# Patient Record
Sex: Female | Born: 1945
Health system: Southern US, Community
[De-identification: ages and names within clinical notes are randomized; demographics above are authoritative.]

## PROBLEM LIST (undated history)

## (undated) DIAGNOSIS — C439 Malignant melanoma of skin, unspecified: Secondary | ICD-10-CM

## (undated) DIAGNOSIS — R918 Other nonspecific abnormal finding of lung field: Secondary | ICD-10-CM

## (undated) DIAGNOSIS — S2239XA Fracture of one rib, unspecified side, initial encounter for closed fracture: Secondary | ICD-10-CM

## (undated) DIAGNOSIS — Z8679 Personal history of other diseases of the circulatory system: Secondary | ICD-10-CM

## (undated) DIAGNOSIS — I10 Essential (primary) hypertension: Secondary | ICD-10-CM

## (undated) DIAGNOSIS — M949 Disorder of cartilage, unspecified: Secondary | ICD-10-CM

## (undated) DIAGNOSIS — M545 Low back pain: Secondary | ICD-10-CM

## (undated) DIAGNOSIS — M79671 Pain in right foot: Secondary | ICD-10-CM

## (undated) DIAGNOSIS — B262 Mumps encephalitis: Secondary | ICD-10-CM

## (undated) DIAGNOSIS — E876 Hypokalemia: Secondary | ICD-10-CM

## (undated) DIAGNOSIS — C50919 Malignant neoplasm of unspecified site of unspecified female breast: Secondary | ICD-10-CM

## (undated) DIAGNOSIS — R748 Abnormal levels of other serum enzymes: Secondary | ICD-10-CM

## (undated) DIAGNOSIS — G4700 Insomnia, unspecified: Secondary | ICD-10-CM

## (undated) DIAGNOSIS — Z9189 Other specified personal risk factors, not elsewhere classified: Secondary | ICD-10-CM

## (undated) DIAGNOSIS — Z8619 Personal history of other infectious and parasitic diseases: Secondary | ICD-10-CM

## (undated) DIAGNOSIS — M899 Disorder of bone, unspecified: Secondary | ICD-10-CM

## (undated) DIAGNOSIS — G43009 Migraine without aura, not intractable, without status migrainosus: Secondary | ICD-10-CM

## (undated) DIAGNOSIS — Z853 Personal history of malignant neoplasm of breast: Secondary | ICD-10-CM

## (undated) DIAGNOSIS — M79672 Pain in left foot: Secondary | ICD-10-CM

## (undated) DIAGNOSIS — IMO0002 Reserved for concepts with insufficient information to code with codable children: Secondary | ICD-10-CM

## (undated) HISTORY — PX: TONSILLECTOMY: SHX5217

## (undated) HISTORY — PX: SHOULDER ARTHROSCOPY: SHX128

## (undated) HISTORY — DX: Mumps encephalitis: B26.2

## (undated) HISTORY — DX: Essential (primary) hypertension: I10

## (undated) HISTORY — DX: Personal history of other infectious and parasitic diseases: Z86.19

## (undated) HISTORY — DX: Other nonspecific abnormal finding of lung field: R91.8

## (undated) HISTORY — DX: Insomnia, unspecified: G47.00

## (undated) HISTORY — DX: Low back pain: M54.5

## (undated) HISTORY — DX: Malignant melanoma of skin, unspecified: C43.9

## (undated) HISTORY — DX: Pain in right foot: M79.671

## (undated) HISTORY — DX: Fracture of one rib, unspecified side, initial encounter for closed fracture: S22.39XA

## (undated) HISTORY — DX: Hypokalemia: E87.6

## (undated) HISTORY — PX: OTHER SURGICAL HISTORY: SHX169

## (undated) HISTORY — DX: Disorder of bone, unspecified: M89.9

## (undated) HISTORY — DX: Malignant neoplasm of unspecified site of unspecified female breast: C50.919

## (undated) HISTORY — DX: Personal history of malignant neoplasm of breast: Z85.3

## (undated) HISTORY — DX: Disorder of cartilage, unspecified: M94.9

## (undated) HISTORY — DX: Pain in left foot: M79.672

## (undated) HISTORY — DX: Abnormal levels of other serum enzymes: R74.8

## (undated) HISTORY — DX: Other specified personal risk factors, not elsewhere classified: Z91.89

## (undated) HISTORY — DX: Personal history of other diseases of the circulatory system: Z86.79

## (undated) HISTORY — DX: Reserved for concepts with insufficient information to code with codable children: IMO0002

## (undated) HISTORY — DX: Migraine without aura, not intractable, without status migrainosus: G43.009

---

## 1997-12-09 ENCOUNTER — Other Ambulatory Visit: Admission: RE | Admit: 1997-12-09 | Discharge: 1997-12-09 | Payer: Self-pay | Admitting: Gynecology

## 1998-12-17 ENCOUNTER — Other Ambulatory Visit: Admission: RE | Admit: 1998-12-17 | Discharge: 1998-12-17 | Payer: Self-pay | Admitting: Gynecology

## 1999-12-20 ENCOUNTER — Other Ambulatory Visit: Admission: RE | Admit: 1999-12-20 | Discharge: 1999-12-20 | Payer: Self-pay | Admitting: Gynecology

## 2001-02-14 ENCOUNTER — Other Ambulatory Visit: Admission: RE | Admit: 2001-02-14 | Discharge: 2001-02-14 | Payer: Self-pay | Admitting: Gynecology

## 2002-04-10 ENCOUNTER — Other Ambulatory Visit: Admission: RE | Admit: 2002-04-10 | Discharge: 2002-04-10 | Payer: Self-pay | Admitting: Gynecology

## 2003-04-30 ENCOUNTER — Other Ambulatory Visit: Admission: RE | Admit: 2003-04-30 | Discharge: 2003-04-30 | Payer: Self-pay | Admitting: Gynecology

## 2004-07-14 ENCOUNTER — Other Ambulatory Visit: Admission: RE | Admit: 2004-07-14 | Discharge: 2004-07-14 | Payer: Self-pay | Admitting: Gynecology

## 2004-08-06 DIAGNOSIS — C50919 Malignant neoplasm of unspecified site of unspecified female breast: Secondary | ICD-10-CM

## 2004-08-06 HISTORY — DX: Malignant neoplasm of unspecified site of unspecified female breast: C50.919

## 2004-08-06 HISTORY — PX: BREAST LUMPECTOMY WITH NEEDLE LOCALIZATION AND AXILLARY SENTINEL LYMPH NODE BX: SHX5760

## 2004-08-11 ENCOUNTER — Encounter: Admission: RE | Admit: 2004-08-11 | Discharge: 2004-08-11 | Payer: Self-pay | Admitting: Radiology

## 2004-08-23 ENCOUNTER — Ambulatory Visit (HOSPITAL_BASED_OUTPATIENT_CLINIC_OR_DEPARTMENT_OTHER): Admission: RE | Admit: 2004-08-23 | Discharge: 2004-08-23 | Payer: Self-pay | Admitting: General Surgery

## 2004-08-23 ENCOUNTER — Encounter: Admission: RE | Admit: 2004-08-23 | Discharge: 2004-08-23 | Payer: Self-pay | Admitting: General Surgery

## 2004-08-23 ENCOUNTER — Encounter (INDEPENDENT_AMBULATORY_CARE_PROVIDER_SITE_OTHER): Payer: Self-pay | Admitting: Specialist

## 2004-08-23 ENCOUNTER — Ambulatory Visit (HOSPITAL_COMMUNITY): Admission: RE | Admit: 2004-08-23 | Discharge: 2004-08-23 | Payer: Self-pay | Admitting: General Surgery

## 2004-08-31 ENCOUNTER — Ambulatory Visit: Payer: Self-pay | Admitting: Oncology

## 2004-09-14 ENCOUNTER — Ambulatory Visit: Admission: RE | Admit: 2004-09-14 | Discharge: 2004-11-05 | Payer: Self-pay | Admitting: General Surgery

## 2004-09-28 ENCOUNTER — Ambulatory Visit (HOSPITAL_COMMUNITY): Admission: RE | Admit: 2004-09-28 | Discharge: 2004-09-28 | Payer: Self-pay | Admitting: Oncology

## 2004-10-03 ENCOUNTER — Ambulatory Visit (HOSPITAL_COMMUNITY): Admission: RE | Admit: 2004-10-03 | Discharge: 2004-10-03 | Payer: Self-pay | Admitting: Surgery

## 2004-10-06 ENCOUNTER — Ambulatory Visit (HOSPITAL_COMMUNITY): Admission: RE | Admit: 2004-10-06 | Discharge: 2004-10-06 | Payer: Self-pay | Admitting: Radiation Oncology

## 2004-10-17 ENCOUNTER — Ambulatory Visit: Payer: Self-pay | Admitting: Oncology

## 2004-10-24 ENCOUNTER — Ambulatory Visit (HOSPITAL_COMMUNITY): Admission: RE | Admit: 2004-10-24 | Discharge: 2004-10-24 | Payer: Self-pay | Admitting: Oncology

## 2004-12-05 ENCOUNTER — Ambulatory Visit: Payer: Self-pay | Admitting: Oncology

## 2004-12-06 ENCOUNTER — Encounter: Admission: RE | Admit: 2004-12-06 | Discharge: 2004-12-06 | Payer: Self-pay | Admitting: Oncology

## 2005-01-11 ENCOUNTER — Ambulatory Visit (HOSPITAL_COMMUNITY): Admission: RE | Admit: 2005-01-11 | Discharge: 2005-01-11 | Payer: Self-pay | Admitting: Oncology

## 2005-02-02 ENCOUNTER — Ambulatory Visit: Payer: Self-pay | Admitting: Oncology

## 2005-03-06 ENCOUNTER — Encounter: Admission: RE | Admit: 2005-03-06 | Discharge: 2005-03-06 | Payer: Self-pay | Admitting: General Surgery

## 2005-04-06 ENCOUNTER — Ambulatory Visit (HOSPITAL_COMMUNITY): Admission: RE | Admit: 2005-04-06 | Discharge: 2005-04-06 | Payer: Self-pay | Admitting: Oncology

## 2005-04-06 ENCOUNTER — Ambulatory Visit: Payer: Self-pay | Admitting: Oncology

## 2005-05-09 ENCOUNTER — Ambulatory Visit (HOSPITAL_COMMUNITY): Admission: RE | Admit: 2005-05-09 | Discharge: 2005-05-09 | Payer: Self-pay | Admitting: Oncology

## 2005-06-14 ENCOUNTER — Ambulatory Visit: Payer: Self-pay | Admitting: Oncology

## 2005-06-16 LAB — COMPREHENSIVE METABOLIC PANEL
ALT: 14 U/L (ref 0–40)
Albumin: 4.5 g/dL (ref 3.5–5.2)
CO2: 29 mEq/L (ref 19–32)
Chloride: 103 mEq/L (ref 96–112)
Glucose, Bld: 86 mg/dL (ref 70–99)
Potassium: 4.5 mEq/L (ref 3.5–5.3)
Sodium: 141 mEq/L (ref 135–145)
Total Bilirubin: 0.6 mg/dL (ref 0.3–1.2)
Total Protein: 6.5 g/dL (ref 6.0–8.3)

## 2005-06-16 LAB — CBC WITH DIFFERENTIAL/PLATELET
EOS%: 1.4 % (ref 0.0–7.0)
Eosinophils Absolute: 0.1 10*3/uL (ref 0.0–0.5)
MCH: 35.2 pg — ABNORMAL HIGH (ref 26.0–34.0)
MCV: 101.1 fL — ABNORMAL HIGH (ref 81.0–101.0)
MONO%: 6.3 % (ref 0.0–13.0)
NEUT#: 2.5 10*3/uL (ref 1.5–6.5)
RBC: 3.98 10*6/uL (ref 3.70–5.32)
RDW: 13.5 % (ref 11.3–14.5)
lymph#: 1.1 10*3/uL (ref 0.9–3.3)

## 2005-06-16 LAB — CANCER ANTIGEN 27.29: CA 27.29: 20 U/mL (ref 0–39)

## 2005-08-10 ENCOUNTER — Other Ambulatory Visit: Admission: RE | Admit: 2005-08-10 | Discharge: 2005-08-10 | Payer: Self-pay | Admitting: Gynecology

## 2005-10-12 ENCOUNTER — Ambulatory Visit: Payer: Self-pay | Admitting: Oncology

## 2005-10-13 LAB — CBC WITH DIFFERENTIAL/PLATELET
BASO%: 0.5 % (ref 0.0–2.0)
Basophils Absolute: 0 10*3/uL (ref 0.0–0.1)
EOS%: 1.8 % (ref 0.0–7.0)
Eosinophils Absolute: 0.1 10*3/uL (ref 0.0–0.5)
HCT: 39.4 % (ref 34.8–46.6)
HGB: 13.9 g/dL (ref 11.6–15.9)
LYMPH%: 27.5 % (ref 14.0–48.0)
MCH: 35.6 pg — ABNORMAL HIGH (ref 26.0–34.0)
MCHC: 35.3 g/dL (ref 32.0–36.0)
MCV: 100.6 fL (ref 81.0–101.0)
MONO#: 0.2 10*3/uL (ref 0.1–0.9)
MONO%: 5.7 % (ref 0.0–13.0)
NEUT#: 2.5 10*3/uL (ref 1.5–6.5)
NEUT%: 64.5 % (ref 39.6–76.8)
Platelets: 189 10*3/uL (ref 145–400)
RBC: 3.92 10*6/uL (ref 3.70–5.32)
RDW: 12.7 % (ref 11.3–14.5)
WBC: 3.9 10*3/uL (ref 3.9–10.0)
lymph#: 1.1 10*3/uL (ref 0.9–3.3)

## 2005-10-13 LAB — COMPREHENSIVE METABOLIC PANEL
ALT: 10 U/L (ref 0–40)
AST: 17 U/L (ref 0–37)
Albumin: 4.4 g/dL (ref 3.5–5.2)
Alkaline Phosphatase: 158 U/L — ABNORMAL HIGH (ref 39–117)
BUN: 20 mg/dL (ref 6–23)
CO2: 29 mEq/L (ref 19–32)
Calcium: 9 mg/dL (ref 8.4–10.5)
Chloride: 107 mEq/L (ref 96–112)
Creatinine, Ser: 0.7 mg/dL (ref 0.40–1.20)
Glucose, Bld: 94 mg/dL (ref 70–99)
Potassium: 4.2 mEq/L (ref 3.5–5.3)
Sodium: 141 mEq/L (ref 135–145)
Total Bilirubin: 0.6 mg/dL (ref 0.3–1.2)
Total Protein: 6.3 g/dL (ref 6.0–8.3)

## 2005-10-13 LAB — CANCER ANTIGEN 27.29: CA 27.29: 23 U/mL (ref 0–39)

## 2006-01-01 ENCOUNTER — Ambulatory Visit (HOSPITAL_COMMUNITY): Admission: RE | Admit: 2006-01-01 | Discharge: 2006-01-01 | Payer: Self-pay | Admitting: Oncology

## 2006-01-01 ENCOUNTER — Encounter: Payer: Self-pay | Admitting: Vascular Surgery

## 2006-01-02 ENCOUNTER — Ambulatory Visit: Payer: Self-pay | Admitting: Oncology

## 2006-01-05 LAB — CBC WITH DIFFERENTIAL/PLATELET
Basophils Absolute: 0 10*3/uL (ref 0.0–0.1)
Eosinophils Absolute: 0.1 10*3/uL (ref 0.0–0.5)
HGB: 13.8 g/dL (ref 11.6–15.9)
LYMPH%: 26.5 % (ref 14.0–48.0)
MCV: 99.8 fL (ref 81.0–101.0)
MONO#: 0.2 10*3/uL (ref 0.1–0.9)
MONO%: 3.9 % (ref 0.0–13.0)
NEUT#: 3.1 10*3/uL (ref 1.5–6.5)
Platelets: 185 10*3/uL (ref 145–400)
RDW: 12.4 % (ref 11.3–14.5)
WBC: 4.7 10*3/uL (ref 3.9–10.0)

## 2006-01-05 LAB — COMPREHENSIVE METABOLIC PANEL
Albumin: 4.4 g/dL (ref 3.5–5.2)
Alkaline Phosphatase: 149 U/L — ABNORMAL HIGH (ref 39–117)
BUN: 16 mg/dL (ref 6–23)
CO2: 29 mEq/L (ref 19–32)
Glucose, Bld: 164 mg/dL — ABNORMAL HIGH (ref 70–99)
Potassium: 3.6 mEq/L (ref 3.5–5.3)
Sodium: 143 mEq/L (ref 135–145)
Total Protein: 6.5 g/dL (ref 6.0–8.3)

## 2006-01-05 LAB — CANCER ANTIGEN 27.29: CA 27.29: 22 U/mL (ref 0–39)

## 2006-02-02 ENCOUNTER — Ambulatory Visit (HOSPITAL_COMMUNITY): Admission: RE | Admit: 2006-02-02 | Discharge: 2006-02-02 | Payer: Self-pay | Admitting: Oncology

## 2006-02-12 ENCOUNTER — Encounter: Admission: RE | Admit: 2006-02-12 | Discharge: 2006-02-12 | Payer: Self-pay | Admitting: Family Medicine

## 2006-02-22 ENCOUNTER — Ambulatory Visit (HOSPITAL_COMMUNITY): Admission: RE | Admit: 2006-02-22 | Discharge: 2006-02-22 | Payer: Self-pay | Admitting: Oncology

## 2006-02-27 ENCOUNTER — Ambulatory Visit (HOSPITAL_COMMUNITY): Admission: RE | Admit: 2006-02-27 | Discharge: 2006-02-27 | Payer: Self-pay | Admitting: Oncology

## 2006-03-07 ENCOUNTER — Encounter: Admission: RE | Admit: 2006-03-07 | Discharge: 2006-03-07 | Payer: Self-pay | Admitting: Oncology

## 2006-04-10 ENCOUNTER — Ambulatory Visit: Payer: Self-pay | Admitting: Oncology

## 2006-04-13 LAB — COMPREHENSIVE METABOLIC PANEL
ALT: 11 U/L (ref 0–35)
Albumin: 4.5 g/dL (ref 3.5–5.2)
Alkaline Phosphatase: 163 U/L — ABNORMAL HIGH (ref 39–117)
Potassium: 4 mEq/L (ref 3.5–5.3)
Sodium: 142 mEq/L (ref 135–145)
Total Bilirubin: 0.7 mg/dL (ref 0.3–1.2)
Total Protein: 6.7 g/dL (ref 6.0–8.3)

## 2006-04-13 LAB — CBC WITH DIFFERENTIAL/PLATELET
BASO%: 0.5 % (ref 0.0–2.0)
Eosinophils Absolute: 0.1 10*3/uL (ref 0.0–0.5)
LYMPH%: 28.9 % (ref 14.0–48.0)
MCHC: 36.2 g/dL — ABNORMAL HIGH (ref 32.0–36.0)
MONO#: 0.2 10*3/uL (ref 0.1–0.9)
MONO%: 5.4 % (ref 0.0–13.0)
NEUT#: 2.6 10*3/uL (ref 1.5–6.5)
Platelets: 176 10*3/uL (ref 145–400)
RBC: 4.12 10*6/uL (ref 3.70–5.32)
RDW: 12.4 % (ref 11.3–14.5)
WBC: 4.1 10*3/uL (ref 3.9–10.0)

## 2006-07-10 ENCOUNTER — Ambulatory Visit: Payer: Self-pay | Admitting: Oncology

## 2006-07-13 LAB — COMPREHENSIVE METABOLIC PANEL
AST: 18 U/L (ref 0–37)
Albumin: 4.5 g/dL (ref 3.5–5.2)
Alkaline Phosphatase: 145 U/L — ABNORMAL HIGH (ref 39–117)
Calcium: 9.6 mg/dL (ref 8.4–10.5)
Chloride: 102 mEq/L (ref 96–112)
Potassium: 4 mEq/L (ref 3.5–5.3)
Sodium: 141 mEq/L (ref 135–145)
Total Protein: 6.8 g/dL (ref 6.0–8.3)

## 2006-07-13 LAB — CBC WITH DIFFERENTIAL/PLATELET
EOS%: 2 % (ref 0.0–7.0)
Eosinophils Absolute: 0.1 10*3/uL (ref 0.0–0.5)
MCH: 35.8 pg — ABNORMAL HIGH (ref 26.0–34.0)
MCV: 99 fL (ref 81.0–101.0)
MONO%: 4.9 % (ref 0.0–13.0)
NEUT#: 2.7 10*3/uL (ref 1.5–6.5)
RBC: 4.13 10*6/uL (ref 3.70–5.32)
RDW: 12.6 % (ref 11.3–14.5)
lymph#: 1.3 10*3/uL (ref 0.9–3.3)

## 2006-08-27 ENCOUNTER — Other Ambulatory Visit: Admission: RE | Admit: 2006-08-27 | Discharge: 2006-08-27 | Payer: Self-pay | Admitting: Gynecology

## 2006-11-15 ENCOUNTER — Ambulatory Visit: Payer: Self-pay | Admitting: Oncology

## 2006-11-19 LAB — CBC WITH DIFFERENTIAL/PLATELET
BASO%: 0.5 % (ref 0.0–2.0)
EOS%: 2.4 % (ref 0.0–7.0)
HCT: 39.3 % (ref 34.8–46.6)
LYMPH%: 28.9 % (ref 14.0–48.0)
MCH: 36 pg — ABNORMAL HIGH (ref 26.0–34.0)
MCHC: 36.1 g/dL — ABNORMAL HIGH (ref 32.0–36.0)
MCV: 99.7 fL (ref 81.0–101.0)
MONO#: 0.2 10*3/uL (ref 0.1–0.9)
MONO%: 5.1 % (ref 0.0–13.0)
NEUT%: 63.1 % (ref 39.6–76.8)
Platelets: 175 10*3/uL (ref 145–400)

## 2006-11-19 LAB — COMPREHENSIVE METABOLIC PANEL
ALT: 14 U/L (ref 0–35)
AST: 16 U/L (ref 0–37)
Albumin: 4.5 g/dL (ref 3.5–5.2)
Alkaline Phosphatase: 129 U/L — ABNORMAL HIGH (ref 39–117)
Calcium: 9 mg/dL (ref 8.4–10.5)
Chloride: 104 mEq/L (ref 96–112)
Potassium: 3.7 mEq/L (ref 3.5–5.3)
Sodium: 141 mEq/L (ref 135–145)
Total Protein: 6.6 g/dL (ref 6.0–8.3)

## 2007-02-07 LAB — HM COLONOSCOPY

## 2007-03-07 ENCOUNTER — Ambulatory Visit: Payer: Self-pay | Admitting: Oncology

## 2007-03-11 ENCOUNTER — Encounter: Admission: RE | Admit: 2007-03-11 | Discharge: 2007-03-11 | Payer: Self-pay | Admitting: Oncology

## 2007-03-11 LAB — CBC WITH DIFFERENTIAL/PLATELET
BASO%: 0.5 % (ref 0.0–2.0)
EOS%: 1.4 % (ref 0.0–7.0)
HCT: 42.8 % (ref 34.8–46.6)
LYMPH%: 25.7 % (ref 14.0–48.0)
MCH: 33.5 pg (ref 26.0–34.0)
MCHC: 33.9 g/dL (ref 32.0–36.0)
MCV: 99 fL (ref 81.0–101.0)
MONO%: 4.6 % (ref 0.0–13.0)
NEUT%: 67.8 % (ref 39.6–76.8)
Platelets: 200 10*3/uL (ref 145–400)

## 2007-03-11 LAB — COMPREHENSIVE METABOLIC PANEL
ALT: 13 U/L (ref 0–35)
AST: 18 U/L (ref 0–37)
BUN: 12 mg/dL (ref 6–23)
Creatinine, Ser: 0.69 mg/dL (ref 0.40–1.20)
Total Bilirubin: 0.6 mg/dL (ref 0.3–1.2)

## 2007-07-04 ENCOUNTER — Ambulatory Visit: Payer: Self-pay | Admitting: Oncology

## 2007-07-09 LAB — CBC WITH DIFFERENTIAL/PLATELET
BASO%: 0.3 % (ref 0.0–2.0)
LYMPH%: 16.1 % (ref 14.0–48.0)
MCHC: 35.4 g/dL (ref 32.0–36.0)
MONO#: 0.3 10*3/uL (ref 0.1–0.9)
Platelets: 154 10*3/uL (ref 145–400)
RBC: 3.89 10*6/uL (ref 3.70–5.32)
WBC: 6 10*3/uL (ref 3.9–10.0)

## 2007-07-10 LAB — COMPREHENSIVE METABOLIC PANEL
ALT: 11 U/L (ref 0–35)
Alkaline Phosphatase: 117 U/L (ref 39–117)
CO2: 29 mEq/L (ref 19–32)
Sodium: 144 mEq/L (ref 135–145)
Total Bilirubin: 0.7 mg/dL (ref 0.3–1.2)
Total Protein: 6.6 g/dL (ref 6.0–8.3)

## 2007-07-10 LAB — LACTATE DEHYDROGENASE: LDH: 124 U/L (ref 94–250)

## 2007-09-24 ENCOUNTER — Other Ambulatory Visit: Admission: RE | Admit: 2007-09-24 | Discharge: 2007-09-24 | Payer: Self-pay | Admitting: Gynecology

## 2007-09-26 ENCOUNTER — Encounter (INDEPENDENT_AMBULATORY_CARE_PROVIDER_SITE_OTHER): Payer: Self-pay | Admitting: Orthopedic Surgery

## 2007-09-26 ENCOUNTER — Ambulatory Visit (HOSPITAL_BASED_OUTPATIENT_CLINIC_OR_DEPARTMENT_OTHER): Admission: RE | Admit: 2007-09-26 | Discharge: 2007-09-26 | Payer: Self-pay | Admitting: Orthopedic Surgery

## 2007-10-18 ENCOUNTER — Encounter: Admission: RE | Admit: 2007-10-18 | Discharge: 2007-10-18 | Payer: Self-pay | Admitting: Oncology

## 2007-10-18 ENCOUNTER — Ambulatory Visit: Payer: Self-pay | Admitting: Oncology

## 2008-01-06 ENCOUNTER — Ambulatory Visit: Payer: Self-pay | Admitting: Oncology

## 2008-01-08 LAB — CBC WITH DIFFERENTIAL/PLATELET
BASO%: 0.5 % (ref 0.0–2.0)
Eosinophils Absolute: 0.1 10*3/uL (ref 0.0–0.5)
LYMPH%: 25 % (ref 14.0–48.0)
MCHC: 35.4 g/dL (ref 32.0–36.0)
MONO#: 0.3 10*3/uL (ref 0.1–0.9)
MONO%: 5.9 % (ref 0.0–13.0)
NEUT#: 3.6 10*3/uL (ref 1.5–6.5)
RBC: 4 10*6/uL (ref 3.70–5.32)
RDW: 12.6 % (ref 11.3–14.5)
WBC: 5.4 10*3/uL (ref 3.9–10.0)

## 2008-01-09 LAB — COMPREHENSIVE METABOLIC PANEL
ALT: 14 U/L (ref 0–35)
Albumin: 4.5 g/dL (ref 3.5–5.2)
Alkaline Phosphatase: 88 U/L (ref 39–117)
Glucose, Bld: 102 mg/dL — ABNORMAL HIGH (ref 70–99)
Potassium: 3.5 mEq/L (ref 3.5–5.3)
Sodium: 141 mEq/L (ref 135–145)
Total Bilirubin: 0.7 mg/dL (ref 0.3–1.2)
Total Protein: 6.4 g/dL (ref 6.0–8.3)

## 2008-01-09 LAB — CANCER ANTIGEN 27.29: CA 27.29: 20 U/mL (ref 0–39)

## 2008-03-11 ENCOUNTER — Encounter: Admission: RE | Admit: 2008-03-11 | Discharge: 2008-03-11 | Payer: Self-pay | Admitting: Oncology

## 2008-03-16 ENCOUNTER — Ambulatory Visit: Payer: Self-pay | Admitting: Oncology

## 2008-03-18 ENCOUNTER — Ambulatory Visit (HOSPITAL_COMMUNITY): Admission: RE | Admit: 2008-03-18 | Discharge: 2008-03-18 | Payer: Self-pay | Admitting: Oncology

## 2008-07-02 ENCOUNTER — Ambulatory Visit: Payer: Self-pay | Admitting: Oncology

## 2008-07-07 LAB — CBC WITH DIFFERENTIAL/PLATELET
BASO%: 0.4 % (ref 0.0–2.0)
Basophils Absolute: 0 10*3/uL (ref 0.0–0.1)
EOS%: 0.7 % (ref 0.0–7.0)
HGB: 14 g/dL (ref 11.6–15.9)
MCH: 35.2 pg — ABNORMAL HIGH (ref 25.1–34.0)
RDW: 12.6 % (ref 11.2–14.5)
WBC: 5 10*3/uL (ref 3.9–10.3)
lymph#: 1.7 10*3/uL (ref 0.9–3.3)

## 2008-07-07 LAB — COMPREHENSIVE METABOLIC PANEL
ALT: 13 U/L (ref 0–35)
AST: 19 U/L (ref 0–37)
Albumin: 4.1 g/dL (ref 3.5–5.2)
Calcium: 8.9 mg/dL (ref 8.4–10.5)
Chloride: 100 mEq/L (ref 96–112)
Potassium: 3.4 mEq/L — ABNORMAL LOW (ref 3.5–5.3)

## 2008-07-08 LAB — CANCER ANTIGEN 27.29: CA 27.29: 25 U/mL (ref 0–39)

## 2008-08-25 ENCOUNTER — Encounter: Admission: RE | Admit: 2008-08-25 | Discharge: 2008-08-25 | Payer: Self-pay | Admitting: Family Medicine

## 2008-10-21 ENCOUNTER — Ambulatory Visit: Payer: Self-pay | Admitting: Oncology

## 2009-01-11 ENCOUNTER — Ambulatory Visit: Payer: Self-pay | Admitting: Oncology

## 2009-01-13 LAB — CBC WITH DIFFERENTIAL/PLATELET
Basophils Absolute: 0 10*3/uL (ref 0.0–0.1)
HCT: 41.7 % (ref 34.8–46.6)
HGB: 14.4 g/dL (ref 11.6–15.9)
MONO#: 0.3 10*3/uL (ref 0.1–0.9)
NEUT%: 65.9 % (ref 38.4–76.8)
Platelets: 198 10*3/uL (ref 145–400)
WBC: 5.5 10*3/uL (ref 3.9–10.3)
lymph#: 1.5 10*3/uL (ref 0.9–3.3)

## 2009-01-14 LAB — COMPREHENSIVE METABOLIC PANEL
BUN: 26 mg/dL — ABNORMAL HIGH (ref 6–23)
CO2: 28 mEq/L (ref 19–32)
Calcium: 9.7 mg/dL (ref 8.4–10.5)
Chloride: 104 mEq/L (ref 96–112)
Creatinine, Ser: 0.67 mg/dL (ref 0.40–1.20)
Glucose, Bld: 81 mg/dL (ref 70–99)

## 2009-01-14 LAB — VITAMIN D 25 HYDROXY (VIT D DEFICIENCY, FRACTURES): Vit D, 25-Hydroxy: 53 ng/mL (ref 30–89)

## 2009-01-14 LAB — LACTATE DEHYDROGENASE: LDH: 152 U/L (ref 94–250)

## 2009-01-14 LAB — CANCER ANTIGEN 27.29: CA 27.29: 24 U/mL (ref 0–39)

## 2009-02-19 ENCOUNTER — Encounter: Admission: RE | Admit: 2009-02-19 | Discharge: 2009-02-19 | Payer: Self-pay | Admitting: Family Medicine

## 2009-03-09 LAB — HM MAMMOGRAPHY

## 2009-03-12 ENCOUNTER — Encounter: Admission: RE | Admit: 2009-03-12 | Discharge: 2009-03-12 | Payer: Self-pay | Admitting: Oncology

## 2009-06-18 ENCOUNTER — Encounter: Admission: RE | Admit: 2009-06-18 | Discharge: 2009-06-18 | Payer: Self-pay | Admitting: Family Medicine

## 2009-07-07 LAB — CONVERTED CEMR LAB

## 2009-07-14 ENCOUNTER — Ambulatory Visit: Payer: Self-pay | Admitting: Oncology

## 2009-07-16 LAB — CBC WITH DIFFERENTIAL/PLATELET
EOS%: 1.4 % (ref 0.0–7.0)
HCT: 37 % (ref 34.8–46.6)
HGB: 13.2 g/dL (ref 11.6–15.9)
MCH: 35.8 pg — ABNORMAL HIGH (ref 25.1–34.0)
MCHC: 35.7 g/dL (ref 31.5–36.0)
MCV: 100.4 fL (ref 79.5–101.0)
MONO#: 0.3 10*3/uL (ref 0.1–0.9)
MONO%: 5.4 % (ref 0.0–14.0)
NEUT#: 3 10*3/uL (ref 1.5–6.5)
NEUT%: 63.5 % (ref 38.4–76.8)
Platelets: 169 10*3/uL (ref 145–400)
RBC: 3.69 10*6/uL — ABNORMAL LOW (ref 3.70–5.45)
RDW: 13.1 % (ref 11.2–14.5)
WBC: 4.8 10*3/uL (ref 3.9–10.3)

## 2009-07-16 LAB — COMPREHENSIVE METABOLIC PANEL
CO2: 28 mEq/L (ref 19–32)
Calcium: 9.3 mg/dL (ref 8.4–10.5)
Creatinine, Ser: 0.76 mg/dL (ref 0.40–1.20)
Glucose, Bld: 120 mg/dL — ABNORMAL HIGH (ref 70–99)
Sodium: 143 mEq/L (ref 135–145)
Total Protein: 6.2 g/dL (ref 6.0–8.3)

## 2009-07-16 LAB — LACTATE DEHYDROGENASE: LDH: 131 U/L (ref 94–250)

## 2009-08-16 ENCOUNTER — Encounter: Payer: Self-pay | Admitting: Family Medicine

## 2010-02-08 ENCOUNTER — Ambulatory Visit
Admission: RE | Admit: 2010-02-08 | Discharge: 2010-02-08 | Payer: Self-pay | Source: Home / Self Care | Attending: Family Medicine | Admitting: Family Medicine

## 2010-02-24 ENCOUNTER — Other Ambulatory Visit: Payer: Self-pay | Admitting: Oncology

## 2010-02-24 DIAGNOSIS — C50919 Malignant neoplasm of unspecified site of unspecified female breast: Secondary | ICD-10-CM

## 2010-02-26 ENCOUNTER — Other Ambulatory Visit: Payer: Self-pay | Admitting: Oncology

## 2010-02-26 DIAGNOSIS — N63 Unspecified lump in unspecified breast: Secondary | ICD-10-CM

## 2010-02-28 ENCOUNTER — Telehealth: Payer: Self-pay | Admitting: Family Medicine

## 2010-03-03 ENCOUNTER — Ambulatory Visit
Admission: RE | Admit: 2010-03-03 | Discharge: 2010-03-03 | Payer: Self-pay | Source: Home / Self Care | Attending: Family Medicine | Admitting: Family Medicine

## 2010-03-03 ENCOUNTER — Encounter: Payer: Self-pay | Admitting: Family Medicine

## 2010-03-03 DIAGNOSIS — E876 Hypokalemia: Secondary | ICD-10-CM

## 2010-03-03 DIAGNOSIS — Z8679 Personal history of other diseases of the circulatory system: Secondary | ICD-10-CM

## 2010-03-03 DIAGNOSIS — IMO0002 Reserved for concepts with insufficient information to code with codable children: Secondary | ICD-10-CM | POA: Insufficient documentation

## 2010-03-03 DIAGNOSIS — M949 Disorder of cartilage, unspecified: Secondary | ICD-10-CM

## 2010-03-03 DIAGNOSIS — G43009 Migraine without aura, not intractable, without status migrainosus: Secondary | ICD-10-CM | POA: Insufficient documentation

## 2010-03-03 DIAGNOSIS — C439 Malignant melanoma of skin, unspecified: Secondary | ICD-10-CM | POA: Insufficient documentation

## 2010-03-03 DIAGNOSIS — Z8619 Personal history of other infectious and parasitic diseases: Secondary | ICD-10-CM | POA: Insufficient documentation

## 2010-03-03 DIAGNOSIS — B262 Mumps encephalitis: Secondary | ICD-10-CM | POA: Insufficient documentation

## 2010-03-03 DIAGNOSIS — Z9189 Other specified personal risk factors, not elsewhere classified: Secondary | ICD-10-CM | POA: Insufficient documentation

## 2010-03-03 DIAGNOSIS — M899 Disorder of bone, unspecified: Secondary | ICD-10-CM | POA: Insufficient documentation

## 2010-03-03 DIAGNOSIS — I10 Essential (primary) hypertension: Secondary | ICD-10-CM | POA: Insufficient documentation

## 2010-03-03 DIAGNOSIS — Z853 Personal history of malignant neoplasm of breast: Secondary | ICD-10-CM

## 2010-03-03 HISTORY — DX: Reserved for concepts with insufficient information to code with codable children: IMO0002

## 2010-03-03 HISTORY — DX: Mumps encephalitis: B26.2

## 2010-03-03 HISTORY — DX: Migraine without aura, not intractable, without status migrainosus: G43.009

## 2010-03-03 HISTORY — DX: Essential (primary) hypertension: I10

## 2010-03-03 HISTORY — DX: Personal history of other diseases of the circulatory system: Z86.79

## 2010-03-03 HISTORY — DX: Other specified personal risk factors, not elsewhere classified: Z91.89

## 2010-03-03 HISTORY — DX: Malignant melanoma of skin, unspecified: C43.9

## 2010-03-03 HISTORY — DX: Personal history of malignant neoplasm of breast: Z85.3

## 2010-03-03 HISTORY — DX: Disorder of bone, unspecified: M89.9

## 2010-03-03 HISTORY — DX: Hypokalemia: E87.6

## 2010-03-03 HISTORY — DX: Personal history of other infectious and parasitic diseases: Z86.19

## 2010-03-09 ENCOUNTER — Encounter: Payer: Self-pay | Admitting: *Deleted

## 2010-03-10 NOTE — Letter (Signed)
Summary: Ursula Beath MD/[facility]  Ursula Beath MD/[facility]   Imported By: Lester  02/25/2010 08:13:52  _____________________________________________________________________  External Attachment:    Type:   Image     Comment:   External Document

## 2010-03-10 NOTE — Assessment & Plan Note (Signed)
Summary: refill on HCTZ/VFW   Vital Signs:  Patient profile:   65 year old [name] Height:      65 inches (165.10 cm) Weight:      123 pounds (55.91 kg) BMI:     20.Carrie O2 Sat:      93 % on Room air Temp:     97.7 degrees F (36.50 degrees C) oral Pulse rate:   62 / minute BP sitting:   120 / 75  (right arm) Cuff size:   regular  Vitals Entered By: Josph Macho RMA (March 03, 2010 9:15 AM)  O2 Flow:  Room air CC: Establish new pt/ refill HCTZ/ CF Is Patient Diabetic? No   History of Present Illness: Carrie [name] here. She is struggling consequences of a diagnosed as above breast cancer. Underwent internal radiation to the left breast once the cancer was confirmed and then suffered 3 pathologic fractures to left ribs while weightlifting after that that is slowly improving but now she is struggling with some stinging in her left nipple is well. She has been placed on gabapentin by her oncologist Dr. Donnie Coffin she believes that is decreased stinging some pain she denies any she knew lesions discharge skin changes in that region. She is to have an MRI and a mammogram in the next 2 weeks and is thankful for that we'll follow her oncologist once those results are available. Tylenol she does come to that diagnosis well. Other complaints are largely musculoskeletal she's had a lot of trouble with some injuries over the years she stressed that I couldn't broken ribs on the right side as well has a lot of trouble her shoulders had shoulder surgery on the right thousand one for some arthritis in neck shoulders completely recovered but unfortunately her left shoulder is bone-on-bone in her bowel movements has a bone spur and if she has been told she does shoulder replacement. She is hesitant to undergo full shoulder replacement but would instead prefer arthroscopic and possible cleaning of the shoulder is a first attempt initially she responded to cortisone but she is no longer responding to  cortisone. She sees Dr. Karlyn Agee for annual skin mapping she had DT shot on September 03, 2008 teslas the Zostavax on September 03, 2008 with Dr. Kelli Churn her previous PMD records are reviewed. She had bone densitometry in February 2010 lab and had been maintained on Fosamax due to her exposure to Arimidex in her breast cancer diagnosis. She was told by her oncologist that her bone strength was improving and she is unsure if she wants to stay on the Fosamax, she has been on it for roughly 5 years. No recent illness, f,c, n,v anorexia, congestion, CP, palp, SOB, Gu or GI c/o.   Preventive Screening-Counseling & Management  Alcohol-Tobacco     Smoking Status: quit  Caffeine-Diet-Exercise     Does Patient Exercise: yes      Drug Use:  no.    Current Problems (verified): 1)  Osteopenia  (ICD-733.90) 2)  Palpitations, Hx of  (ICD-V12.50) 3)  Hypokalemia  (ICD-276.8) 4)  Essential Hypertension, Benign  (ICD-401.1) 5)  Neoplasm, Malignant, Breast, Hx of  (ICD-V10.3) 6)  Degenerative Disc Disease  (ICD-722.6) 7)  Melanoma of Skin, Site Unspecified  (ICD-172.9) 8)  Common Migraine  (ICD-346.10) 9)  Measles, Hx of  (ICD-V12.09) 10)  Chickenpox, Hx of  (ICD-V15.9) 11)  Mumps Encephalitis  (ICD-072.2)  Current Medications (verified): 1)  Atinorol .... 1/2 Tab Once Daily 2)  Fosamax 35  Mg Tabs (Alendronate Sodium) .Marland Kitchen.. 1 Weekly 3)  Hydrochlorothiazide 25 Mg Tabs (Hydrochlorothiazide) .... 1/2 Tab Daily 4)  Protonix 40 Mg Tbec (Pantoprazole Sodium) .... Once Daily 5)  B-50  Tabs (Vitamins-Lipotropics) .Marland Kitchen.. 1 Daily 6)  Vitamin C 500 .... Once Daily 7)  Vitamin A 15,000 .... Once Daily 8)  Vitamin D 400 Iu .... Once Daily 9)  Vitamin E 400 Iu .... Once Daily 10)  Glucosomine .... Once Daily 11)  Condrointin .... Once Daily 12)  Fish Oil Maximum Strength 1200 Mg Caps (Omega-3 Fatty Acids) .... Once Daily  Allergies (verified): 1)  ! Demerol 2)  ! Vicodin 3)  ! Codeine  Past History:  Past Surgical  History: Tonsillectomy age 6 yr right clavicle dislocate, reapproximated with pins, subsequently removed Left hip traumatic history, hematoma surgically evacuated left ribs 3 fractures s/p radiation right side 2 ribs fractured, no surgery  Family History: Father: deceased@87 , old age, h/o alcohol abuse, pacer, CAD, previous smoker Mother: 23, HTN, COPD, previous smoker Siblings:  Brother: 83, OA knees Sister: 73, BCC Sister: 77, Migraines, OA knees MGM: deceased in 66s or 31s, heart disease MGF: deceased in late 22s, stroke PGM: deceased, unknown history PGF: deceased, unknown history Children:  Daughter: 24yo, A&W, smoker, genital warts  Social History: Occupation: Biomedical scientist, Buyer, retail Regular exercise-yes, [employer], treadmill, yoga Former Smoker, h/o light, social, quit in late 20s Alcohol use-yes, occasional glass of wine Drug use-no Wears seat belt No dietary restrictionsOccupation:  employed Smoking Status:  quit Does Patient Exercise:  yes Drug Use:  no  Review of Systems  The patient denies anorexia, fever, weight loss, weight gain, vision loss, decreased hearing, hoarseness, chest pain, syncope, dyspnea on exertion, peripheral edema, prolonged cough, headaches, hemoptysis, abdominal pain, melena, hematochezia, severe indigestion/heartburn, hematuria, incontinence, muscle weakness, suspicious skin lesions, transient blindness, difficulty walking, depression, unusual weight change, abnormal bleeding, and enlarged lymph nodes.         Colonoscopy 2007, reported normal  Physical Exam  General:  Well-developed,well-nourished,in no acute distress; alert,appropriate and cooperative throughout examination Head:  Normocephalic and atraumatic without obvious abnormalities. No apparent alopecia or balding. Eyes:  No corneal or conjunctival inflammation noted. EOMI. Perrla.  Ears:  External ear exam shows no significant lesions or deformities.  Otoscopic examination reveals  clear canals, tympanic membranes are intact bilaterally without bulging, retraction, inflammation or discharge. Hearing is grossly normal bilaterally. Nose:  External nasal examination shows no deformity or inflammation. Nasal mucosa are pink and moist without lesions or exudates. Mouth:  Oral mucosa and oropharynx without lesions or exudates.   Neck:  No deformities, masses, or tenderness noted. Lungs:  Normal respiratory effort, chest expands symmetrically. Lungs are clear to auscultation, no crackles or wheezes. Heart:  Normal rate and regular rhythm. S1 and S2 normal without gallop, murmur, click, rub or other extra sounds. Abdomen:  Bowel sounds positive,abdomen soft and non-tender without masses, organomegaly or hernias noted. Msk:  No deformity or scoliosis noted of thoracic or lumbar spine.   Pulses:  R and L carotid,dorsalis pedis and posterior tibial pulses are full and equal bilaterally Extremities:  No clubbing, cyanosis, edema, or deformity noted with normal full range of motion of all joints.   Neurologic:  No cranial nerve deficits noted. Station and gait are normal. Plantar reflexes are down-going bilaterally. DTRs are symmetrical throughout. Sensory, motor and coordinative functions appear intact. Skin:  Intact without suspicious lesions or rashes Cervical Nodes:  No lymphadenopathy noted Psych:  Cognition and judgment appear  intact. Alert and cooperative with normal attention span and concentration. No apparent delusions, illusions, hallucinations   Impression & Recommendations:  Problem # 1:  ESSENTIAL HYPERTENSION, BENIGN (ICD-401.1)  Her updated medication list for this problem includes:    Hydrochlorothiazide 25 Mg Tabs (Hydrochlorothiazide) .Marland Kitchen... 1/2 tab daily    Chlorthalidone 25 Mg Tabs (Chlorthalidone) .Marland Kitchen... 1/2 tab by mouth daily  Problem # 2:  NEOPLASM, MALIGNANT, BREAST, HX OF (ICD-V10.3) Following with Dr Donnie Coffin, Oncology, sees him annually in June, he follows  her with MRI, MGM, bone Densitometry and blood work.   Problem # 3:  OSTEOPENIA (ICD-733.90)  Her updated medication list for this problem includes:    Fosamax 35 Mg Tabs (Alendronate sodium) .Marland Kitchen... 1 weekly Patient has an appt with her oncologist in a couple of months, she is encouraged to discuss with them if she should continue the Fosamax at this time.  Problem # 4:  DEGENERATIVE DISC DISEASE (ICD-722.6) Is encouraged to continue to exercise vigorously and follow up with orthopedics  Complete Medication List: 1)  Atinorol  .... 1/2 tab once daily 2)  Fosamax 35 Mg Tabs (Alendronate sodium) .Marland KitchenMarland KitchenMarland Kitchen 1 weekly 3)  Hydrochlorothiazide 25 Mg Tabs (Hydrochlorothiazide) .... 1/2 tab daily 4)  Protonix 40 Mg Tbec (Pantoprazole sodium) .... Once daily 5)  B-50 Tabs (Vitamins-lipotropics) .Marland Kitchen.. 1 daily 6)  Vitamin C 500  .... Once daily 7)  Vitamin A 15,000  .... Once daily 8)  Vitamin D 400 Iu  .... Once daily 9)  Vitamin E 400 Iu  .... Once daily 10)  Glucosomine  .... Once daily 11)  Condrointin  .... Once daily 12)  Fish Oil Maximum Strength 1200 Mg Caps (Omega-3 fatty acids) .... Once daily 13)  Chlorthalidone 25 Mg Tabs (Chlorthalidone) .... 1/2 tab by mouth daily  Patient Instructions: 1)  Please schedule a follow-up appointment in 5months  or sooner if leg cramps, palp any concerns 2)  Try Hyland's Night time leg cramp meds as needed 3)  Take Folic Acid 1 mg daily (daughter) 4)  Needs annual labs at visit, v70.0 5)  BMP prior to visit, ICD-9:  6)  Hepatic Panel prior to visit ICD-9:  7)  Lipid panel prior to visit ICD-9 :  8)  TSH prior to visit ICD-9 :  9)  CBC w/ Diff prior to visit ICD-9 :  Prescriptions: CHLORTHALIDONE 25 MG TABS (CHLORTHALIDONE) 1/2 tab by mouth daily  #15 x 5   Entered and Authorized by:   Danise Edge MD   Signed by:   Danise Edge MD on 03/03/2010   Method used:   Electronically to        [facility]  [address] [phone]* (retail)       [address]  [place], [place]  [address]       Ph: [phone] or [phone]       Fax: [phone]   RxID:   [id number]    Orders Added: 1)  New Patient Level III [36644]   Immunization History:  Influenza Immunization History:    Influenza:  historical (10/07/2009)   Immunization History:  Influenza Immunization History:    Influenza:  Historical (10/07/2009)  Preventive Care Screening  Bone Density:    Date:  07/07/2009    Results:  historical std dev  Pap Smear:    Date:  07/07/2009    Results:  historical   Mammogram:  Date:  03/09/2009    Results:  historical   Colonoscopy:    Date:  02/07/2007    Results:  historical

## 2010-03-10 NOTE — Assessment & Plan Note (Signed)
Summary: Pt wants to meet with phy to choose PCP/dt   Vitals Entered By: Josph Macho RMA (February 08, 2010 8:30 AM) CC: Pt wants to interview doctor/ CF Comments Patient had 10 min. interview with doctor will establish with the physician no charge for short session.

## 2010-03-10 NOTE — Progress Notes (Signed)
Summary: Hydorchlorothiacide refill  Phone Note Call from Patient Call back at Home Phone [phone]   Caller: Patient Summary of Call: Pt needs refill for Hydorchlorothiacide, she takes 1/2 of a 25 mg tablet once a day, [facility] Initial call taken by: Lannette Donath,  February 28, 2010 2:08 PM  Follow-up for Phone Call        Pt is not established with Dr Abner Greenspan yet. Pt only came in on Jan. 3 to meet the MD. We can't give refills/ send in medication when we are not the provider.  Erie Noe states she will call pt and let her know. Agree, SAB Follow-up by: Josph Macho RMA,  February 28, 2010 2:17 PM  Additional Follow-up for Phone Call Additional follow up Details #1::        Called patient to inform her she had to establish as a patient with Dr. Abner Greenspan in order to have medications refilled. Additional Follow-up by: Georga Bora,  March 01, 2010 10:19 AM

## 2010-03-14 ENCOUNTER — Encounter (HOSPITAL_COMMUNITY): Payer: Self-pay

## 2010-03-14 ENCOUNTER — Ambulatory Visit
Admission: RE | Admit: 2010-03-14 | Discharge: 2010-03-14 | Disposition: A | Discharge status: Home / Self Care | Payer: BC Managed Care – PPO | Source: Ambulatory Visit | Attending: Oncology | Admitting: Oncology

## 2010-03-14 ENCOUNTER — Ambulatory Visit (HOSPITAL_COMMUNITY)
Admission: RE | Admit: 2010-03-14 | Discharge: 2010-03-14 | Disposition: A | Discharge status: Home / Self Care | Payer: BC Managed Care – PPO | Source: Ambulatory Visit | Attending: Oncology | Admitting: Oncology

## 2010-03-14 DIAGNOSIS — C50919 Malignant neoplasm of unspecified site of unspecified female breast: Secondary | ICD-10-CM

## 2010-03-14 DIAGNOSIS — Z853 Personal history of malignant neoplasm of breast: Secondary | ICD-10-CM | POA: Insufficient documentation

## 2010-03-14 DIAGNOSIS — Z1231 Encounter for screening mammogram for malignant neoplasm of breast: Secondary | ICD-10-CM | POA: Insufficient documentation

## 2010-03-14 DIAGNOSIS — N63 Unspecified lump in unspecified breast: Secondary | ICD-10-CM

## 2010-03-14 DIAGNOSIS — Z09 Encounter for follow-up examination after completed treatment for conditions other than malignant neoplasm: Secondary | ICD-10-CM | POA: Insufficient documentation

## 2010-03-14 LAB — CREATININE, SERUM: GFR calc Af Amer: >60 mL/min (ref >60)

## 2010-03-14 MED ORDER — GADOBENATE DIMEGLUMINE 529 MG/ML IV SOLN
15.0000 mL | Freq: Once | INTRAVENOUS | Status: AC | PRN
  Administered 2010-03-14: 15 mL via INTRAVENOUS

## 2010-03-24 NOTE — Letter (Signed)
Summary: Wellness Program Release Form/[facility]  Wellness Program Release Form/[facility]   Imported By: Lanelle Bal 03/16/2010 13:06:28  _____________________________________________________________________  External Attachment:    Type:   Image     Comment:   External Document

## 2010-04-04 ENCOUNTER — Telehealth: Payer: Self-pay | Admitting: Family Medicine

## 2010-04-14 NOTE — Progress Notes (Signed)
Summary: Refill  Phone Note Refill Request Call back at [phone]   Refills Requested: Medication #1:  ATINOROL 1/2 tab once daily   Last Refilled: 02/28/2010 patient is needing a refill.  Initial call taken by: Georga Bora,  April 04, 2010 9:07 AM  Follow-up for Phone Call        OK to refill med, but I think this may be misspelled, perhaps she meant Atenolol? Please have her read off the spelling and the dosage of the medication so we can clarify before filling Follow-up by: Danise Edge MD,  April 04, 2010 9:33 AM  Additional Follow-up for Phone Call Additional follow up Details #1::        Left a message on pts cell to return my call w/the correct spelling of the medication Additional Follow-up by: Josph Macho RMA,  April 04, 2010 10:34 AM    Additional Follow-up for Phone Call Additional follow up Details #2::    Patient called back and med is Atenolol 25 mg 1/2 tab daily Follow-up by: Josph Macho RMA,  April 04, 2010 11:19 AM  New/Updated Medications: ATENOLOL 25 MG TABS (ATENOLOL) 1/2 tab daily Prescriptions: ATENOLOL 25 MG TABS (ATENOLOL) 1/2 tab daily  #15 x 2   Entered by:   Josph Macho RMA   Authorized by:   Danise Edge MD   Signed by:   Josph Macho RMA on 04/04/2010   Method used:   [facility]  [address] [phone][facility] ([facility]ail[facility]       [address] [place], [place]  [address]       Ph: [phone] or [phone]       Fax: [phone]   RxID:   [id number]

## 2010-05-24 LAB — CREATININE, SERUM: GFR calc Af Amer: >60 mL/min (ref >60)

## 2010-06-21 NOTE — Op Note (Signed)
Carrie [name], Carrie [name]             ACCOUNT NO.:  [id number]   MEDICAL RECORD NO.:  [id number]          PATIENT TYPE:  AMB   LOCATION:  DSC                          FACILITY:  [facility]   PHYSICIAN:  Cindee Salt, M.D.       DATE OF BIRTH:  [birth year]   DATE OF PROCEDURE:  09/26/2007  DATE OF DISCHARGE:                               OPERATIVE REPORT   PREOPERATIVE DIAGNOSIS:  Mass, right index finger.   POSTOPERATIVE DIAGNOSIS:  Mass, right index finger.   OPERATION:  Excision of mass, right index finger, proximal phalanx volar  surface.   SURGEON:  Cindee Salt, MD   ASSISTANT:  Joaquin Courts, RN   ANESTHESIA:  General.   ANESTHESIOLOGIST:  Janetta Hora. Gelene Mink, MD   ANESTHESIA:  General.   HISTORY:  The patient is a 65 year old female with a history of a mass  on the palmar aspect of the proximal phalanx of right index finger.  This involves approximately a one-half area of the entire proximal  phalanx.  She is desirous having this removed.  She is aware of risks  and complications including infection, recurrence, injury to arteries,  nerves, or tendons, incomplete relief of symptoms, and dystrophy.  In  preoperative area, the patient is seen.  The extremity marked by both  the patient and surgeon.  Antibiotic given.   PROCEDURE:  The patient was brought to the operating room where general  anesthetic was carried out without difficulty under the supervision of  Dr. Gelene Mink.  She was prepped using a DuraPrep, supine position, right  arm free.  A time-out was taken.  The limb was exsanguinated with an  Esmarch bandage.  Tourniquet was placed in the forearm was inflated to  250 mmHg.  Oblique incision was made over the proximal phalanx, carried  down through the subcutaneous tissue.  Neurovascular structures were  identified and protected.  A large cystic mass was immediately apparent.  This was dissected free with blunt and sharp dissection taking care to  protect the neurovascular  bundles.  The specimen was sent to pathology  in toto.  No further lesions were identified.  The wound was irrigated  with saline and closed with interrupted 5-0 Vicryl Rapide sutures.  Sterile compressive dressing was applied.  The patient tolerated the  procedure well.  She will be discharged to home to return to [facility]  [facility] [place] in 1 week on [place] NX.           ______________________________  Cindee Salt, M.D.    GK/MEDQ  D:  09/26/2007  T:  09/26/2007  Job:  [id number]

## 2010-06-24 NOTE — Op Note (Signed)
NAMEEMELIE, NEWSOM             ACCOUNT NO.:  [id number]   MEDICAL RECORD NO.:  [id number]          PATIENT TYPE:  OUT   LOCATION:  DFTL                         FACILITY:  [facility]   PHYSICIAN:  Leonie Man, M.D.   DATE OF BIRTH:  08/11/1945   DATE OF PROCEDURE:  08/23/2004  DATE OF DISCHARGE:  08/23/2004                                 OPERATIVE REPORT   PREOPERATIVE DIAGNOSIS:  Carcinoma of the left breast.   POSTOPERATIVE DIAGNOSIS:  Carcinoma of the left breast.   PROCEDURE:  Left lumpectomy and sentinel lymph node biopsy.   SURGEON:  Ballen.   ASSISTANT:  Nurse.   ANESTHESIA:  General.   NOTE:  The patient is a 65 year old female with a diagnosed invasive  carcinoma of the left breast. She comes to the operating room now for  lumpectomy and sentinel lymph node biopsy.   PROCEDURE:  Following induction of satisfactory general anesthesia, the left  breast was prepped and draped to be included in a sterile operative field.  The periareolar region of the left breast was then injected with 5 mL of  methylene blue dye and massaged into the lymphatics. Following massage, the  patient was approached through a lateral incision following the guidewire  down to the region of the mass. Superior and inferior flaps are raised and  the tissue around the guidewire is dissected free and removed in its  entirety. This was forwarded for pathologic evaluation and Touch Preps on  the margins showed no evidence of carcinoma. Hemostasis was assured within  the wound.  The subcutaneous tissues closed with interrupted 3-0 Vicryl  sutures and skin was closed with running 4-0 Monocryl suture. Attention then  turned to the left axilla where transaxillary incision is made and using the  NeoProbe, dissection was carried down to the region of highest radioactive  counts.  Dissecting in this area, two sentinel lymph nodes were identified  and these were hot and blue nodes. Both were dissected free,  removed and  forwarded for pathologic evaluation. Both nodes inspected on Touch Prep and  noted to be without evidence of carcinoma. Hemostasis was then secured with  electrocautery. Sponge and instrument counts verified. The subcutaneous  tissues closed with interrupted 3-0 Vicryl sutures and the skin closed with  running 4-0 Monocryl suture. Sterile dressings were applied. Anesthetic  reversed. The patient removed from the operating room to the recovery room  in stable condition. She tolerated the procedure well.      Leonie Man, M.D.  Electronically Signed     PB/MEDQ  D:  09/20/2004  T:  09/20/2004  Job:  [id number]

## 2010-06-24 NOTE — Op Note (Signed)
Carrie, [name]             ACCOUNT NO.:  [id number]   MEDICAL RECORD NO.:  [id number]          PATIENT TYPE:  AMB   LOCATION:  DSC                          FACILITY:  [facility]   PHYSICIAN:  Leonie Man, M.D.   DATE OF BIRTH:  [birth year]   DATE OF PROCEDURE:  08/23/2004  DATE OF DISCHARGE:  08/23/2004                                 OPERATIVE REPORT   PREOPERATIVE DIAGNOSIS:  Carcinoma of the left breast.   POSTOPERATIVE DIAGNOSIS:  Carcinoma of the left breast.   PROCEDURE:  Left partial mastectomy with sentinel lymph node biopsy.   SURGEON:  Leonie Man, M.D.   ASSISTANT:  OR nurse.   ANESTHESIA:  General.   SPECIMENS TO LABORATORY:  Breast tissue and sentinel nodes.   ESTIMATED BLOOD LOSS:  Minimal.   COMPLICATIONS:  None.   CONDITION:  The patient returned to the PACU in excellent condition.   INDICATION:  This patient is a 65 year old woman who underwent routine  mammogram this year which was read as a BI-RADS II with a primary stable  calcification of the breast.  She was subsequently seen by Dr. Teodora Medici  and he discovered a mass in the upper-outer quadrant of the left breast on  clinical examination.  The upper-outer quadrant lesion was located  approximately 6 cm from the nipple and on biopsy of this lesion is an  adenocarcinoma.  The patient comes to the operating room now for partial  mastectomy of the left breast with associated sentinel lymph node biopsy.   She understands the risks and potential benefits of surgery and gives her  consent.   PROCEDURE:  Following the induction of satisfactory anesthesia with the  patient positioned supinely, the left breast was prepped and draped to be  included in a sterile operative field.  An elliptical incision was made over  the region of the mass and deepened through skin and subcutaneous tissue,  taking a wide wedge of breast tissue surrounding the mass in its entirety.  This was forwarded for  pathologic evaluation and surgical margins on touch  prep were free of evidence of metastatic carcinoma.  Attention was then  turned to the left axilla and with the use of the NeoProbe to localize the  sentinel node which had been previously injected by periareolar injection of  radionucleotide and  periareolar injection of methylene blue solution, a  transaxillary incision was made and deepened through the skin and  subcutaneous tissue, and using the NeoProbe as a guide, I was guided down to  the sentinel node.  Two sentinel nodes were excised which were hot and blue  and these were forwarded for pathologic evaluation, both of which came back  negative for carcinoma.  Hemostasis was obtained with electrocautery, both  in the axilla and in the breast.  Sponge and instrument counts were verified  and the subcutaneous tissues of the breast  and axilla closed with interrupted 3-0 Vicryl sutures, skin closed with 5-0  Monocryl sutures and reinforced with Steri-Strips, sterile dressings  applied, anesthetic reversed and the patient removed from the operating room  to the recovery  room in stable condition.  She tolerated the procedure well.      Leonie Man, M.D.  Electronically Signed     PB/MEDQ  D:  11/28/2004  T:  11/29/2004  Job:  [id number]

## 2010-06-24 NOTE — Op Note (Signed)
Carrie [name], Carrie [name]             ACCOUNT NO.:  [id number]   MEDICAL RECORD NO.:  [id number]          PATIENT TYPE:  AMB   LOCATION:  DAY                          FACILITY:  [facility]   PHYSICIAN:  Currie Paris, M.D.DATE OF BIRTH:  11-Dec-1945   DATE OF PROCEDURE:  10/03/2004  DATE OF DISCHARGE:                                 OPERATIVE REPORT   CCS#:  [id number].   PREOPERATIVE DIAGNOSIS:  Breast cancer upper outer quadrant left breast.   POSTOPERATIVE DIAGNOSIS:  Breast cancer upper outer quadrant left breast.   OPERATION:  MammoSite placement.   SURGEON:  Dr. Jamey Ripa.   ASSISTANT:  Dr. Lurene Shadow.   ANESTHESIA:  MAC.   CLINICAL HISTORY:  This is a 66 year old whose been thought to be good  candidate for MammoSite radiation therapy for her recently operated breast  cancer.   DESCRIPTION OF PROCEDURE:  The patient was seen in the holding area and I  reviewed with her the indications, risks, and complications including the  fact that we might not be able to successfully place one or once placed it  might not be able to be used depending on a number of factors which we  reviewed. She wished to proceed.   The patient was then taken to the operating room and given IV sedation. The  area of the lumpectomy cavity had already been marked in the holding area. I  ultrasounded the area and saw what appeared to be an adequate seroma cavity.  The breast was then prepped and draped and the time-out occurred.   I infiltrated 0.25% Marcaine with epi at the lateral edge of the incision  and also into the seroma cavity. I made a small incision at the lateral  corner of the scar and using scar entry technique and a hemostat was able to  enter the seroma cavity and drained the seroma out.   I thought we had a relatively small cavity so we used the smaller balloon.  It was initially filled and checked for symmetry and appeared to be  perfectly symmetric having been reviewed by all of was that  were scrubbed.   The catheter was then inserted into the seroma cavity and went easily. It  was slowly inflated to 35 mL. I stopped there because we had visible  distension of the breast with the seroma with the balloon in. I used the  ultrasound and we appeared to have no residual seroma cavity therefore good  conformance. The closest skin to the balloon margin that I could identify  was 12 mm.   I put one single suture just medial to the catheter to be sure that there  was no tension on the remainder of the scar. The trocar was removed and the  flexible stylette placed and the sterile dressing applied. The patient  tolerated the procedure well with no complications.      Currie Paris, M.D.  Electronically Signed     CJS/MEDQ  D:  10/03/2004  T:  10/04/2004  Job:  [id number]

## 2010-06-28 ENCOUNTER — Other Ambulatory Visit: Payer: Self-pay | Admitting: Family Medicine

## 2010-07-21 ENCOUNTER — Other Ambulatory Visit: Payer: Self-pay | Admitting: Oncology

## 2010-07-21 ENCOUNTER — Encounter: Payer: BC Managed Care – PPO | Admitting: Oncology

## 2010-07-21 DIAGNOSIS — C50419 Malignant neoplasm of upper-outer quadrant of unspecified female breast: Secondary | ICD-10-CM

## 2010-07-21 DIAGNOSIS — M949 Disorder of cartilage, unspecified: Secondary | ICD-10-CM

## 2010-07-21 DIAGNOSIS — M899 Disorder of bone, unspecified: Secondary | ICD-10-CM

## 2010-07-21 DIAGNOSIS — Z170 Estrogen receptor positive status [ER+]: Secondary | ICD-10-CM

## 2010-07-21 LAB — CBC WITH DIFFERENTIAL/PLATELET
BASO%: 0.3 % (ref 0.0–2.0)
HCT: 39 % (ref 34.8–46.6)
LYMPH%: 24.3 % (ref 14.0–49.7)
MCHC: 35.2 g/dL (ref 31.5–36.0)
MONO#: 0.3 10*3/uL (ref 0.1–0.9)
NEUT%: 69 % (ref 38.4–76.8)
Platelets: 179 10*3/uL (ref 145–400)
WBC: 6.1 10*3/uL (ref 3.9–10.3)

## 2010-07-21 LAB — COMPREHENSIVE METABOLIC PANEL
AST: 18 U/L (ref 0–37)
BUN: 16 mg/dL (ref 6–23)
Calcium: 9.5 mg/dL (ref 8.4–10.5)
Chloride: 102 mEq/L (ref 96–112)
Creatinine, Ser: 0.66 mg/dL (ref 0.50–1.10)

## 2010-07-21 LAB — LACTATE DEHYDROGENASE: LDH: 131 U/L (ref 94–250)

## 2010-07-26 ENCOUNTER — Other Ambulatory Visit: Payer: Self-pay

## 2010-07-26 MED ORDER — ZOLPIDEM TARTRATE 5 MG PO TABS: 5.0000 mg | ORAL_TABLET | Freq: Every evening | ORAL | Status: DC | PRN

## 2010-07-26 NOTE — Telephone Encounter (Signed)
OK to refill with #30 with 3 rf

## 2010-07-28 ENCOUNTER — Encounter (HOSPITAL_BASED_OUTPATIENT_CLINIC_OR_DEPARTMENT_OTHER): Payer: BC Managed Care – PPO | Admitting: Oncology

## 2010-07-28 ENCOUNTER — Encounter: Payer: Self-pay | Admitting: Family Medicine

## 2010-07-28 ENCOUNTER — Ambulatory Visit (INDEPENDENT_AMBULATORY_CARE_PROVIDER_SITE_OTHER): Payer: BC Managed Care – PPO | Admitting: Family Medicine

## 2010-07-28 DIAGNOSIS — M899 Disorder of bone, unspecified: Secondary | ICD-10-CM

## 2010-07-28 DIAGNOSIS — C439 Malignant melanoma of skin, unspecified: Secondary | ICD-10-CM

## 2010-07-28 DIAGNOSIS — Z170 Estrogen receptor positive status [ER+]: Secondary | ICD-10-CM

## 2010-07-28 DIAGNOSIS — IMO0001 Reserved for inherently not codable concepts without codable children: Secondary | ICD-10-CM

## 2010-07-28 DIAGNOSIS — E876 Hypokalemia: Secondary | ICD-10-CM

## 2010-07-28 DIAGNOSIS — I10 Essential (primary) hypertension: Secondary | ICD-10-CM

## 2010-07-28 DIAGNOSIS — Z853 Personal history of malignant neoplasm of breast: Secondary | ICD-10-CM

## 2010-07-28 DIAGNOSIS — C50419 Malignant neoplasm of upper-outer quadrant of unspecified female breast: Secondary | ICD-10-CM

## 2010-07-28 DIAGNOSIS — Z0000 Encounter for general adult medical examination without abnormal findings: Secondary | ICD-10-CM

## 2010-07-28 DIAGNOSIS — K219 Gastro-esophageal reflux disease without esophagitis: Secondary | ICD-10-CM

## 2010-07-28 DIAGNOSIS — Z8679 Personal history of other diseases of the circulatory system: Secondary | ICD-10-CM

## 2010-07-28 LAB — CBC WITH DIFFERENTIAL/PLATELET
Basophils Absolute: 0 10*3/uL (ref 0.0–0.1)
Eosinophils Absolute: 0.2 10*3/uL (ref 0.0–0.7)
HCT: 40.2 % (ref 36.0–46.0)
Lymphs Abs: 1.3 10*3/uL (ref 0.7–4.0)
MCV: 100.4 fl — ABNORMAL HIGH (ref 78.0–100.0)
Monocytes Absolute: 0.3 10*3/uL (ref 0.1–1.0)
Monocytes Relative: 5.4 % (ref 3.0–12.0)
Platelets: 156 10*3/uL (ref 150.0–400.0)
RDW: 12.6 % (ref 11.5–14.6)

## 2010-07-28 LAB — LIPID PANEL
Cholesterol: 207 mg/dL — ABNORMAL HIGH (ref 0–200)
Total CHOL/HDL Ratio: 2
Triglycerides: 47 mg/dL (ref 0.0–149.0)
VLDL: 9.4 mg/dL (ref 0.0–40.0)

## 2010-07-28 LAB — RENAL FUNCTION PANEL
Creatinine, Ser: 0.5 mg/dL (ref 0.4–1.2)
GFR: 126.63 mL/min (ref >60.00)
Glucose, Bld: 87 mg/dL (ref 70–99)
Phosphorus: 3.9 mg/dL (ref 2.3–4.6)
Sodium: 141 mEq/L (ref 135–145)

## 2010-07-28 LAB — HEPATIC FUNCTION PANEL: Total Bilirubin: 0.6 mg/dL (ref 0.3–1.2)

## 2010-07-28 LAB — LDL CHOLESTEROL, DIRECT: Direct LDL: 94.8 mg/dL

## 2010-07-28 MED ORDER — NAPROXEN 500 MG PO TABS: 500.0000 mg | ORAL_TABLET | Freq: Two times a day (BID) | ORAL | Status: DC | PRN

## 2010-07-28 MED ORDER — RANITIDINE HCL 300 MG PO TABS: ORAL_TABLET | ORAL | Status: DC

## 2010-07-28 NOTE — Assessment & Plan Note (Signed)
Ha an appt with her Oncologist Dr Donnie Coffin later today, is doing well in remission at this time, continue surveillance with Oncology

## 2010-07-28 NOTE — Progress Notes (Signed)
ICELA GLYMPH [id number] 15-Jul-1945 07/28/2010      Progress Note New Patient  Subjective  Chief Complaint  No chief complaint on file.   HPI  Patient is a 65 year old Caucasian female who is in for annual exam. She reports overall she is doing well. She had a left shoulder arthroscopic that 2 weeks ago and had the joint cleaned out. She's had this done of the right shoulder in the past with good results and does feel she is healing well. She does continue to struggle with some pain so she is taking some naproxen intermittently given to her by her orthopedist and does believe that helps her pain to some extent. She is maintaining some exercise and movement restrictions but believe she'll be released soon as she sees her orthopedist later today She has been maintained on Fosamax  For years due discuss more fully with her oncologist before making any decisions. She's been taking calcium and vitamin D daily. She's not having any heartburn or discomfort with the Fosamax but previously had been told after some dysphasia and endoscopy that she did have signs of reflux. When she started Protonix dysphaghia symptoms went away and have never recurred she has been quite some time and is willing to try other medications to try and control her symptomsto her history of osteopenia breast cancer in use of her right index. She is somewhat coming off medication but has agreed to discuss this with her Oncologist later today. She continues to take vitamin D and calcium supplements. She continues to stay active. She reports that she checks her blood pressure is well-controlled a significant headache, chest pain, palpitations, shortness of breath, GI or GU complaints since her last visit. She's had no febrile illness trips to the emergency room or other concerns.   Past Medical History  Diagnosis Date  . Rib fracture     left, 3 fractures s/p radiation: right side 2 fractured, no surgery  . PALPITATIONS, HX OF  03/03/2010  . OSTEOPENIA 03/03/2010  . NEOPLASM, MALIGNANT, BREAST, HX OF 03/03/2010  . 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 Melanoma of skin, site unspecified 03/03/2010  . MEASLES, HX OF 03/03/2010  . HYPOKALEMIA 03/03/2010  . Essential hypertension, benign 03/03/2010  . DEGENERATIVE DISC DISEASE 03/03/2010  . COMMON MIGRAINE 03/03/2010  . CHICKENPOX, HX OF 03/03/2010    Past Surgical History  Procedure Date  . Tonsillectomy   . Clavicle dislocate 65 yrs old    right, reapproximated w/pins, subsequently removed  . Left hip traumatic history     hematoma surgically evacuated    Family History  Problem Relation Age of Onset  . Hypertension Mother   . COPD Mother   . Alcohol abuse Father     history of  . Coronary artery disease Father   . Osteoarthritis Brother     knees  . Basal cell carcinoma Sister   . Heart disease Maternal Grandmother   . Stroke Maternal Grandfather   . Osteoarthritis Sister     knees  . Migraines Sister     History   Social History  . Marital Status: Married    Spouse Name: N/A    Number of Children: N/A  . Years of Education: N/A   Occupational History  . Not on file.   Social History Main Topics  . Smoking status: Former Smoker -- 0.2 packs/day    Quit date: 02/07/1968  . Smokeless tobacco: Not on file  . Alcohol Use: 0.0 oz/week  0 drink(s) per week  . Drug Use:   . Sexually Active:    Other Topics Concern  . Not on file   Social History Narrative  . No narrative on file    Current Outpatient Prescriptions on File Prior to Visit  Medication Sig Dispense Refill  . alendronate (FOSAMAX) 35 MG tablet Take 35 mg by mouth every 7 (seven) days. Take in the morning with a full glass of water, on an empty stomach, and do not take anything else by mouth or lie down for the next 30 min.       . Ascorbic Acid (VITAMIN C) 500 MG tablet Take 500 mg by mouth daily.        Marland Kitchen atenolol (TENORMIN) 25 MG tablet TAKE 1/2 TAB DAILY  15 tablet  2  .  chlorthalidone (HYGROTON) 25 MG tablet Take 25 mg by mouth daily. 1/2 tab       . glucosamine-chondroitin 500-400 MG tablet Take 1 tablet by mouth daily.        . Omega-3 Fatty Acids (FISH OIL) 1200 MG CAPS Take by mouth daily.        . pantoprazole (PROTONIX) 40 MG tablet Take 40 mg by mouth daily.        . Vitamins-Lipotropics (B-50) TABS Take by mouth daily.        Marland Kitchen zolpidem (AMBIEN) 5 MG tablet Take 1 tablet (5 mg total) by mouth at bedtime as needed.  30 tablet  3  . DISCONTD: ATENOLOL PO Take by mouth daily. 1/2 tab       . DISCONTD: hydrochlorothiazide 25 MG tablet Take 25 mg by mouth daily. 1/2 tab       . DISCONTD: Vitamin A 16109 UNITS TABS Take by mouth daily.        Marland Kitchen DISCONTD: vitamin E 400 UNIT capsule Take 400 Units by mouth daily.          Allergies  Allergen Reactions  . Codeine     REACTION: upsets stomach  . Hydrocodone-Acetaminophen     REACTION: slows respiratory rate down  . Meperidine Hcl     REACTION: Hives    Review of Systems  Review of Systems  Constitutional: Negative for fever, chills and malaise/fatigue.  HENT: Negative for hearing loss, nosebleeds and congestion.   Eyes: Negative for discharge.  Respiratory: Negative for cough, sputum production, shortness of breath and wheezing.   Cardiovascular: Negative for chest pain, palpitations and leg swelling.  Gastrointestinal: Negative for heartburn, nausea, vomiting, abdominal pain, diarrhea, constipation and blood in stool.  Genitourinary: Negative for dysuria, urgency, frequency and hematuria.  Musculoskeletal: Positive for joint pain. Negative for myalgias, back pain and falls.       Patient had arthrocopy and cleaning of left shoulder 2 weeks ago. Is healing well but still struggling with some discomfort. Is maintaining some exercise and movement restrictions while she is healing and taking some Naproxen as needed, which is somewhat helpful  Skin: Negative for rash.  Neurological: Negative for  dizziness, tremors, sensory change, focal weakness, loss of consciousness, weakness and headaches.  Endo/Heme/Allergies: Negative for polydipsia. Does not bruise/bleed easily.  Psychiatric/Behavioral: Negative for depression and suicidal ideas. The patient is not nervous/anxious and does not have insomnia.     Objective  BP 119/80  Pulse 55  Temp 98.3 F (36.8 C)  Resp 16  Ht 5\' 5"  (1.651 m)  Wt 122 lb 12.8 oz (55.702 kg)  BMI 20.44 kg/m2  Physical Exam  Physical Exam  Constitutional: She is oriented to person, place, and time and well-developed, well-nourished, and in no distress. No distress.  HENT:  Head: Normocephalic and atraumatic.  Right Ear: External ear normal.  Left Ear: External ear normal.  Nose: Nose normal.  Mouth/Throat: Oropharynx is clear and moist. No oropharyngeal exudate.  Eyes: Conjunctivae are normal. Pupils are equal, round, and reactive to light. Right eye exhibits no discharge. Left eye exhibits no discharge. No scleral icterus.  Neck: Normal range of motion. Neck supple. No thyromegaly present.  Cardiovascular: Normal rate, regular rhythm, normal heart sounds and intact distal pulses.   No murmur heard. Pulmonary/Chest: Effort normal and breath sounds normal. No respiratory distress. She has no wheezes. She has no rales.  Abdominal: Soft. Bowel sounds are normal. She exhibits no distension and no mass. There is no tenderness.  Musculoskeletal: Normal range of motion. She exhibits no edema and no tenderness.  Lymphadenopathy:    She has no cervical adenopathy.  Neurological: She is alert and oriented to person, place, and time. She has normal reflexes. No cranial nerve deficit. Coordination normal.  Skin: Skin is warm and dry. No rash noted. She is not diaphoretic.  Psychiatric: Mood, memory and affect normal.       Assessment & Plan  PALPITATIONS, HX OF No recent episodes  OSTEOPENIA Has been maintained Alendronate due to her h/o breast  cancer and associated treatments. She is interested in coming off the medications and is going to discuss the cost benefit of this move with her oncologist who has been maintaining the medication. She is encouraged to maintain her good exercise regimen once her shoulder finishes healing and to continue her calcium and Vitamin D intake.   NEOPLASM, MALIGNANT, BREAST, HX OF Ha an appt with her Oncologist Dr Donnie Coffin later today, is doing well in remission at this time, continue surveillance with Oncology  MELANOMA OF SKIN, SITE UNSPECIFIED Follows with dermatology for regular skin mapping, no recent concerning lesions. Maintain good protection from the sun  HYPOKALEMIA Asymptomatic, will check a level today  ESSENTIAL HYPERTENSION, BENIGN Adequately controlled today, no change in medications would continue Atenolol at 1/2 tab and Chlorthalidone at 1/2 tab daily.  Preventative health care Overall patient doing very well. No changes to her regimen today. Encouraged ongoing exercise regimen and healthy diet. Will continue to receive her GYN care Dr Mamie Laurel. Will run annual labs now and repeat with annual exam in one years time or will see her as needed

## 2010-07-28 NOTE — Assessment & Plan Note (Signed)
No recent episodes

## 2010-07-28 NOTE — Assessment & Plan Note (Signed)
Overall patient doing very well. No changes to her regimen today. Encouraged ongoing exercise regimen and healthy diet. Will continue to receive her GYN care Dr Mamie Laurel. Will run annual labs now and repeat with annual exam in one years time or will see her as needed

## 2010-07-28 NOTE — Assessment & Plan Note (Signed)
Asymptomatic, will check a level today

## 2010-07-28 NOTE — Assessment & Plan Note (Signed)
Adequately controlled today, no change in medications would continue Atenolol at 1/2 tab and Chlorthalidone at 1/2 tab daily.

## 2010-07-28 NOTE — Assessment & Plan Note (Signed)
Follows with dermatology for regular skin mapping, no recent concerning lesions. Maintain good protection from the sun

## 2010-07-28 NOTE — Patient Instructions (Signed)

## 2010-07-28 NOTE — Assessment & Plan Note (Signed)
Has been maintained Alendronate due to her h/o breast cancer and associated treatments. She is interested in coming off the medications and is going to discuss the cost benefit of this move with her oncologist who has been maintaining the medication. She is encouraged to maintain her good exercise regimen once her shoulder finishes healing and to continue her calcium and Vitamin D intake.

## 2010-07-31 LAB — NMR LIPOPROFILE WITHOUT LIPIDS: HDL Particle Number: 34.3 umol/L (ref >=30.5)

## 2010-08-01 ENCOUNTER — Telehealth: Payer: Self-pay

## 2010-08-01 NOTE — Telephone Encounter (Signed)
Patient called wanting her cholesterol numbers and lab results mailed to her. Lab results were printed and mailed

## 2010-08-22 ENCOUNTER — Other Ambulatory Visit: Payer: Self-pay | Admitting: Family Medicine

## 2010-09-17 ENCOUNTER — Other Ambulatory Visit: Payer: Self-pay | Admitting: Family Medicine

## 2010-09-20 NOTE — Telephone Encounter (Signed)
Please advise 

## 2010-11-22 ENCOUNTER — Other Ambulatory Visit: Payer: Self-pay

## 2010-11-22 MED ORDER — ZOLPIDEM TARTRATE 5 MG PO TABS: 5.0000 mg | ORAL_TABLET | Freq: Every evening | ORAL | Status: DC | PRN

## 2010-12-22 ENCOUNTER — Other Ambulatory Visit: Payer: Self-pay | Admitting: Family Medicine

## 2011-01-06 ENCOUNTER — Telehealth: Payer: Self-pay | Admitting: Oncology

## 2011-01-06 NOTE — Telephone Encounter (Signed)
per pof 06/22 called pts home lmovm for appts in june2013 and to rtn call to confirm appts

## 2011-01-26 ENCOUNTER — Other Ambulatory Visit: Payer: Self-pay | Admitting: Dermatology

## 2011-02-08 ENCOUNTER — Other Ambulatory Visit: Payer: Self-pay | Admitting: Oncology

## 2011-02-08 DIAGNOSIS — Z853 Personal history of malignant neoplasm of breast: Secondary | ICD-10-CM

## 2011-02-24 ENCOUNTER — Other Ambulatory Visit: Payer: Self-pay | Admitting: Family Medicine

## 2011-03-03 ENCOUNTER — Telehealth: Payer: Self-pay | Admitting: Cardiology

## 2011-03-03 NOTE — Telephone Encounter (Signed)
left message

## 2011-03-03 NOTE — Telephone Encounter (Signed)
FU call: Pt returning call from our office regarding pt setting up appt to see Dr. Patty Sermons. Please return pt call to discuss further.

## 2011-03-06 NOTE — Telephone Encounter (Signed)
F/U  Patient returning call from Nurse  MP,  She can be reached at hm# today [phone]

## 2011-03-06 NOTE — Telephone Encounter (Signed)
Scheduled appointment for 1/30

## 2011-03-08 ENCOUNTER — Encounter: Payer: Self-pay | Admitting: Cardiology

## 2011-03-08 ENCOUNTER — Ambulatory Visit (INDEPENDENT_AMBULATORY_CARE_PROVIDER_SITE_OTHER): Payer: BC Managed Care – PPO | Admitting: Cardiology

## 2011-03-08 VITALS — BP 108/70 | Ht 66.0 in | Wt 127.0 lb

## 2011-03-08 DIAGNOSIS — I4949 Other premature depolarization: Secondary | ICD-10-CM

## 2011-03-08 DIAGNOSIS — I341 Nonrheumatic mitral (valve) prolapse: Secondary | ICD-10-CM

## 2011-03-08 DIAGNOSIS — I059 Rheumatic mitral valve disease, unspecified: Secondary | ICD-10-CM

## 2011-03-08 DIAGNOSIS — I493 Ventricular premature depolarization: Secondary | ICD-10-CM

## 2011-03-08 NOTE — Progress Notes (Signed)
Reason for Consult: Palpitations Referring Physician: Dr. Pearla Dubonnet  Carrie [name] is an 65 y.o. female.  HPI: This pleasant 66 year old woman is seen at the request of Dr. Drue Second for evaluation of PVCs.  This patient has been in good general health.  She has had a long history of occasional PVCs.  She has been on long-term low-dose atenolol.  She had one episode recently where her PVCs kept her awake all night.  He had been taking her atenolol in the morning but after that she switched to the evening and since then she's had no further problems.  Her electrocardiogram the next day did document that her arrhythmia was caused by isolated PVCs.  She does not have any history of exertional symptoms.  She has never noted the premature beats with exercise.  She exercises regularly.  She uses a treadmill and also lifts weights.  She works out at home.  She's never had a problem with cholesterol over high blood pressure.  She does not have any prior history of a heart murmur.  She has had her electrolytes and her thyroid functions checked recently by her primary care provider and they were fine. Her past medical history is notable for arthritis of both shoulders.  She is a former Engineer, production and equestrian rider and has had a lot of trauma to her shoulders over the years and has been told that she would eventually need to have a total shoulder joint replacement.  She is followed by Dr. Rennis Chris. She has a history of gastroesophageal reflux disease and is on Zantac.  She has had recent colonoscopy and endoscopy. The patient also has caffeine in all forms.  The patient is married.  She has a 12 year old daughter who is still in college and lives at home.  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  Past Medical History  Diagnosis Date  . Rib fracture     left, 3 fractures s/p radiation: right side 2 fractured, no surgery  . PALPITATIONS, HX OF  03/03/2010  . OSTEOPENIA 03/03/2010  . NEOPLASM, MALIGNANT, BREAST, HX OF 03/03/2010  . 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 Melanoma of skin, site unspecified 03/03/2010  . MEASLES, HX OF 03/03/2010  . HYPOKALEMIA 03/03/2010  . Essential hypertension, benign 03/03/2010  . COMMON MIGRAINE 03/03/2010  . CHICKENPOX, HX OF 03/03/2010  . DEGENERATIVE DISC DISEASE 03/03/2010    Past Surgical History  Procedure Date  . Tonsillectomy   . Clavicle dislocate 66 yrs old    right, reapproximated w/pins, subsequently removed  . Left hip traumatic history     hematoma surgically evacuated  . Shoulder arthroscopy     b/l shoulder with debridement    Family History  Problem Relation Age of Onset  . Hypertension Mother   . COPD Mother   . Alcohol abuse Father     history of  . Coronary artery disease Father   . Osteoarthritis Brother     knees  . Basal cell carcinoma Sister   . Heart disease Maternal Grandmother   . Stroke Maternal Grandfather   . Osteoarthritis Sister     knees  . Migraines Sister     Social History:  reports that she quit smoking about 43 years ago. She does not have any smokeless tobacco history on file. She reports that she drinks alcohol. Her drug history not on file.  Allergies:  Allergies  Allergen Reactions  . Codeine  REACTION: upsets stomach  . Demerol     hives  . Hydrocodone-Acetaminophen     REACTION: slows respiratory rate down  . Meperidine Hcl     REACTION: Hives    Medications: I have reviewed the patient's current medications.  No results found for this or any previous visit (from the past 48 hour(s)).  No results found.  Review of systems are otherwise negative in detail Blood pressure 108/70, height 5\' 6"  (1.676 m), weight 127 lb (57.607 kg). The general appearance reveals a well-developed well-nourished woman in no distress.Pupils equal and reactive.   Extraocular Movements are full.  There is no scleral icterus.  The mouth and pharynx are  normal.  The neck is supple.  The carotids reveal no bruits.  The jugular venous pressure is normal.  The thyroid is not enlarged.  There is no lymphadenopathy.  The chest is clear to percussion and auscultation. There are no rales or rhonchi. Expansion of the chest is symmetrical.  The heart reveals a sharp midsystolic click without murmur.  With Valsalva maneuver the click becomes much softer.  There is no gallop or rub.The abdomen is soft and nontender. Bowel sounds are normal. The liver and spleen are not enlarged. There Are no abdominal masses. There are no bruits.  The pedal pulses are good.  There is no phlebitis or edema.  There is no cyanosis or clubbing. Neurologic exam is normal.The skin is warm and dry.  There is no rash.  EKG today shows normal sinus rhythm and no PVCs.  Assessment/Plan: Her physical examination today suggests that she may have a mild degree of mitral valve prolapse with a midsystolic click although I do not hear any late systolic murmur.  Her occasional PVCs and symptomatic palpitations may also be related to this. At this point she does not need to be on any additional medication.  We will have her return for a two-dimensional echocardiogram to look further at her mitral valve.  She does have a family history of valvular heart disease with her mother having aortic valve disease.  The patient was counseled that if she does have another bad episode of palpitations she does take an extra half tablet of atenolol.  She does not need to limit her physical activity.  Thanks for the opportunity to see this pleasant woman with you.  I've asked her to let me check her again in about one year for followup on her cardiac situation.  Cassell Clement 03/08/2011, 5:01 PM

## 2011-03-08 NOTE — Patient Instructions (Signed)
Your physician recommends that you continue on your current medications as directed. Please refer to the Current Medication list given to you today.  Your physician wants you to follow-up in: 1 year  You will receive a reminder letter in the mail two months in advance. If you don't receive a letter, please call our office to schedule the follow-up appointment.   Your physician has requested that you have an echocardiogram. Echocardiography is a painless test that uses sound waves to create images of your heart. It provides your doctor with information about the size and shape of your heart and how well your heart's chambers and valves are working. This procedure takes approximately one hour. There are no restrictions for this procedure.   

## 2011-03-16 ENCOUNTER — Ambulatory Visit
Admission: RE | Admit: 2011-03-16 | Discharge: 2011-03-16 | Disposition: A | Discharge status: Home / Self Care | Payer: BC Managed Care – PPO | Source: Ambulatory Visit | Attending: Oncology | Admitting: Oncology

## 2011-03-16 DIAGNOSIS — Z853 Personal history of malignant neoplasm of breast: Secondary | ICD-10-CM

## 2011-03-17 ENCOUNTER — Ambulatory Visit (HOSPITAL_COMMUNITY): Payer: BC Managed Care – PPO | Attending: Cardiology | Admitting: Radiology

## 2011-03-17 DIAGNOSIS — I341 Nonrheumatic mitral (valve) prolapse: Secondary | ICD-10-CM

## 2011-03-17 DIAGNOSIS — I10 Essential (primary) hypertension: Secondary | ICD-10-CM | POA: Insufficient documentation

## 2011-03-17 DIAGNOSIS — I493 Ventricular premature depolarization: Secondary | ICD-10-CM

## 2011-03-17 DIAGNOSIS — I4949 Other premature depolarization: Secondary | ICD-10-CM

## 2011-03-21 ENCOUNTER — Telehealth: Payer: Self-pay | Admitting: Cardiology

## 2011-03-21 ENCOUNTER — Telehealth: Payer: Self-pay | Admitting: *Deleted

## 2011-03-21 NOTE — Telephone Encounter (Signed)
Advised and will send to Dr Drue Second

## 2011-03-21 NOTE — Telephone Encounter (Signed)
New Problem:     Patient called in wanting to know what the results of her Last ECHO was. Please call back.

## 2011-03-21 NOTE — Telephone Encounter (Signed)
Advised echo good and sent to Dr Drue Second

## 2011-03-21 NOTE — Telephone Encounter (Signed)
Message copied by Burnell Blanks on Tue Mar 21, 2011  3:10 PM ------      Message from: Cassell Clement      Created: Fri Mar 17, 2011  5:51 PM       Please report.  Echo shows normal systolic function.  Valves are good.       Send copy to Dr. Drue Second.

## 2011-03-21 NOTE — Telephone Encounter (Signed)
Will forward to Dr Brackbill"s nurse. 

## 2011-03-29 ENCOUNTER — Other Ambulatory Visit: Payer: Self-pay

## 2011-03-29 ENCOUNTER — Other Ambulatory Visit: Payer: Self-pay | Admitting: Family Medicine

## 2011-03-29 MED ORDER — ZOLPIDEM TARTRATE 5 MG PO TABS: 5.0000 mg | ORAL_TABLET | Freq: Every evening | ORAL | Status: DC | PRN

## 2011-05-08 ENCOUNTER — Encounter: Payer: Self-pay | Admitting: Cardiology

## 2011-06-26 ENCOUNTER — Other Ambulatory Visit: Payer: Self-pay

## 2011-06-26 MED ORDER — ATENOLOL 25 MG PO TABS: 25.0000 mg | ORAL_TABLET | Freq: Every day | ORAL | Status: DC

## 2011-06-26 MED ORDER — CHLORTHALIDONE 25 MG PO TABS: 25.0000 mg | ORAL_TABLET | Freq: Every day | ORAL | Status: DC

## 2011-07-21 ENCOUNTER — Other Ambulatory Visit (HOSPITAL_BASED_OUTPATIENT_CLINIC_OR_DEPARTMENT_OTHER): Payer: BC Managed Care – PPO | Admitting: Lab

## 2011-07-21 DIAGNOSIS — Z170 Estrogen receptor positive status [ER+]: Secondary | ICD-10-CM

## 2011-07-21 DIAGNOSIS — C50419 Malignant neoplasm of upper-outer quadrant of unspecified female breast: Secondary | ICD-10-CM

## 2011-07-21 DIAGNOSIS — M899 Disorder of bone, unspecified: Secondary | ICD-10-CM

## 2011-07-21 LAB — CBC WITH DIFFERENTIAL/PLATELET
Basophils Absolute: 0 10*3/uL (ref 0.0–0.1)
EOS%: 1.2 % (ref 0.0–7.0)
HCT: 39.6 % (ref 34.8–46.6)
HGB: 13.8 g/dL (ref 11.6–15.9)
MCH: 34.3 pg — ABNORMAL HIGH (ref 25.1–34.0)
MCV: 98.5 fL (ref 79.5–101.0)
MONO%: 5.3 % (ref 0.0–14.0)
NEUT%: 61.8 % (ref 38.4–76.8)
lymph#: 1.5 10*3/uL (ref 0.9–3.3)

## 2011-07-21 LAB — COMPREHENSIVE METABOLIC PANEL
AST: 22 U/L (ref 0–37)
BUN: 25 mg/dL — ABNORMAL HIGH (ref 6–23)
Calcium: 9.5 mg/dL (ref 8.4–10.5)
Chloride: 101 mEq/L (ref 96–112)
Creatinine, Ser: 0.71 mg/dL (ref 0.50–1.10)
Glucose, Bld: 98 mg/dL (ref 70–99)

## 2011-07-22 LAB — CANCER ANTIGEN 27.29: CA 27.29: 22 U/mL (ref 0–39)

## 2011-07-22 LAB — VITAMIN D 25 HYDROXY (VIT D DEFICIENCY, FRACTURES): Vit D, 25-Hydroxy: 66 ng/mL (ref 30–89)

## 2011-07-28 ENCOUNTER — Telehealth: Payer: Self-pay | Admitting: *Deleted

## 2011-07-28 ENCOUNTER — Ambulatory Visit (HOSPITAL_BASED_OUTPATIENT_CLINIC_OR_DEPARTMENT_OTHER): Payer: BC Managed Care – PPO | Admitting: Oncology

## 2011-07-28 VITALS — BP 114/65 | HR 60 | Temp 97.9°F | Ht 66.0 in | Wt 123.0 lb

## 2011-07-28 DIAGNOSIS — E559 Vitamin D deficiency, unspecified: Secondary | ICD-10-CM

## 2011-07-28 DIAGNOSIS — Z853 Personal history of malignant neoplasm of breast: Secondary | ICD-10-CM

## 2011-07-28 DIAGNOSIS — C50919 Malignant neoplasm of unspecified site of unspecified female breast: Secondary | ICD-10-CM

## 2011-07-28 NOTE — Telephone Encounter (Signed)
MAILED OUT CALENDAR TO INFORM THE PATIENT OF THE NEW DATE AND TIME ON 07-26-2012

## 2011-08-06 NOTE — Progress Notes (Signed)
Hematology and Oncology Follow Up Visit  Carrie [name] [id number] 03-08-1945 66 y.o. 08/06/2011 10:49 PM   DIAGNOSIS: T1CN0 , er/pr+, breast cancer diagnosed 08/23/2004, s/p xrt completed 10/06, on adjuvant hormonal therapy x 5 years  Encounter Diagnoses  Name Primary?  . Malignant neoplasm of breast (female), unspecified site Yes  . Unspecified vitamin D deficiency      PAST THERAPY: as  above   Interim History:  She has been feeling well, without complaints, works full time. She has been worked for frequent PVCs and is on tenormin.   Medications: I have reviewed the patient's current medications.  Allergies:  Allergies  Allergen Reactions  . Codeine     REACTION: upsets stomach  . Demerol     hives  . Hydrocodone-Acetaminophen     REACTION: slows respiratory rate down  . Meperidine Hcl     REACTION: Hives    Past Medical History, Surgical history, Social history, and Family History were reviewed and updated.  Review of Systems: Constitutional:  Negative for fever, chills, night sweats, anorexia, weight loss, pain. Cardiovascular: negative Respiratory: negative Neurological: negative Dermatological: negative ENT: negative Skin Gastrointestinal: negative Genito-Urinary: negative Hematological and Lymphatic: negative Breast: negative Musculoskeletal: negative Remaining ROS negative.  Physical Exam:  Blood pressure 114/65, pulse 60, temperature 97.9 F (36.6 C), temperature source Oral, height 5\' 6"  (1.676 m), weight 123 lb (55.792 kg).  ECOG: 0 HEENT:  Sclerae anicteric, conjunctivae pink.  Oropharynx clear.  No mucositis or candidiasis.  Nodes:  No cervical, supraclavicular, or axillary lymphadenopathy palpated.  Breast Exam:  Right breast is benign.  No masses, discharge, skin change, or nipple inversion.  Left breast is benign.  No masses, discharge, skin change, or nipple inversion..  Lungs:  Clear to auscultation bilaterally.  No crackles, rhonchi, or  wheezes.  Heart:  Regular rate and rhythm.  Abdomen:  Soft, nontender.  Positive bowel sounds.  No organomegaly or masses palpated.  Musculoskeletal:  No focal spinal tenderness to palpation.  Extremities:  Benign.  No peripheral edema or cyanosis.  Skin:  Benign.  Neuro:  Nonfocal.      Lab Results: Lab Results  Component Value Date   WBC 4.9 07/21/2011   HGB 13.8 07/21/2011   HCT 39.6 07/21/2011   MCV 98.5 07/21/2011   PLT 166 07/21/2011     Chemistry      Component Value Date/Time   NA 140 07/21/2011 1502   K 3.4* 07/21/2011 1502   CL 101 07/21/2011 1502   CO2 30 07/21/2011 1502   BUN 25* 07/21/2011 1502   CREATININE 0.71 07/21/2011 1502      Component Value Date/Time   CALCIUM 9.5 07/21/2011 1502   ALKPHOS 122* 07/21/2011 1502   AST 22 07/21/2011 1502   ALT 15 07/21/2011 1502   BILITOT 0.5 07/21/2011 1502       Radiological Studies:  No results found.   IMPRESSIONS AND PLAN: A 66 y.o. female with   Node negative breast cancer s/p xrt and 5 yrs of hormonal therapy, she is doing well  And will be seen yearly.  Spent more than half the time coordinating care, as well as discussion of BMI and its implications.      Carrie [name] 6/30/201310:49 PM Cell [phone]

## 2011-08-15 ENCOUNTER — Other Ambulatory Visit: Payer: Self-pay | Admitting: Dermatology

## 2011-11-02 ENCOUNTER — Other Ambulatory Visit: Payer: Self-pay | Admitting: Gynecology

## 2012-01-08 ENCOUNTER — Ambulatory Visit (INDEPENDENT_AMBULATORY_CARE_PROVIDER_SITE_OTHER): Payer: BC Managed Care – PPO | Admitting: Cardiology

## 2012-01-08 ENCOUNTER — Encounter: Payer: Self-pay | Admitting: Cardiology

## 2012-01-08 VITALS — BP 116/67 | HR 56 | Ht 66.0 in | Wt 124.2 lb

## 2012-01-08 DIAGNOSIS — K219 Gastro-esophageal reflux disease without esophagitis: Secondary | ICD-10-CM

## 2012-01-08 DIAGNOSIS — I4949 Other premature depolarization: Secondary | ICD-10-CM

## 2012-01-08 DIAGNOSIS — I10 Essential (primary) hypertension: Secondary | ICD-10-CM

## 2012-01-08 DIAGNOSIS — I493 Ventricular premature depolarization: Secondary | ICD-10-CM

## 2012-01-08 NOTE — Progress Notes (Signed)
Carrie [name] Date of Birth:  08-10-1945 [facility] [address] [place], [place]  [address] [phone]  Fax   [phone]  HPI: This pleasant 66 year old woman is seen for a one-year followup office visit.  We initially saw her in January 2013 for evaluation of PVCs.  She had an echocardiogram on 03/17/11 showing normal left ventricular systolic function with an ejection fraction of 55-65% and no significant valvular abnormalities.  There was grade 1 diastolic dysfunction.  Since last visit the patient has been taking her atenolol 12.5 mg in the evening instead of in the morning and her nocturnal PVCs have subsided.  The patient exercises 7 days a week and adheres to a good exercise regimen.  She has not been experiencing any chest pain.  She's had no awareness of racing of her heart.  Current Outpatient Prescriptions  Medication Sig Dispense Refill  . Ascorbic Acid (VITAMIN C) 500 MG tablet Take 500 mg by mouth daily.        Marland Kitchen atenolol (TENORMIN) 25 MG tablet Take 12.5 mg by mouth daily.      . Calcium-Vitamin D-Vitamin K (CALCIUM SOFT CHEWS PO) Take 1 tablet by mouth 2 (two) times daily.      . chlorthalidone (HYGROTON) 25 MG tablet Take 12.5 mg by mouth daily.      . Cholecalciferol (VITAMIN D3) 1000 UNITS CAPS Take 1 capsule by mouth daily.      . Cyanocobalamin (VITAMIN B 12 PO) Take by mouth daily.      Marland Kitchen glucosamine-chondroitin 500-400 MG tablet Take 1 tablet by mouth daily.        . Omega-3 Fatty Acids (FISH OIL) 1200 MG CAPS Take by mouth daily.        . ranitidine (ZANTAC) 300 MG tablet Take 300 mg by mouth every other day.       . Vitamins-Lipotropics (B-50) TABS Take by mouth daily.        Marland Kitchen zolpidem (AMBIEN) 5 MG tablet Take 1 tablet (5 mg total) by mouth at bedtime as needed.  30 tablet  3    Allergies  Allergen Reactions  . Codeine     REACTION: upsets stomach  . Demerol     hives  . Hydrocodone-Acetaminophen     REACTION: slows respiratory  rate down  . Meperidine Hcl     REACTION: Hives    Patient Active Problem List  Diagnosis  . MUMPS ENCEPHALITIS  . MELANOMA OF SKIN, SITE UNSPECIFIED  . HYPOKALEMIA  . COMMON MIGRAINE  . ESSENTIAL HYPERTENSION, BENIGN  . DEGENERATIVE DISC DISEASE  . OSTEOPENIA  . NEOPLASM, MALIGNANT, BREAST, HX OF  . MEASLES, HX OF  . PALPITATIONS, HX OF  . CHICKENPOX, HX OF  . Preventative health care    History  Smoking status  . Former Smoker -- 0.2 packs/day  . Quit date: 02/07/1968  Smokeless tobacco  . Not on file    History  Alcohol Use  . 0.0 oz/week  . 0 drink(s) per week    Family History  Problem Relation Age of Onset  . Hypertension Mother   . COPD Mother   . Alcohol abuse Father     history of  . Coronary artery disease Father   . Osteoarthritis Brother     knees  . Basal cell carcinoma Sister   . Heart disease Maternal Grandmother   . Stroke Maternal Grandfather   . Osteoarthritis Sister     knees  . Migraines Sister  Review of Systems: The patient denies any heat or cold intolerance.  No weight gain or weight loss.  The patient denies headaches or blurry vision.  There is no cough or sputum production.  The patient denies dizziness.  There is no hematuria or hematochezia.  The patient denies any muscle aches or arthritis.  The patient denies any rash.  The patient denies frequent falling or instability.  There is no history of depression or anxiety.  All other systems were reviewed and are negative.   Physical Exam: Filed Vitals:   01/08/12 1432  BP: 116/67  Pulse: 56   the general appearance reveals a well-developed well-nourished woman in no distress.The head and neck exam reveals pupils equal and reactive.  Extraocular movements are full.  There is no scleral icterus.  The mouth and pharynx are normal.  The neck is supple.  The carotids reveal no bruits.  The jugular venous pressure is normal.  The  thyroid is not enlarged.  There is no  lymphadenopathy.  The chest is clear to percussion and auscultation.  There are no rales or rhonchi.  Expansion of the chest is symmetrical.  The precordium is quiet.  The first heart sound is normal.  The second heart sound is physiologically split.  There is no murmur gallop rub or click.  There is no abnormal lift or heave.  The abdomen is soft and nontender.  The bowel sounds are normal.  The liver and spleen are not enlarged.  There are no abdominal masses.  There are no abdominal bruits.  Extremities reveal good pedal pulses.  There is no phlebitis or edema.  There is no cyanosis or clubbing.  Strength is normal and symmetrical in all extremities.  There is no lateralizing weakness.  There are no sensory deficits.  The skin is warm and dry.  There is no rash.  EKG today shows normal sinus rhythm and is within normal limits.  No PVCs are seen    Assessment / Plan: Continue same medication.  I do not hear a midsystolic click today.  She exercises faithfully every morning before going to work which is in [place].  She works for the [employer]. Recheck here in one year for followup office visit EKG

## 2012-01-08 NOTE — Assessment & Plan Note (Signed)
The patient takes ranitidine as necessary for her symptoms of GERD which respond well

## 2012-01-08 NOTE — Assessment & Plan Note (Signed)
The patient has a past history of high blood pressure.  She developed this while she was on Arimidex for breast cancer prophylaxis treatment.  She is no longer on Arimidex but she has continued to take low-dose chlorthalidone and her blood pressure has remained stable.

## 2012-01-08 NOTE — Patient Instructions (Addendum)
Your physician recommends that you continue on your current medications as directed. Please refer to the Current Medication list given to you today.  Your physician wants you to follow-up in: 1 year. You will receive a reminder letter in the mail two months in advance. If you don't receive a letter, please call our office to schedule the follow-up appointment.  

## 2012-01-11 ENCOUNTER — Telehealth: Payer: Self-pay | Admitting: *Deleted

## 2012-01-11 NOTE — Telephone Encounter (Signed)
Mailed out calendar to inform the patient of the new date and time of the lab only and then the md one week later

## 2012-03-27 ENCOUNTER — Telehealth: Payer: Self-pay | Admitting: Oncology

## 2012-03-27 NOTE — Telephone Encounter (Signed)
Pt called asking when she will be rescheduled with another doctor since Dr. Donnie Coffin has left the practice.   She would like to see Dr. Darnelle Catalan.   She is scheduled to see Dr> Donnie Coffin in follow up in July and I explained calls were being made chronologically and if acceptable, we will give her a call later to get her scheduled.   She is ok with that plan and knows to call back should she have any needs or questions.

## 2012-03-29 ENCOUNTER — Telehealth: Payer: Self-pay | Admitting: *Deleted

## 2012-03-29 NOTE — Telephone Encounter (Signed)
Patient called reporting she has a swelling in her left lower abdomen near her groin.  Concerned about this being a lymph node and wouldlike this evaluated due to her cancer.  It's like a bubble.  I don't see my GP for another three weeks and don't think this should wait.  Can be reached at [phone].  Will notify [facility].

## 2012-04-27 ENCOUNTER — Encounter: Payer: Self-pay | Admitting: Oncology

## 2012-04-27 ENCOUNTER — Telehealth: Payer: Self-pay | Admitting: Oncology

## 2012-04-27 NOTE — Telephone Encounter (Signed)
S/w the pt and she is aware of her appts beiong reassigned from dr Donnie Coffin to dr Darnelle Catalan will mail the pt an appt calendar with the changes.

## 2012-05-04 ENCOUNTER — Encounter: Payer: Self-pay | Admitting: Oncology

## 2012-05-07 HISTORY — PX: HERNIA REPAIR: SHX51

## 2012-07-19 ENCOUNTER — Other Ambulatory Visit: Payer: BC Managed Care – PPO | Admitting: Lab

## 2012-07-26 ENCOUNTER — Ambulatory Visit: Payer: BC Managed Care – PPO | Admitting: Oncology

## 2012-07-30 ENCOUNTER — Other Ambulatory Visit (HOSPITAL_BASED_OUTPATIENT_CLINIC_OR_DEPARTMENT_OTHER): Payer: BC Managed Care – PPO | Admitting: Lab

## 2012-07-30 ENCOUNTER — Other Ambulatory Visit: Payer: Self-pay | Admitting: Family

## 2012-07-30 DIAGNOSIS — Z853 Personal history of malignant neoplasm of breast: Secondary | ICD-10-CM

## 2012-07-30 LAB — CBC WITH DIFFERENTIAL/PLATELET
Basophils Absolute: 0 10*3/uL (ref 0.0–0.1)
EOS%: 0.9 % (ref 0.0–7.0)
Eosinophils Absolute: 0 10*3/uL (ref 0.0–0.5)
HCT: 39.7 % (ref 34.8–46.6)
HGB: 13.6 g/dL (ref 11.6–15.9)
MCH: 34.5 pg — ABNORMAL HIGH (ref 25.1–34.0)
MCV: 100.5 fL (ref 79.5–101.0)
NEUT#: 3.3 10*3/uL (ref 1.5–6.5)
NEUT%: 61.8 % (ref 38.4–76.8)
RDW: 12.9 % (ref 11.2–14.5)
lymph#: 1.6 10*3/uL (ref 0.9–3.3)

## 2012-07-30 LAB — COMPREHENSIVE METABOLIC PANEL (CC13)
ALT: 12 U/L (ref 0–55)
Albumin: 3.6 g/dL (ref 3.5–5.0)
CO2: 32 mEq/L — ABNORMAL HIGH (ref 22–29)
Calcium: 9.4 mg/dL (ref 8.4–10.4)
Chloride: 103 mEq/L (ref 98–107)
Glucose: 92 mg/dl (ref 70–99)
Sodium: 141 mEq/L (ref 136–145)
Total Bilirubin: 0.64 mg/dL (ref 0.20–1.20)
Total Protein: 6.4 g/dL (ref 6.4–8.3)

## 2012-07-30 LAB — LACTATE DEHYDROGENASE (CC13): LDH: 144 U/L (ref 125–245)

## 2012-07-31 ENCOUNTER — Telehealth: Payer: Self-pay

## 2012-07-31 NOTE — Telephone Encounter (Signed)
LVMOM regarding lab results for 6/24. Per [facility], labs are ok. Continue taking Vit D. Call the office with any questions. TMB

## 2012-08-06 ENCOUNTER — Ambulatory Visit: Payer: BC Managed Care – PPO | Admitting: Oncology

## 2012-08-06 ENCOUNTER — Encounter: Payer: Self-pay | Admitting: Family

## 2012-08-06 ENCOUNTER — Ambulatory Visit (HOSPITAL_BASED_OUTPATIENT_CLINIC_OR_DEPARTMENT_OTHER): Payer: BC Managed Care – PPO | Admitting: Family

## 2012-08-06 VITALS — BP 126/72 | HR 76 | Temp 98.2°F | Resp 20 | Ht 66.0 in | Wt 124.5 lb

## 2012-08-06 DIAGNOSIS — IMO0002 Reserved for concepts with insufficient information to code with codable children: Secondary | ICD-10-CM

## 2012-08-06 DIAGNOSIS — Z853 Personal history of malignant neoplasm of breast: Secondary | ICD-10-CM

## 2012-08-06 MED ORDER — LIDOCAINE 4 % EX FOAM: 1.0000 "application " | CUTANEOUS | Status: DC | PRN

## 2012-08-06 NOTE — Patient Instructions (Addendum)
Please contact us at [phone] if you have any questions or concerns.  Please continue to do well and enjoy life!!!  Get plenty of rest, drink plenty of water, exercise daily, eat a balanced diet.  Continue to take calcium and Vitamin D3 daily.  Complete monthly self-breast examinations.  Have a clinical breast exam by a physician every year.  Have your mammogram completed every year.  Check out online "vaginal dilator."  Consider coconut oil and vitamin E for vaginal dryness.  Call Vikki Ports RN with Dr. Darnelle Catalan [phone].  Results for orders placed in visit on 07/30/12 (from the past 336 hour(s))  CBC WITH DIFFERENTIAL   Collection Time    07/30/12  3:09 PM      Result Value Range   WBC 5.3  3.9 - 10.3 10e3/uL   NEUT# 3.3  1.5 - 6.5 10e3/uL   HGB 13.6  11.6 - 15.9 g/dL   HCT 82.9  56.2 - 13.0 %   Platelets 160  145 - 400 10e3/uL   MCV 100.5  79.5 - 101.0 fL   MCH 34.5 (*) 25.1 - 34.0 pg   MCHC 34.4  31.5 - 36.0 g/dL   RBC 8.65  7.84 - 6.96 10e6/uL   RDW 12.9  11.2 - 14.5 %   lymph# 1.6  0.9 - 3.3 10e3/uL   MONO# 0.3  0.1 - 0.9 10e3/uL   Eosinophils Absolute 0.0  0.0 - 0.5 10e3/uL   Basophils Absolute 0.0  0.0 - 0.1 10e3/uL   NEUT% 61.8  38.4 - 76.8 %   LYMPH% 30.2  14.0 - 49.7 %   MONO% 6.5  0.0 - 14.0 %   EOS% 0.9  0.0 - 7.0 %   BASO% 0.6  0.0 - 2.0 %  VITAMIN D 25 HYDROXY   Collection Time    07/30/12  3:09 PM      Result Value Range   Vit D, 25-Hydroxy 71  30 - 89 ng/mL  LACTATE DEHYDROGENASE (CC13)   Collection Time    07/30/12  3:09 PM      Result Value Range   LDH 144  125 - 245 U/L  COMPREHENSIVE METABOLIC PANEL (CC13)   Collection Time    07/30/12  3:09 PM      Result Value Range   Sodium 141  136 - 145 mEq/L   Potassium 4.0  3.5 - 5.1 mEq/L   Chloride 103  98 - 107 mEq/L   CO2 32 (*) 22 - 29 mEq/L   Glucose 92  70 - 99 mg/dl   BUN 29.5  7.0 - 28.4 mg/dL   Creatinine 0.7  0.6 - 1.1 mg/dL   Total Bilirubin 1.32  0.20 - 1.20 mg/dL   Alkaline Phosphatase 127  40 - 150 U/L   AST 17  5 - 34 U/L   ALT 12  0 - 55 U/L   Total Protein 6.4  6.4 - 8.3 g/dL   Albumin 3.6  3.5 - 5.0 g/dL   Calcium 9.4  8.4 - 44.0 mg/dL
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
## 2013-01-17 ENCOUNTER — Ambulatory Visit (INDEPENDENT_AMBULATORY_CARE_PROVIDER_SITE_OTHER): Payer: BC Managed Care – PPO | Admitting: Cardiology

## 2013-01-17 ENCOUNTER — Encounter: Payer: Self-pay | Admitting: Cardiology

## 2013-01-17 VITALS — BP 113/56 | HR 47 | Ht 65.0 in | Wt 123.0 lb

## 2013-01-17 DIAGNOSIS — I059 Rheumatic mitral valve disease, unspecified: Secondary | ICD-10-CM

## 2013-01-17 DIAGNOSIS — Z8679 Personal history of other diseases of the circulatory system: Secondary | ICD-10-CM

## 2013-01-17 DIAGNOSIS — I4949 Other premature depolarization: Secondary | ICD-10-CM

## 2013-01-17 DIAGNOSIS — I493 Ventricular premature depolarization: Secondary | ICD-10-CM

## 2013-01-17 DIAGNOSIS — I10 Essential (primary) hypertension: Secondary | ICD-10-CM

## 2013-01-17 DIAGNOSIS — I341 Nonrheumatic mitral (valve) prolapse: Secondary | ICD-10-CM

## 2013-01-17 NOTE — Progress Notes (Signed)
Carrie [name] Date of Birth:  12-Jan-1946 [address] [place], [place]  [address] [phone]  Fax   [phone]  HPI: This pleasant 67 year old woman is seen for a one-year followup office visit.  We initially saw her in January 2013 for evaluation of PVCs.  She had an echocardiogram on 03/17/11 showing normal left ventricular systolic function with an ejection fraction of 55-65% and no significant valvular abnormalities.  There was grade 1 diastolic dysfunction.  Since last visit the patient has been taking her atenolol 12.5 mg in the evening instead of in the morning and her nocturnal PVCs have subsided.  The patient exercises 7 days a week and adheres to a good exercise regimen.  She has not been experiencing any chest pain.  She's had no awareness of racing of her heart.  Dr. Pearla Dubonnet in [place] is now her PCP.  Current Outpatient Prescriptions  Medication Sig Dispense Refill  . Ascorbic Acid (VITAMIN C) 1000 MG tablet Take 1,000 mg by mouth daily.      Marland Kitchen atenolol (TENORMIN) 25 MG tablet Take 12.5 mg by mouth daily.      . Calcium-Vitamin D-Vitamin K (CALCIUM SOFT CHEWS PO) Take 1 tablet by mouth 2 (two) times daily.      . chlorthalidone (HYGROTON) 25 MG tablet Take 12.5 mg by mouth daily.      . Cholecalciferol (VITAMIN D3) 1000 UNITS CAPS Take 1 capsule by mouth daily.      . Cyanocobalamin (VITAMIN B 12 PO) Take 1,000 mg by mouth daily.       Marland Kitchen glucosamine-chondroitin 500-400 MG tablet Take 1 tablet by mouth daily.        . Omega-3 Fatty Acids (FISH OIL) 1200 MG CAPS Take 1,200 mg by mouth 2 (two) times daily.       . ranitidine (ZANTAC) 300 MG tablet Take 300 mg by mouth as needed.       . Vitamins-Lipotropics (B-50) TABS Take 1 tablet by mouth daily.        No current facility-administered medications for this visit.    Allergies  Allergen Reactions  . Codeine     REACTION: upsets stomach  . Demerol     hives  . Hydrocodone-Acetaminophen     REACTION: slows respiratory rate down  . Meperidine Hcl     REACTION: Hives    Patient Active Problem List   Diagnosis Date Noted  . GERD (gastroesophageal reflux disease) 01/08/2012  . Preventative health care 07/28/2010  . MUMPS ENCEPHALITIS 03/03/2010  . MELANOMA OF SKIN, SITE UNSPECIFIED 03/03/2010  . HYPOKALEMIA 03/03/2010  . COMMON MIGRAINE 03/03/2010  . ESSENTIAL HYPERTENSION, BENIGN 03/03/2010  . DEGENERATIVE DISC DISEASE 03/03/2010  . OSTEOPENIA 03/03/2010  . NEOPLASM, MALIGNANT, BREAST, HX OF 03/03/2010  . MEASLES, HX OF 03/03/2010  . PALPITATIONS, HX OF 03/03/2010  . CHICKENPOX, HX OF 03/03/2010    History  Smoking status  . Former Smoker -- 0.25 packs/day  . Quit date: 02/07/1968  Smokeless tobacco  . Never Used    History  Alcohol Use  . 3.0 oz/week  . 5 Glasses of wine per week    Family History  Problem Relation Age of Onset  . Hypertension Mother   . COPD Mother   . Alcohol abuse Father     history of  . Coronary artery disease Father   . Osteoarthritis Brother     knees  . Basal cell carcinoma Sister   . Heart disease  Maternal Grandmother   . Stroke Maternal Grandfather   . Osteoarthritis Sister     knees  . Migraines Sister     Review of Systems: The patient denies any heat or cold intolerance.  No weight gain or weight loss.  The patient denies headaches or blurry vision.  There is no cough or sputum production.  The patient denies dizziness.  There is no hematuria or hematochezia.  The patient denies any muscle aches or arthritis.  The patient denies any rash.  The patient denies frequent falling or instability.  There is no history of depression or anxiety.  All other systems were reviewed and are negative.   Physical Exam: Filed Vitals:   01/17/13 1106  BP: 113/56  Pulse: 47   the general appearance reveals a well-developed well-nourished woman in no distress.The head and neck exam reveals pupils equal and reactive.  Extraocular  movements are full.  There is no scleral icterus.  The mouth and pharynx are normal.  The neck is supple.  The carotids reveal no bruits.  The jugular venous pressure is normal.  The  thyroid is not enlarged.  There is no lymphadenopathy.  The chest is clear to percussion and auscultation.  There are no rales or rhonchi.  Expansion of the chest is symmetrical.  The precordium is quiet.  The first heart sound is normal.  The second heart sound is physiologically split.  There is no murmur gallop rub or click.  There is no abnormal lift or heave.  The abdomen is soft and nontender.  The bowel sounds are normal.  The liver and spleen are not enlarged.  There are no abdominal masses.  There are no abdominal bruits.  Extremities reveal good pedal pulses.  There is no phlebitis or edema.  There is no cyanosis or clubbing.  Strength is normal and symmetrical in all extremities.  There is no lateralizing weakness.  There are no sensory deficits.  The skin is warm and dry.  There is no rash.  EKG today shows normal sinus rhythm and is within normal limits.  No PVCs are seen  EKG today shows marked sinus bradycardia 47 per minute.  No ischemic changes.  Since 01/08/12, no change except rate is slower  Assessment / Plan: Continue same medication.  I do not hear a midsystolic click today.  She exercises faithfully every morning before going to work which is in [place].  She works for the [employer]. Recheck here in one year for followup office visit EKG

## 2013-01-17 NOTE — Assessment & Plan Note (Signed)
Blood pressure was remaining stable on current therapy.  No dizziness or syncope.  No headaches 

## 2013-01-17 NOTE — Patient Instructions (Signed)
Your physician recommends that you continue on your current medications as directed. Please refer to the Current Medication list given to you today.  Your physician wants you to follow-up in: 1 year ov You will receive a reminder letter in the mail two months in advance. If you don't receive a letter, please call our office to schedule the follow-up appointment.  

## 2013-01-17 NOTE — Assessment & Plan Note (Signed)
The patient has not been expressing any chest pain or palpitations.  She has not been aware of any recent PVCs.

## 2013-02-25 ENCOUNTER — Encounter: Payer: BC Managed Care – PPO | Admitting: Vascular Surgery

## 2013-02-25 ENCOUNTER — Encounter: Payer: Self-pay | Admitting: *Deleted

## 2013-02-25 ENCOUNTER — Encounter (HOSPITAL_COMMUNITY): Payer: BC Managed Care – PPO

## 2013-02-26 ENCOUNTER — Ambulatory Visit (INDEPENDENT_AMBULATORY_CARE_PROVIDER_SITE_OTHER): Payer: Self-pay | Admitting: *Deleted

## 2013-02-26 ENCOUNTER — Encounter (INDEPENDENT_AMBULATORY_CARE_PROVIDER_SITE_OTHER): Payer: Self-pay

## 2013-02-26 DIAGNOSIS — I781 Nevus, non-neoplastic: Secondary | ICD-10-CM | POA: Insufficient documentation

## 2013-02-26 NOTE — Progress Notes (Signed)
X=.5% Asclera administered with a 27g butterfly.  Patient received a total of 5cc foam.  Able to treat all areas of concern for this very appreciative patient. Tol proced well and anticipate good results. Will follow prn.  Photos: yes  Compression stockings applied: yes

## 2013-02-28 ENCOUNTER — Encounter: Payer: Self-pay | Admitting: Vascular Surgery

## 2013-09-01 ENCOUNTER — Other Ambulatory Visit: Payer: Self-pay | Admitting: Dermatology

## 2013-10-15 ENCOUNTER — Other Ambulatory Visit: Payer: Self-pay | Admitting: Dermatology

## 2013-10-15 DIAGNOSIS — C44519 Basal cell carcinoma of skin of other part of trunk: Secondary | ICD-10-CM | POA: Diagnosis not present

## 2013-10-15 DIAGNOSIS — Z85820 Personal history of malignant melanoma of skin: Secondary | ICD-10-CM | POA: Diagnosis not present

## 2013-10-15 DIAGNOSIS — L570 Actinic keratosis: Secondary | ICD-10-CM | POA: Diagnosis not present

## 2013-10-15 DIAGNOSIS — L821 Other seborrheic keratosis: Secondary | ICD-10-CM | POA: Diagnosis not present

## 2013-10-15 DIAGNOSIS — Z85828 Personal history of other malignant neoplasm of skin: Secondary | ICD-10-CM | POA: Diagnosis not present

## 2013-10-15 DIAGNOSIS — D485 Neoplasm of uncertain behavior of skin: Secondary | ICD-10-CM | POA: Diagnosis not present

## 2013-10-15 DIAGNOSIS — C44721 Squamous cell carcinoma of skin of unspecified lower limb, including hip: Secondary | ICD-10-CM | POA: Diagnosis not present

## 2013-12-12 DIAGNOSIS — Z23 Encounter for immunization: Secondary | ICD-10-CM | POA: Diagnosis not present

## 2014-01-20 ENCOUNTER — Encounter: Payer: Self-pay | Admitting: Cardiology

## 2014-01-20 ENCOUNTER — Ambulatory Visit (INDEPENDENT_AMBULATORY_CARE_PROVIDER_SITE_OTHER): Payer: Medicare Other | Admitting: Cardiology

## 2014-01-20 VITALS — BP 108/72 | HR 57 | Ht 66.0 in | Wt 124.0 lb

## 2014-01-20 DIAGNOSIS — K219 Gastro-esophageal reflux disease without esophagitis: Secondary | ICD-10-CM

## 2014-01-20 DIAGNOSIS — I493 Ventricular premature depolarization: Secondary | ICD-10-CM | POA: Diagnosis not present

## 2014-01-20 DIAGNOSIS — I341 Nonrheumatic mitral (valve) prolapse: Secondary | ICD-10-CM

## 2014-01-20 DIAGNOSIS — I10 Essential (primary) hypertension: Secondary | ICD-10-CM | POA: Diagnosis not present

## 2014-01-20 NOTE — Progress Notes (Signed)
Carrie [name] Date of Birth:  1946-01-21 [facility] [address] [place], [place]  [address] [phone]        Fax   [phone]   History of Present Illness: This pleasant 68 year old woman is seen for a one-year followup office visit. We initially saw her in January 2013 for evaluation of PVCs. She had an echocardiogram on 03/17/11 showing normal left ventricular systolic function with an ejection fraction of 55-65% and no significant valvular abnormalities. There was grade 1 diastolic dysfunction. Since last visit the patient has been taking her atenolol 12.5 mg in the evening instead of in the morning and her nocturnal PVCs have subsided. The patient exercises 7 days a week and adheres to a good exercise regimen. She has not been experiencing any chest pain. She's had no awareness of racing of her heart. Dr. Elenora Fender in [place] was her former PCP but has retired.  Patient is in the process of getting a new PCP  Current Outpatient Prescriptions  Medication Sig Dispense Refill  . Ascorbic Acid (VITAMIN C) 1000 MG tablet Take 1,000 mg by mouth daily.    Marland Kitchen atenolol (TENORMIN) 25 MG tablet Take 12.5 mg by mouth daily.    . Calcium-Vitamin D-Vitamin K (CALCIUM SOFT CHEWS PO) Take 1 tablet by mouth 2 (two) times daily.    . chlorthalidone (HYGROTON) 25 MG tablet Take 12.5 mg by mouth daily.    . Cholecalciferol (VITAMIN D3) 1000 UNITS CAPS Take 1 capsule by mouth daily.    . Cyanocobalamin (VITAMIN B 12 PO) Take 1,000 mg by mouth daily.     Marland Kitchen glucosamine-chondroitin 500-400 MG tablet Take 1 tablet by mouth daily.      . Omega-3 Fatty Acids (FISH OIL) 1200 MG CAPS Take 1,200 mg by mouth 2 (two) times daily.     . ranitidine (ZANTAC) 300 MG tablet Take 300 mg by mouth as needed.     . Vitamins-Lipotropics (B-50) TABS Take 1 tablet by mouth daily.      No current facility-administered medications for this visit.    Allergies  Allergen Reactions    . Codeine     REACTION: upsets stomach  . Demerol     hives  . Hydrocodone-Acetaminophen     REACTION: slows respiratory rate down  . Meperidine Hcl     REACTION: Hives    Patient Active Problem List   Diagnosis Date Noted  . Spider veins 02/26/2013  . GERD (gastroesophageal reflux disease) 01/08/2012  . Preventative health care 07/28/2010  . MUMPS ENCEPHALITIS 03/03/2010  . MELANOMA OF SKIN, SITE UNSPECIFIED 03/03/2010  . HYPOKALEMIA 03/03/2010  . COMMON MIGRAINE 03/03/2010  . ESSENTIAL HYPERTENSION, BENIGN 03/03/2010  . DEGENERATIVE DISC DISEASE 03/03/2010  . OSTEOPENIA 03/03/2010  . NEOPLASM, MALIGNANT, BREAST, HX OF 03/03/2010  . MEASLES, HX OF 03/03/2010  . PALPITATIONS, HX OF 03/03/2010  . CHICKENPOX, HX OF 03/03/2010    History  Smoking status  . Former Smoker -- 0.25 packs/day  . Quit date: 02/07/1968  Smokeless tobacco  . Never Used    History  Alcohol Use  . 3.0 oz/week  . 5 Glasses of wine per week    Family History  Problem Relation Age of Onset  . Hypertension Mother   . COPD Mother   . Alcohol abuse Father     history of  . Coronary artery disease Father   . Osteoarthritis Brother     knees  . Basal cell carcinoma  Sister   . Heart disease Maternal Grandmother   . Stroke Maternal Grandfather   . Osteoarthritis Sister     knees  . Migraines Sister     Review of Systems: Constitutional: no fever chills diaphoresis or fatigue or change in weight.  Head and neck: no hearing loss, no epistaxis, no photophobia or visual disturbance. Respiratory: No cough, shortness of breath or wheezing. Cardiovascular: No chest pain peripheral edema, palpitations. Gastrointestinal: No abdominal distention, no abdominal pain, no change in bowel habits hematochezia or melena. Genitourinary: No dysuria, no frequency, no urgency, no nocturia. Musculoskeletal:No arthralgias, no back pain, no gait disturbance or myalgias. Neurological: No dizziness, no headaches,  no numbness, no seizures, no syncope, no weakness, no tremors. Hematologic: No lymphadenopathy, no easy bruising. Psychiatric: No confusion, no hallucinations, no sleep disturbance.   Wt Readings from Last 3 Encounters:  01/20/14 124 lb (56.246 kg)  01/17/13 123 lb (55.792 kg)  08/06/12 124 lb 8 oz (56.473 kg)    Physical Exam: Filed Vitals:   01/20/14 1409  BP: 108/72  Pulse: 57  The patient appears to be in no distress.  Head and neck exam reveals that the pupils are equal and reactive.  The extraocular movements are full.  There is no scleral icterus.  Mouth and pharynx are benign.  No lymphadenopathy.  No carotid bruits.  The jugular venous pressure is normal.  Thyroid is not enlarged or tender.  Chest is clear to percussion and auscultation.  No rales or rhonchi.  Expansion of the chest is symmetrical.  Heart reveals no abnormal lift or heave.  First and second heart sounds are normal.  There is no murmur gallop rub or click.  No click heard today  The abdomen is soft and nontender.  Bowel sounds are normoactive.  There is no hepatosplenomegaly or mass.  There are no abdominal bruits.  Extremities reveal no phlebitis or edema.  Pedal pulses are good.  There is no cyanosis or clubbing.  Neurologic exam is normal strength and no lateralizing weakness.  No sensory deficits.  Integument reveals no rash  EKG shows normal sinus rhythm and is within normal limits.  No PVCs are seen.  Assessment / Plan: 1.  Good general health 2.  Past history of PVCs 3.  Essential hypertension without heart failure 4.  GERD  Disposition: Continue same medication.  Recheck in one year for follow-up office visit and EKG

## 2014-01-20 NOTE — Assessment & Plan Note (Signed)
The patient is on ranitidine for symptoms of acid reflux

## 2014-01-20 NOTE — Assessment & Plan Note (Signed)
The patient is not having any headaches or dizzy spells.  No shortness of breath.

## 2014-01-20 NOTE — Patient Instructions (Signed)
Your physician recommends that you continue on your current medications as directed. Please refer to the Current Medication list given to you today.  Your physician wants you to follow-up in: 1 year ov/ekg You will receive a reminder letter in the mail two months in advance. If you don't receive a letter, please call our office to schedule the follow-up appointment.  

## 2014-01-20 NOTE — Assessment & Plan Note (Signed)
The patient has not been aware of any PVCs or palpitations.

## 2014-03-11 ENCOUNTER — Other Ambulatory Visit: Payer: Self-pay | Admitting: Gynecology

## 2014-03-11 DIAGNOSIS — Z853 Personal history of malignant neoplasm of breast: Secondary | ICD-10-CM | POA: Diagnosis not present

## 2014-03-11 DIAGNOSIS — Z1231 Encounter for screening mammogram for malignant neoplasm of breast: Secondary | ICD-10-CM | POA: Diagnosis not present

## 2014-03-11 DIAGNOSIS — Z124 Encounter for screening for malignant neoplasm of cervix: Secondary | ICD-10-CM | POA: Diagnosis not present

## 2014-03-11 DIAGNOSIS — Z1212 Encounter for screening for malignant neoplasm of rectum: Secondary | ICD-10-CM | POA: Diagnosis not present

## 2014-03-11 DIAGNOSIS — Z01419 Encounter for gynecological examination (general) (routine) without abnormal findings: Secondary | ICD-10-CM | POA: Diagnosis not present

## 2014-03-13 LAB — CYTOLOGY - PAP

## 2014-04-15 ENCOUNTER — Other Ambulatory Visit: Payer: Self-pay | Admitting: Dermatology

## 2014-04-15 DIAGNOSIS — L720 Epidermal cyst: Secondary | ICD-10-CM | POA: Diagnosis not present

## 2014-04-15 DIAGNOSIS — D485 Neoplasm of uncertain behavior of skin: Secondary | ICD-10-CM | POA: Diagnosis not present

## 2014-04-15 DIAGNOSIS — D225 Melanocytic nevi of trunk: Secondary | ICD-10-CM | POA: Diagnosis not present

## 2014-04-15 DIAGNOSIS — L570 Actinic keratosis: Secondary | ICD-10-CM | POA: Diagnosis not present

## 2014-04-15 DIAGNOSIS — C44712 Basal cell carcinoma of skin of right lower limb, including hip: Secondary | ICD-10-CM | POA: Diagnosis not present

## 2014-04-15 DIAGNOSIS — Z85828 Personal history of other malignant neoplasm of skin: Secondary | ICD-10-CM | POA: Diagnosis not present

## 2014-04-15 DIAGNOSIS — Z85820 Personal history of malignant melanoma of skin: Secondary | ICD-10-CM | POA: Diagnosis not present

## 2014-07-02 ENCOUNTER — Encounter: Payer: Self-pay | Admitting: *Deleted

## 2014-07-02 ENCOUNTER — Telehealth: Payer: Self-pay | Admitting: *Deleted

## 2014-07-02 NOTE — Telephone Encounter (Signed)
Pre-Visit Call completed with patient and chart updated.   Pre-Visit Info documented in Specialty Comments under [employer].    

## 2014-07-03 ENCOUNTER — Encounter: Payer: Self-pay | Admitting: Family Medicine

## 2014-07-03 ENCOUNTER — Ambulatory Visit (INDEPENDENT_AMBULATORY_CARE_PROVIDER_SITE_OTHER): Payer: Medicare Other | Admitting: Family Medicine

## 2014-07-03 VITALS — BP 110/68 | HR 59 | Temp 97.0°F | Ht 65.5 in | Wt 125.1 lb

## 2014-07-03 DIAGNOSIS — I10 Essential (primary) hypertension: Secondary | ICD-10-CM | POA: Diagnosis not present

## 2014-07-03 DIAGNOSIS — M8580 Other specified disorders of bone density and structure, unspecified site: Secondary | ICD-10-CM | POA: Diagnosis not present

## 2014-07-03 DIAGNOSIS — I493 Ventricular premature depolarization: Secondary | ICD-10-CM | POA: Diagnosis not present

## 2014-07-03 DIAGNOSIS — R5383 Other fatigue: Secondary | ICD-10-CM | POA: Diagnosis not present

## 2014-07-03 DIAGNOSIS — K219 Gastro-esophageal reflux disease without esophagitis: Secondary | ICD-10-CM

## 2014-07-03 DIAGNOSIS — G43009 Migraine without aura, not intractable, without status migrainosus: Secondary | ICD-10-CM

## 2014-07-03 DIAGNOSIS — Z5181 Encounter for therapeutic drug level monitoring: Secondary | ICD-10-CM | POA: Diagnosis not present

## 2014-07-03 LAB — CBC
HCT: 41.1 % (ref 36.0–46.0)
HEMOGLOBIN: 14.1 g/dL (ref 12.0–15.0)
MCH: 34.6 pg — ABNORMAL HIGH (ref 26.0–34.0)
MCHC: 34.3 g/dL (ref 30.0–36.0)
MCV: 101 fL — ABNORMAL HIGH (ref 78.0–100.0)
MPV: 9.5 fL (ref 8.6–12.4)
Platelets: 165 10*3/uL (ref 150–400)
RBC: 4.07 MIL/uL (ref 3.87–5.11)
RDW: 12.9 % (ref 11.5–15.5)
WBC: 5.8 10*3/uL (ref 4.0–10.5)

## 2014-07-03 LAB — COMPREHENSIVE METABOLIC PANEL
ALT: 13 U/L (ref 0–35)
AST: 18 U/L (ref 0–37)
Albumin: 4.2 g/dL (ref 3.5–5.2)
Alkaline Phosphatase: 111 U/L (ref 39–117)
BUN: 22 mg/dL (ref 6–23)
CO2: 29 mEq/L (ref 19–32)
CREATININE: 0.53 mg/dL (ref 0.50–1.10)
Calcium: 9 mg/dL (ref 8.4–10.5)
Chloride: 99 mEq/L (ref 96–112)
GLUCOSE: 96 mg/dL (ref 70–99)
Potassium: 3.6 mEq/L (ref 3.5–5.3)
Sodium: 138 mEq/L (ref 135–145)
Total Bilirubin: 0.5 mg/dL (ref 0.2–1.2)
Total Protein: 6.4 g/dL (ref 6.0–8.3)

## 2014-07-03 LAB — MAGNESIUM: Magnesium: 1.9 mg/dL (ref 1.5–2.5)

## 2014-07-03 LAB — LIPID PANEL
Cholesterol: 210 mg/dL — ABNORMAL HIGH (ref 0–200)
HDL: 102 mg/dL (ref >=46)
LDL CALC: 84 mg/dL (ref 0–99)
TRIGLYCERIDES: 119 mg/dL (ref <150)
Total CHOL/HDL Ratio: 2.1 Ratio
VLDL: 24 mg/dL (ref 0–40)

## 2014-07-03 LAB — TSH: TSH: 0.81 u[IU]/mL (ref 0.350–4.500)

## 2014-07-03 LAB — VITAMIN B12: Vitamin B-12: >2000 pg/mL — ABNORMAL HIGH (ref 211–911)

## 2014-07-03 MED ORDER — CHLORTHALIDONE 25 MG PO TABS: 12.5000 mg | ORAL_TABLET | Freq: Every day | ORAL | Status: DC

## 2014-07-03 MED ORDER — RANITIDINE HCL 300 MG PO TABS: 300.0000 mg | ORAL_TABLET | ORAL | Status: DC | PRN

## 2014-07-03 MED ORDER — ATENOLOL 25 MG PO TABS: 12.5000 mg | ORAL_TABLET | Freq: Every day | ORAL | Status: DC

## 2014-07-03 NOTE — Assessment & Plan Note (Signed)
Well controlled, no changes to meds. Encouraged heart healthy diet such as the DASH diet and exercise as tolerated.  °

## 2014-07-03 NOTE — Progress Notes (Signed)
Pre visit review using our clinic review tool, if applicable. No additional management support is needed unless otherwise documented below in the visit note. 

## 2014-07-03 NOTE — Patient Instructions (Signed)
Rel of Rec Raeanne Gathers, MD at [facility] in [place]   Hypertension Hypertension, commonly called high blood pressure, is when the force of blood pumping through your arteries is too strong. Your arteries are the blood vessels that carry blood from your heart throughout your body. A blood pressure reading consists of a higher number over a lower number, such as 110/72. The higher number (systolic) is the pressure inside your arteries when your heart pumps. The lower number (diastolic) is the pressure inside your arteries when your heart relaxes. Ideally you want your blood pressure below 120/80. Hypertension forces your heart to work harder to pump blood. Your arteries may become narrow or stiff. Having hypertension puts you at risk for heart disease, stroke, and other problems.  RISK FACTORS Some risk factors for high blood pressure are controllable. Others are not.  Risk factors you cannot control include:   Race. You may be at higher risk if you are African American.  Age. Risk increases with age.  Gender. Men are at higher risk than women before age 102 years. After age 74, women are at higher risk than men. Risk factors you can control include:  Not getting enough exercise or physical activity.  Being overweight.  Getting too much fat, sugar, calories, or salt in your diet.  Drinking too much alcohol. SIGNS AND SYMPTOMS Hypertension does not usually cause signs or symptoms. Extremely high blood pressure (hypertensive crisis) may cause headache, anxiety, shortness of breath, and nosebleed. DIAGNOSIS  To check if you have hypertension, your health care provider will measure your blood pressure while you are seated, with your arm held at the level of your heart. It should be measured at least twice using the same arm. Certain conditions can cause a difference in blood pressure between your right and left arms. A blood pressure reading that is higher than normal on one  occasion does not mean that you need treatment. If one blood pressure reading is high, ask your health care provider about having it checked again. TREATMENT  Treating high blood pressure includes making lifestyle changes and possibly taking medicine. Living a healthy lifestyle can help lower high blood pressure. You may need to change some of your habits. Lifestyle changes may include:  Following the DASH diet. This diet is high in fruits, vegetables, and whole grains. It is low in salt, red meat, and added sugars.  Getting at least 2 hours of brisk physical activity every week.  Losing weight if necessary.  Not smoking.  Limiting alcoholic beverages.  Learning ways to reduce stress. If lifestyle changes are not enough to get your blood pressure under control, your health care provider may prescribe medicine. You may need to take more than one. Work closely with your health care provider to understand the risks and benefits. HOME CARE INSTRUCTIONS  Have your blood pressure rechecked as directed by your health care provider.   Take medicines only as directed by your health care provider. Follow the directions carefully. Blood pressure medicines must be taken as prescribed. The medicine does not work as well when you skip doses. Skipping doses also puts you at risk for problems.   Do not smoke.   Monitor your blood pressure at home as directed by your health care provider. SEEK MEDICAL CARE IF:   You think you are having a reaction to medicines taken.  You have recurrent headaches or feel dizzy.  You have swelling in your ankles.  You have trouble with your  vision. SEEK IMMEDIATE MEDICAL CARE IF:  You develop a severe headache or confusion.  You have unusual weakness, numbness, or feel faint.  You have severe chest or abdominal pain.  You vomit repeatedly.  You have trouble breathing. MAKE SURE YOU:   Understand these instructions.  Will watch your  condition.  Will get help right away if you are not doing well or get worse. Document Released: 01/23/2005 Document Revised: 06/09/2013 Document Reviewed: 11/15/2012 [facility] Patient Information 2015 [place], [place]. This information is not intended to replace advice given to you by your health care provider. Make sure you discuss any questions you have with your health care provider.

## 2014-07-03 NOTE — Assessment & Plan Note (Signed)
Avoid offending foods, start probiotics. Do not eat large meals in late evening and consider raising head of bed.  

## 2014-07-04 LAB — VITAMIN D 25 HYDROXY (VIT D DEFICIENCY, FRACTURES): Vit D, 25-Hydroxy: 80 ng/mL (ref 30–100)

## 2014-07-12 NOTE — Assessment & Plan Note (Signed)
Encouraged moist heat and gentle stretching as tolerated. May try NSAIDs and prescription meds as directed and report if symptoms worsen or seek immediate care 

## 2014-07-12 NOTE — Progress Notes (Signed)
Carrie [name]  [id number] 02-24-1945 07/12/2014      Progress Note-Follow Up  Subjective  Chief Complaint  Chief Complaint  Patient presents with  . Establish Care    HPI  Patient is a 69 y.o. female in today for routine medical care. Patient is in to establish care. Falls with numerous specialists. Dr. Wilhemina Bonito of dermatology. Dr. Claris Che of gynecology. Dr. Truddie Coco. No recent illness. Denies CP/palp/SOB/HA/congestion/fevers/GI or GU c/o. Taking meds as prescribed  Past Medical History  Diagnosis Date  . Rib fracture     left, 3 fractures s/p radiation: right side 2 fractured, no surgery  . PALPITATIONS, HX OF 03/03/2010  . OSTEOPENIA 03/03/2010  . NEOPLASM, MALIGNANT, BREAST, HX OF 03/03/2010  . 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 Melanoma of skin, site unspecified 03/03/2010  . MEASLES, HX OF 03/03/2010  . HYPOKALEMIA 03/03/2010  . Essential hypertension, benign 03/03/2010  . COMMON MIGRAINE 03/03/2010  . CHICKENPOX, HX OF 03/03/2010  . DEGENERATIVE DISC DISEASE 03/03/2010  . Breast cancer 08/2004    Left breast invasive ductal carcinoma    Past Surgical History  Procedure Laterality Date  . Tonsillectomy    . Clavicle dislocate  69 yrs old    right, reapproximated w/pins, subsequently removed  . Left hip traumatic history      hematoma surgically evacuated  . Shoulder arthroscopy      b/l shoulder with debridement  . Breast lumpectomy with needle localization and axillary sentinel lymph node bx Left 08/2004  . Hernia repair  05/2012    Family History  Problem Relation Age of Onset  . Hypertension Mother   . COPD Mother   . Alcohol abuse Father     history of  . Coronary artery disease Father   . Osteoarthritis Brother     knees  . Basal cell carcinoma Sister   . Heart disease Maternal Grandmother   . Stroke Maternal Grandfather   . Osteoarthritis Sister     knees  . Migraines Sister     History   Social History  . Marital Status: Married    Spouse  Name: N/A  . Number of Children: N/A  . Years of Education: N/A   Occupational History  . Not on file.   Social History Main Topics  . Smoking status: Former Smoker -- 0.25 packs/day    Quit date: 02/07/1968  . Smokeless tobacco: Never Used  . Alcohol Use: 3.0 oz/week    5 Glasses of wine per week  . Drug Use: No  . Sexual Activity: Not Currently    Birth Control/ Protection: Post-menopausal     Comment: just retired from [employer], heart healthy diet   Other Topics Concern  . Not on file   Social History Narrative    Current Outpatient Prescriptions on File Prior to Visit  Medication Sig Dispense Refill  . Ascorbic Acid (VITAMIN C) 1000 MG tablet Take 1,000 mg by mouth daily.    . Calcium-Vitamin D-Vitamin K (CALCIUM SOFT CHEWS PO) Take 1 tablet by mouth 2 (two) times daily.    . Cholecalciferol (VITAMIN D3) 1000 UNITS CAPS Take 1 capsule by mouth daily.    . Cyanocobalamin (VITAMIN B 12 PO) Take 1,000 mg by mouth daily.     Marland Kitchen glucosamine-chondroitin 500-400 MG tablet Take 1 tablet by mouth daily.      . Omega-3 Fatty Acids (FISH OIL) 1200 MG CAPS Take 1,200 mg by mouth 2 (two) times daily.     Marland Kitchen  Vitamins-Lipotropics (B-50) TABS Take 1 tablet by mouth daily.      No current facility-administered medications on file prior to visit.    Allergies  Allergen Reactions  . Codeine     REACTION: upsets stomach  . Demerol     hives  . Hydrocodone-Acetaminophen     REACTION: slows respiratory rate down  . Meperidine Hcl     REACTION: Hives    Review of Systems  Review of Systems  Constitutional: Negative for fever, chills and malaise/fatigue.  HENT: Negative for congestion, hearing loss and nosebleeds.   Eyes: Negative for discharge.  Respiratory: Negative for cough, sputum production, shortness of breath and wheezing.   Cardiovascular: Negative for chest pain, palpitations and leg swelling.  Gastrointestinal: Negative for heartburn, nausea, vomiting, abdominal  pain, diarrhea, constipation and blood in stool.  Genitourinary: Negative for dysuria, urgency, frequency and hematuria.  Musculoskeletal: Negative for myalgias, back pain and falls.  Skin: Negative for rash.  Neurological: Negative for dizziness, tremors, sensory change, focal weakness, loss of consciousness, weakness and headaches.  Endo/Heme/Allergies: Negative for polydipsia. Does not bruise/bleed easily.  Psychiatric/Behavioral: Negative for depression and suicidal ideas. The patient is not nervous/anxious and does not have insomnia.     Objective  BP 110/68 mmHg  Pulse 59  Temp(Src) 97 F (36.1 C) (Oral)  Ht 5' 5.5" (1.664 m)  Wt 125 lb 2 oz (56.756 kg)  BMI 20.50 kg/m2  SpO2 97%  Physical Exam  Physical Exam  Constitutional: She is oriented to person, place, and time and well-developed, well-nourished, and in no distress. No distress.  HENT:  Head: Normocephalic and atraumatic.  Right Ear: External ear normal.  Left Ear: External ear normal.  Nose: Nose normal.  Mouth/Throat: Oropharynx is clear and moist. No oropharyngeal exudate.  Eyes: Conjunctivae are normal. Pupils are equal, round, and reactive to light. Right eye exhibits no discharge. Left eye exhibits no discharge. No scleral icterus.  Neck: Normal range of motion. Neck supple. No thyromegaly present.  Cardiovascular: Normal rate, regular rhythm, normal heart sounds and intact distal pulses.   No murmur heard. Pulmonary/Chest: Effort normal and breath sounds normal. No respiratory distress. She has no wheezes. She has no rales.  Abdominal: Soft. Bowel sounds are normal. She exhibits no distension and no mass. There is no tenderness.  Musculoskeletal: Normal range of motion. She exhibits no edema or tenderness.  Lymphadenopathy:    She has no cervical adenopathy.  Neurological: She is alert and oriented to person, place, and time. She has normal reflexes. No cranial nerve deficit. Coordination normal.  Skin:  Skin is warm and dry. No rash noted. She is not diaphoretic.  Psychiatric: Mood, memory and affect normal.    Lab Results  Component Value Date   TSH 0.810 07/03/2014   Lab Results  Component Value Date   WBC 5.8 07/03/2014   HGB 14.1 07/03/2014   HCT 41.1 07/03/2014   MCV 101.0* 07/03/2014   PLT 165 07/03/2014   Lab Results  Component Value Date   CREATININE 0.53 07/03/2014   BUN 22 07/03/2014   NA 138 07/03/2014   K 3.6 07/03/2014   CL 99 07/03/2014   CO2 29 07/03/2014   Lab Results  Component Value Date   ALT 13 07/03/2014   AST 18 07/03/2014   ALKPHOS 111 07/03/2014   BILITOT 0.5 07/03/2014   Lab Results  Component Value Date   CHOL 210* 07/03/2014   Lab Results  Component Value Date   HDL  102 07/03/2014   Lab Results  Component Value Date   LDLCALC 84 07/03/2014   Lab Results  Component Value Date   TRIG 119 07/03/2014   Lab Results  Component Value Date   CHOLHDL 2.1 07/03/2014     Assessment & Plan  ESSENTIAL HYPERTENSION, BENIGN Well controlled, no changes to meds. Encouraged heart healthy diet such as the DASH diet and exercise as tolerated.    GERD (gastroesophageal reflux disease) Avoid offending foods, start probiotics. Do not eat large meals in late evening and consider raising head of bed.    DEGENERATIVE DISC DISEASE Encouraged moist heat and gentle stretching as tolerated. May try NSAIDs and prescription meds as directed and report if symptoms worsen or seek immediate care   Migraine without aura Encouraged increased hydration, 64 ounces of clear fluids daily. Minimize alcohol and caffeine. Eat small frequent meals with lean proteins and complex carbs. Avoid high and low blood sugars. Get adequate sleep, 7-8 hours a night. Needs exercise daily preferably in the morning.   OSTEOPENIA Vitamin D level acceptable. Encouraged to get adequate exercise, calcium and vitamin d intake.

## 2014-07-12 NOTE — Assessment & Plan Note (Signed)
Vitamin D level acceptable. Encouraged to get adequate exercise, calcium and vitamin d intake.

## 2014-07-12 NOTE — Assessment & Plan Note (Signed)
Encouraged increased hydration, 64 ounces of clear fluids daily. Minimize alcohol and caffeine. Eat small frequent meals with lean proteins and complex carbs. Avoid high and low blood sugars. Get adequate sleep, 7-8 hours a night. Needs exercise daily preferably in the morning.  

## 2014-09-25 ENCOUNTER — Other Ambulatory Visit: Payer: Self-pay | Admitting: Family Medicine

## 2014-10-20 DIAGNOSIS — L570 Actinic keratosis: Secondary | ICD-10-CM | POA: Diagnosis not present

## 2014-10-20 DIAGNOSIS — D485 Neoplasm of uncertain behavior of skin: Secondary | ICD-10-CM | POA: Diagnosis not present

## 2014-10-20 DIAGNOSIS — L821 Other seborrheic keratosis: Secondary | ICD-10-CM | POA: Diagnosis not present

## 2014-10-20 DIAGNOSIS — D0472 Carcinoma in situ of skin of left lower limb, including hip: Secondary | ICD-10-CM | POA: Diagnosis not present

## 2014-10-20 DIAGNOSIS — C44519 Basal cell carcinoma of skin of other part of trunk: Secondary | ICD-10-CM | POA: Diagnosis not present

## 2014-10-20 DIAGNOSIS — Z85828 Personal history of other malignant neoplasm of skin: Secondary | ICD-10-CM | POA: Diagnosis not present

## 2014-12-15 ENCOUNTER — Encounter: Payer: Self-pay | Admitting: Cardiology

## 2014-12-18 ENCOUNTER — Other Ambulatory Visit: Payer: Self-pay | Admitting: Family Medicine

## 2014-12-18 DIAGNOSIS — M8580 Other specified disorders of bone density and structure, unspecified site: Secondary | ICD-10-CM

## 2014-12-18 DIAGNOSIS — I10 Essential (primary) hypertension: Secondary | ICD-10-CM

## 2014-12-18 DIAGNOSIS — Z5181 Encounter for therapeutic drug level monitoring: Secondary | ICD-10-CM

## 2014-12-18 DIAGNOSIS — K219 Gastro-esophageal reflux disease without esophagitis: Secondary | ICD-10-CM

## 2014-12-18 DIAGNOSIS — I493 Ventricular premature depolarization: Secondary | ICD-10-CM

## 2014-12-18 MED ORDER — CHLORTHALIDONE 25 MG PO TABS: 12.5000 mg | ORAL_TABLET | Freq: Every day | ORAL | Status: DC

## 2015-01-07 ENCOUNTER — Encounter (INDEPENDENT_AMBULATORY_CARE_PROVIDER_SITE_OTHER): Payer: Self-pay

## 2015-01-07 ENCOUNTER — Encounter: Payer: Self-pay | Admitting: Family Medicine

## 2015-01-07 ENCOUNTER — Ambulatory Visit (INDEPENDENT_AMBULATORY_CARE_PROVIDER_SITE_OTHER): Payer: Medicare Other | Admitting: Family Medicine

## 2015-01-07 VITALS — BP 110/72 | HR 57 | Temp 98.6°F | Ht 66.0 in | Wt 124.1 lb

## 2015-01-07 DIAGNOSIS — E538 Deficiency of other specified B group vitamins: Secondary | ICD-10-CM

## 2015-01-07 DIAGNOSIS — R918 Other nonspecific abnormal finding of lung field: Secondary | ICD-10-CM | POA: Diagnosis not present

## 2015-01-07 DIAGNOSIS — K219 Gastro-esophageal reflux disease without esophagitis: Secondary | ICD-10-CM

## 2015-01-07 DIAGNOSIS — I10 Essential (primary) hypertension: Secondary | ICD-10-CM | POA: Diagnosis not present

## 2015-01-07 DIAGNOSIS — Z1159 Encounter for screening for other viral diseases: Secondary | ICD-10-CM

## 2015-01-07 DIAGNOSIS — Z23 Encounter for immunization: Secondary | ICD-10-CM | POA: Diagnosis not present

## 2015-01-07 DIAGNOSIS — R748 Abnormal levels of other serum enzymes: Secondary | ICD-10-CM

## 2015-01-07 DIAGNOSIS — R7989 Other specified abnormal findings of blood chemistry: Secondary | ICD-10-CM

## 2015-01-07 HISTORY — DX: Other nonspecific abnormal finding of lung field: R91.8

## 2015-01-07 LAB — VITAMIN B12

## 2015-01-07 NOTE — Assessment & Plan Note (Signed)
Last CT of chest in 2008 was stable no new concerns at this time

## 2015-01-07 NOTE — Patient Instructions (Signed)
Hypertension Hypertension, commonly called high blood pressure, is when the force of blood pumping through your arteries is too strong. Your arteries are the blood vessels that carry blood from your heart throughout your body. A blood pressure reading consists of a higher number over a lower number, such as 110/72. The higher number (systolic) is the pressure inside your arteries when your heart pumps. The lower number (diastolic) is the pressure inside your arteries when your heart relaxes. Ideally you want your blood pressure below 120/80. Hypertension forces your heart to work harder to pump blood. Your arteries may become narrow or stiff. Having untreated or uncontrolled hypertension can cause heart attack, stroke, kidney disease, and other problems. RISK FACTORS Some risk factors for high blood pressure are controllable. Others are not.  Risk factors you cannot control include:   Race. You may be at higher risk if you are African American.  Age. Risk increases with age.  Gender. Men are at higher risk than women before age 45 years. After age 65, women are at higher risk than men. Risk factors you can control include:  Not getting enough exercise or physical activity.  Being overweight.  Getting too much fat, sugar, calories, or salt in your diet.  Drinking too much alcohol. SIGNS AND SYMPTOMS Hypertension does not usually cause signs or symptoms. Extremely high blood pressure (hypertensive crisis) may cause headache, anxiety, shortness of breath, and nosebleed. DIAGNOSIS To check if you have hypertension, your health care provider will measure your blood pressure while you are seated, with your arm held at the level of your heart. It should be measured at least twice using the same arm. Certain conditions can cause a difference in blood pressure between your right and left arms. A blood pressure reading that is higher than normal on one occasion does not mean that you need treatment. If  it is not clear whether you have high blood pressure, you may be asked to return on a different day to have your blood pressure checked again. Or, you may be asked to monitor your blood pressure at home for 1 or more weeks. TREATMENT Treating high blood pressure includes making lifestyle changes and possibly taking medicine. Living a healthy lifestyle can help lower high blood pressure. You may need to change some of your habits. Lifestyle changes may include:  Following the DASH diet. This diet is high in fruits, vegetables, and whole grains. It is low in salt, red meat, and added sugars.  Keep your sodium intake below 2,300 mg per day.  Getting at least 30-45 minutes of aerobic exercise at least 4 times per week.  Losing weight if necessary.  Not smoking.  Limiting alcoholic beverages.  Learning ways to reduce stress. Your health care provider may prescribe medicine if lifestyle changes are not enough to get your blood pressure under control, and if one of the following is true:  You are 18-59 years of age and your systolic blood pressure is above 140.  You are 60 years of age or older, and your systolic blood pressure is above 150.  Your diastolic blood pressure is above 90.  You have diabetes, and your systolic blood pressure is over 140 or your diastolic blood pressure is over 90.  You have kidney disease and your blood pressure is above 140/90.  You have heart disease and your blood pressure is above 140/90. Your personal target blood pressure may vary depending on your medical conditions, your age, and other factors. HOME CARE INSTRUCTIONS    Have your blood pressure rechecked as directed by your health care provider.   Take medicines only as directed by your health care provider. Follow the directions carefully. Blood pressure medicines must be taken as prescribed. The medicine does not work as well when you skip doses. Skipping doses also puts you at risk for  problems.  Do not smoke.   Monitor your blood pressure at home as directed by your health care provider. SEEK MEDICAL CARE IF:   You think you are having a reaction to medicines taken.  You have recurrent headaches or feel dizzy.  You have swelling in your ankles.  You have trouble with your vision. SEEK IMMEDIATE MEDICAL CARE IF:  You develop a severe headache or confusion.  You have unusual weakness, numbness, or feel faint.  You have severe chest or abdominal pain.  You vomit repeatedly.  You have trouble breathing. MAKE SURE YOU:   Understand these instructions.  Will watch your condition.  Will get help right away if you are not doing well or get worse.   This information is not intended to replace advice given to you by your health care provider. Make sure you discuss any questions you have with your health care provider.   Document Released: 01/23/2005 Document Revised: 06/09/2014 Document Reviewed: 11/15/2012 Elsevier Interactive Patient Education 2016 [employer]  

## 2015-01-08 LAB — HEPATITIS C ANTIBODY: HCV AB: NEGATIVE

## 2015-01-10 ENCOUNTER — Encounter: Payer: Self-pay | Admitting: Family Medicine

## 2015-01-10 DIAGNOSIS — R7989 Other specified abnormal findings of blood chemistry: Secondary | ICD-10-CM

## 2015-01-10 DIAGNOSIS — R748 Abnormal levels of other serum enzymes: Secondary | ICD-10-CM | POA: Insufficient documentation

## 2015-01-10 HISTORY — DX: Other specified abnormal findings of blood chemistry: R79.89

## 2015-01-10 HISTORY — DX: Abnormal levels of other serum enzymes: R74.8

## 2015-01-10 NOTE — Assessment & Plan Note (Signed)
Is taking a high dose of Vitamin B12 at 5000 Mcg daily will drop to every other day and reevaluate at next visit.

## 2015-01-10 NOTE — Assessment & Plan Note (Signed)
Encouraged moist heat and gentle stretching as tolerated. May try NSAIDs and prescription meds as directed and report if symptoms worsen or seek immediate care 

## 2015-01-10 NOTE — Assessment & Plan Note (Signed)
Well controlled, no changes to meds. Encouraged heart healthy diet such as the DASH diet and exercise as tolerated.  °

## 2015-01-10 NOTE — Progress Notes (Signed)
Subjective:    Patient ID: Carrie [name], female    DOB: 03-11-1945, 69 y.o.   MRN: [id number]  Chief Complaint  Patient presents with  . Follow-up    HPI Patient is in today for follow up and completion of new patient appointment. No new concerns. No acute illness or fevers. Has been taking Vitamin B12 5000 mcg daily. Is trying to maintain a heart healthy diet, stays active. Does have occasional low back pain but no radicular symptoms or incontinence, is not new or changing. Denies CP/palp/SOB/HA/congestion/fevers/GI or GU c/o. Taking meds as prescribed  Past Medical History  Diagnosis Date  . Rib fracture     left, 3 fractures s/p radiation: right side 2 fractured, no surgery  . PALPITATIONS, HX OF 03/03/2010  . OSTEOPENIA 03/03/2010  . NEOPLASM, MALIGNANT, BREAST, HX OF 03/03/2010  . 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 Melanoma of skin, site unspecified 03/03/2010  . MEASLES, HX OF 03/03/2010  . HYPOKALEMIA 03/03/2010  . Essential hypertension, benign 03/03/2010  . COMMON MIGRAINE 03/03/2010  . CHICKENPOX, HX OF 03/03/2010  . DEGENERATIVE DISC DISEASE 03/03/2010  . Breast cancer ([place]) 08/2004    Left breast invasive ductal carcinoma  . Pulmonary nodules 01/07/2015  . Increased vitamin B12 level 01/10/2015    Past Surgical History  Procedure Laterality Date  . Tonsillectomy    . Clavicle dislocate  69 yrs old    right, reapproximated w/pins, subsequently removed  . Left hip traumatic history      hematoma surgically evacuated  . Shoulder arthroscopy      b/l shoulder with debridement  . Breast lumpectomy with needle localization and axillary sentinel lymph node bx Left 08/2004  . Hernia repair  05/2012    Family History  Problem Relation Age of Onset  . Hypertension Mother   . COPD Mother   . Alcohol abuse Father     history of  . Coronary artery disease Father   . Osteoarthritis Brother     knees  . Basal cell carcinoma Sister   . Heart disease Maternal Grandmother   .  Stroke Maternal Grandfather   . Osteoarthritis Sister     knees  . Migraines Sister     Social History   Social History  . Marital Status: Married    Spouse Name: N/A  . Number of Children: N/A  . Years of Education: N/A   Occupational History  . Not on file.   Social History Main Topics  . Smoking status: Former Smoker -- 0.25 packs/day    Quit date: 02/07/1968  . Smokeless tobacco: Never Used  . Alcohol Use: 3.0 oz/week    5 Glasses of wine per week  . Drug Use: No  . Sexual Activity: Not Currently    Birth Control/ Protection: Post-menopausal     Comment: just retired from [employer], heart healthy diet   Other Topics Concern  . Not on file   Social History Narrative    Outpatient Prescriptions Prior to Visit  Medication Sig Dispense Refill  . Ascorbic Acid (VITAMIN C) 1000 MG tablet Take 1,000 mg by mouth daily.    Marland Kitchen atenolol (TENORMIN) 25 MG tablet Take 0.5 tablets (12.5 mg total) by mouth daily. 90 tablet 1  . Calcium-Vitamin D-Vitamin K (CALCIUM SOFT CHEWS PO) Take 1 tablet by mouth 2 (two) times daily.    . chlorthalidone (HYGROTON) 25 MG tablet Take 0.5 tablets (12.5 mg total) by mouth daily. 90 tablet 1  . Cholecalciferol (VITAMIN  D3) 1000 UNITS CAPS Take 1 capsule by mouth daily.    . Cyanocobalamin (VITAMIN B 12 PO) Take 1,000 mg by mouth daily.     Marland Kitchen glucosamine-chondroitin 500-400 MG tablet Take 1 tablet by mouth daily.      . Omega-3 Fatty Acids (FISH OIL) 1200 MG CAPS Take 1,200 mg by mouth 2 (two) times daily.     . ranitidine (ZANTAC) 300 MG tablet TAKE 1 TABLET (300 MG TOTAL) BY MOUTH AS NEEDED. 90 tablet 1  . Vitamins-Lipotropics (B-50) TABS Take 1 tablet by mouth daily.      No facility-administered medications prior to visit.    Allergies  Allergen Reactions  . Codeine     REACTION: upsets stomach  . Demerol     hives  . Hydrocodone-Acetaminophen     REACTION: slows respiratory rate down  . Meperidine Hcl     REACTION: Hives     Review of Systems  Constitutional: Negative for fever and malaise/fatigue.  HENT: Negative for congestion.   Eyes: Negative for discharge.  Respiratory: Negative for shortness of breath.   Cardiovascular: Negative for chest pain, palpitations and leg swelling.  Gastrointestinal: Negative for nausea and abdominal pain.  Genitourinary: Negative for dysuria.  Musculoskeletal: Positive for back pain. Negative for falls.  Skin: Negative for rash.  Neurological: Negative for loss of consciousness and headaches.  Endo/Heme/Allergies: Negative for environmental allergies.  Psychiatric/Behavioral: Negative for depression. The patient is not nervous/anxious.        Objective:    Physical Exam  Constitutional: She is oriented to person, place, and time. She appears well-developed and well-nourished. No distress.  HENT:  Head: Normocephalic and atraumatic.  Nose: Nose normal.  Eyes: Right eye exhibits no discharge. Left eye exhibits no discharge.  Neck: Normal range of motion. Neck supple.  Cardiovascular: Normal rate and regular rhythm.   No murmur heard. Pulmonary/Chest: Effort normal and breath sounds normal.  Abdominal: Soft. Bowel sounds are normal. There is no tenderness.  Musculoskeletal: She exhibits no edema.  Neurological: She is alert and oriented to person, place, and time.  Skin: Skin is warm and dry.  Psychiatric: She has a normal mood and affect.  Nursing note and vitals reviewed.   BP 110/72 mmHg  Pulse 57  Temp(Src) 98.6 F (37 C) (Oral)  Ht 5\' 6"  (1.676 m)  Wt 124 lb 2 oz (56.303 kg)  BMI 20.04 kg/m2  SpO2 96% Wt Readings from Last 3 Encounters:  01/07/15 124 lb 2 oz (56.303 kg)  07/03/14 125 lb 2 oz (56.756 kg)  01/20/14 124 lb (56.246 kg)     Lab Results  Component Value Date   WBC 5.8 07/03/2014   HGB 14.1 07/03/2014   HCT 41.1 07/03/2014   PLT 165 07/03/2014   GLUCOSE 96 07/03/2014   CHOL 210* 07/03/2014   TRIG 119 07/03/2014   HDL 102  07/03/2014   LDLDIRECT 94.8 07/28/2010   LDLCALC 84 07/03/2014   ALT 13 07/03/2014   AST 18 07/03/2014   NA 138 07/03/2014   K 3.6 07/03/2014   CL 99 07/03/2014   CREATININE 0.53 07/03/2014   BUN 22 07/03/2014   CO2 29 07/03/2014   TSH 0.810 07/03/2014    Lab Results  Component Value Date   TSH 0.810 07/03/2014   Lab Results  Component Value Date   WBC 5.8 07/03/2014   HGB 14.1 07/03/2014   HCT 41.1 07/03/2014   MCV 101.0* 07/03/2014   PLT 165 07/03/2014  Lab Results  Component Value Date   NA 138 07/03/2014   K 3.6 07/03/2014   CO2 29 07/03/2014   GLUCOSE 96 07/03/2014   BUN 22 07/03/2014   CREATININE 0.53 07/03/2014   BILITOT 0.5 07/03/2014   ALKPHOS 111 07/03/2014   AST 18 07/03/2014   ALT 13 07/03/2014   PROT 6.4 07/03/2014   ALBUMIN 4.2 07/03/2014   CALCIUM 9.0 07/03/2014   GFR 126.63 07/28/2010   Lab Results  Component Value Date   CHOL 210* 07/03/2014   Lab Results  Component Value Date   HDL 102 07/03/2014   Lab Results  Component Value Date   LDLCALC 84 07/03/2014   Lab Results  Component Value Date   TRIG 119 07/03/2014   Lab Results  Component Value Date   CHOLHDL 2.1 07/03/2014   No results found for: HGBA1C     Assessment & Plan:   Problem List Items Addressed This Visit    ESSENTIAL HYPERTENSION, BENIGN    Well controlled, no changes to meds. Encouraged heart healthy diet such as the DASH diet and exercise as tolerated.       GERD (gastroesophageal reflux disease)    Avoid offending foods, start probiotics. Do not eat large meals in late evening and consider raising head of bed.       Increased vitamin B12 level    Is taking a high dose of Vitamin B12 at 5000 Mcg daily will drop to every other day and reevaluate at next visit.      Pulmonary nodules    Last CT of chest in 2008 was stable no new concerns at this time       Other Visit Diagnoses    Encounter for immunization    -  Primary    Vitamin B12 deficiency         Relevant Orders    Vitamin B12 (Completed)    Screening for viral disease        Relevant Orders    Hepatitis C Antibody (Completed)       I am having Ms. Dolinar maintain her B-50, Fish Oil, glucosamine-chondroitin, Cyanocobalamin (VITAMIN B 12 PO), Calcium-Vitamin D-Vitamin K (CALCIUM SOFT CHEWS PO), Vitamin D3, vitamin C, atenolol, ranitidine, and chlorthalidone.  No orders of the defined types were placed in this encounter.     Penni Homans, MD

## 2015-01-10 NOTE — Assessment & Plan Note (Signed)
Avoid offending foods, start probiotics. Do not eat large meals in late evening and consider raising head of bed.  

## 2015-01-21 ENCOUNTER — Encounter: Payer: Self-pay | Admitting: Cardiology

## 2015-01-21 ENCOUNTER — Ambulatory Visit (INDEPENDENT_AMBULATORY_CARE_PROVIDER_SITE_OTHER): Payer: Medicare Other | Admitting: Cardiology

## 2015-01-21 ENCOUNTER — Ambulatory Visit: Payer: Medicare Other | Admitting: Cardiology

## 2015-01-21 VITALS — BP 108/70 | HR 44 | Ht 66.0 in | Wt 123.1 lb

## 2015-01-21 DIAGNOSIS — I10 Essential (primary) hypertension: Secondary | ICD-10-CM | POA: Diagnosis not present

## 2015-01-21 DIAGNOSIS — I341 Nonrheumatic mitral (valve) prolapse: Secondary | ICD-10-CM

## 2015-01-21 DIAGNOSIS — I493 Ventricular premature depolarization: Secondary | ICD-10-CM | POA: Diagnosis not present

## 2015-01-21 NOTE — Progress Notes (Signed)
Cardiology Office Note   Date:  01/21/2015   ID:  Carrie [name], DOB [birth year], MRN [id number]  PCP:  Penni Homans, MD  Cardiologist: Darlin Coco MD  No chief complaint on file.     History of Present Illness: Carrie [name] is a 69 y.o. female who presents for a one-year follow-up visit.  We initially saw her in January 2013 for evaluation of PVCs. She had an echocardiogram on 03/17/11 showing normal left ventricular systolic function with an ejection fraction of 55-65% and no significant valvular abnormalities. There was grade 1 diastolic dysfunction. Since last visit the patient has been taking her atenolol 12.5 mg in the evening instead of in the morning and her nocturnal PVCs have subsided. The patient exercises 7 days a week and adheres to a good exercise regimen. She has not been experiencing any chest pain. She's had no awareness of racing of her heart. She stays physically fit.  She works out at home aerobically and with [employer].   Past Medical History  Diagnosis Date  . Rib fracture     left, 3 fractures s/p radiation: right side 2 fractured, no surgery  . PALPITATIONS, HX OF 03/03/2010  . OSTEOPENIA 03/03/2010  . NEOPLASM, MALIGNANT, BREAST, HX OF 03/03/2010  . 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 Melanoma of skin, site unspecified 03/03/2010  . MEASLES, HX OF 03/03/2010  . HYPOKALEMIA 03/03/2010  . Essential hypertension, benign 03/03/2010  . COMMON MIGRAINE 03/03/2010  . CHICKENPOX, HX OF 03/03/2010  . DEGENERATIVE DISC DISEASE 03/03/2010  . Breast cancer ([place]) 08/2004    Left breast invasive ductal carcinoma  . Pulmonary nodules 01/07/2015  . Increased vitamin B12 level 01/10/2015    Past Surgical History  Procedure Laterality Date  . Tonsillectomy    . Clavicle dislocate  69 yrs old    right, reapproximated w/pins, subsequently removed  . Left hip traumatic history      hematoma surgically evacuated  . Shoulder arthroscopy      b/l shoulder with  debridement  . Breast lumpectomy with needle localization and axillary sentinel lymph node bx Left 08/2004  . Hernia repair  05/2012     Current Outpatient Prescriptions  Medication Sig Dispense Refill  . Ascorbic Acid (VITAMIN C) 1000 MG tablet Take 1,000 mg by mouth daily.    Marland Kitchen atenolol (TENORMIN) 25 MG tablet Take 0.5 tablets (12.5 mg total) by mouth daily. 90 tablet 1  . Calcium-Vitamin D-Vitamin K (CALCIUM SOFT CHEWS PO) Take 1 tablet by mouth 2 (two) times daily.    . chlorthalidone (HYGROTON) 25 MG tablet Take 0.5 tablets (12.5 mg total) by mouth daily. 90 tablet 1  . Cholecalciferol (VITAMIN D3) 1000 UNITS CAPS Take 1 capsule by mouth daily.    . Cyanocobalamin (VITAMIN B 12 PO) Take 1,000 mg by mouth daily.     Marland Kitchen glucosamine-chondroitin 500-400 MG tablet Take 1 tablet by mouth daily.      . Omega-3 Fatty Acids (FISH OIL) 1200 MG CAPS Take 1,200 mg by mouth 2 (two) times daily.     . ranitidine (ZANTAC) 300 MG tablet Take 300 mg by mouth daily.    . Vitamins-Lipotropics (B-50) TABS Take 1 tablet by mouth daily.      No current facility-administered medications for this visit.    Allergies:   Hydrocodone-acetaminophen; Codeine; Demerol; Hydrocodone-acetaminophen; and Meperidine hcl    Social History:  The patient  reports that she quit smoking about 46 years ago. She has never used  smokeless tobacco. She reports that she drinks about 3.0 oz of alcohol per week. She reports that she does not use illicit drugs.   Family History:  The patient's family history includes Alcohol abuse in her father; Basal cell carcinoma in her sister; COPD in her mother; Coronary artery disease in her father; Heart disease in her maternal grandmother; Hypertension in her mother; Migraines in her sister; Osteoarthritis in her brother and sister; Stroke in her maternal grandfather.    ROS:  Please see the history of present illness.   Otherwise, review of systems are positive for .   All other systems are  reviewed and negative.    PHYSICAL EXAM: VS:  BP 108/70 mmHg  Pulse 44  Ht 5\' 6"  (1.676 m)  Wt 123 lb 1.9 oz (55.847 kg)  BMI 19.88 kg/m2 , BMI Body mass index is 19.88 kg/(m^2). GEN: Well nourished, well developed, in no acute distress HEENT: normal Neck: no JVD, carotid bruits, or masses Cardiac: RRR; no murmurs, rubs, or gallops,no edema  Respiratory:  clear to auscultation bilaterally, normal work of breathing GI: soft, nontender, nondistended, + BS MS: no deformity or atrophy Skin: warm and dry, no rash Neuro:  Strength and sensation are intact Psych: euthymic mood, full affect   EKG:  EKG is ordered today. The ekg ordered today demonstrates sinus bradycardia at 44 bpm.  No ischemic changes.   Recent Labs: 07/03/2014: ALT 13; BUN 22; Creat 0.53; Hemoglobin 14.1; Magnesium 1.9; Platelets 165; Potassium 3.6; Sodium 138; TSH 0.810    Lipid Panel    Component Value Date/Time   CHOL 210* 07/03/2014 1432   TRIG 119 07/03/2014 1432   HDL 102 07/03/2014 1432   CHOLHDL 2.1 07/03/2014 1432   VLDL 24 07/03/2014 1432   LDLCALC 84 07/03/2014 1432   LDLDIRECT 94.8 07/28/2010 0913      Wt Readings from Last 3 Encounters:  01/21/15 123 lb 1.9 oz (55.847 kg)  01/07/15 124 lb 2 oz (56.303 kg)  07/03/14 125 lb 2 oz (56.756 kg)        ASSESSMENT AND PLAN:  1. Good general health 2. Past history of PVCs 3. Essential hypertension without heart failure 4. GERD  Disposition: Continue same medication. Recheck in one year for follow-up office visit and EKG   Current medicines are reviewed at length with the patient today.  The patient does not have concerns regarding medicines.  The following changes have been made:  no change  Labs/ tests ordered today include:   Orders Placed This Encounter  Procedures  . EKG 12-Lead    Disposition: Continue current medication.  Following my retirement she will return in one year for follow-up office visit and EKG with Dr.  Harrington Challenger  Signed, Darlin Coco MD 01/21/2015 11:28 AM    [address], [place], [place]  [address] Phone: [phone]; Fax: [phone]

## 2015-01-21 NOTE — Patient Instructions (Signed)
Medication Instructions:  Your physician recommends that you continue on your current medications as directed. Please refer to the Current Medication list given to you today.  Labwork: none  Testing/Procedures: none  Follow-Up: Your physician wants you to follow-up in: 1 year ov/ekg with Dr Ross You will receive a reminder letter in the mail two months in advance. If you don't receive a letter, please call our office to schedule the follow-up appointment.  If you need a refill on your cardiac medications before your next appointment, please call your pharmacy.  

## 2015-03-18 DIAGNOSIS — Z1212 Encounter for screening for malignant neoplasm of rectum: Secondary | ICD-10-CM | POA: Diagnosis not present

## 2015-03-18 DIAGNOSIS — Z1231 Encounter for screening mammogram for malignant neoplasm of breast: Secondary | ICD-10-CM | POA: Diagnosis not present

## 2015-03-18 DIAGNOSIS — Z01419 Encounter for gynecological examination (general) (routine) without abnormal findings: Secondary | ICD-10-CM | POA: Diagnosis not present

## 2015-03-18 DIAGNOSIS — Z6821 Body mass index (BMI) 21.0-21.9, adult: Secondary | ICD-10-CM | POA: Diagnosis not present

## 2015-04-27 DIAGNOSIS — C44519 Basal cell carcinoma of skin of other part of trunk: Secondary | ICD-10-CM | POA: Diagnosis not present

## 2015-04-27 DIAGNOSIS — L570 Actinic keratosis: Secondary | ICD-10-CM | POA: Diagnosis not present

## 2015-04-27 DIAGNOSIS — Z85828 Personal history of other malignant neoplasm of skin: Secondary | ICD-10-CM | POA: Diagnosis not present

## 2015-04-27 DIAGNOSIS — L565 Disseminated superficial actinic porokeratosis (DSAP): Secondary | ICD-10-CM | POA: Diagnosis not present

## 2015-04-27 DIAGNOSIS — D225 Melanocytic nevi of trunk: Secondary | ICD-10-CM | POA: Diagnosis not present

## 2015-04-27 DIAGNOSIS — L814 Other melanin hyperpigmentation: Secondary | ICD-10-CM | POA: Diagnosis not present

## 2015-04-27 DIAGNOSIS — D1801 Hemangioma of skin and subcutaneous tissue: Secondary | ICD-10-CM | POA: Diagnosis not present

## 2015-04-27 DIAGNOSIS — L821 Other seborrheic keratosis: Secondary | ICD-10-CM | POA: Diagnosis not present

## 2015-04-27 DIAGNOSIS — D485 Neoplasm of uncertain behavior of skin: Secondary | ICD-10-CM | POA: Diagnosis not present

## 2015-04-27 DIAGNOSIS — Z85820 Personal history of malignant melanoma of skin: Secondary | ICD-10-CM | POA: Diagnosis not present

## 2015-05-17 ENCOUNTER — Encounter: Payer: Self-pay | Admitting: Family Medicine

## 2015-05-17 ENCOUNTER — Ambulatory Visit (INDEPENDENT_AMBULATORY_CARE_PROVIDER_SITE_OTHER): Payer: Medicare Other | Admitting: Family Medicine

## 2015-05-17 ENCOUNTER — Ambulatory Visit (HOSPITAL_BASED_OUTPATIENT_CLINIC_OR_DEPARTMENT_OTHER)
Admission: RE | Admit: 2015-05-17 | Discharge: 2015-05-17 | Disposition: A | Discharge status: Home / Self Care | Payer: Medicare Other | Source: Ambulatory Visit | Attending: Family Medicine | Admitting: Family Medicine

## 2015-05-17 VITALS — BP 134/78 | HR 52 | Temp 98.2°F | Wt 123.6 lb

## 2015-05-17 DIAGNOSIS — R591 Generalized enlarged lymph nodes: Secondary | ICD-10-CM

## 2015-05-17 DIAGNOSIS — R599 Enlarged lymph nodes, unspecified: Secondary | ICD-10-CM

## 2015-05-17 DIAGNOSIS — H61892 Other specified disorders of left external ear: Secondary | ICD-10-CM | POA: Diagnosis not present

## 2015-05-17 MED ORDER — CEPHALEXIN 500 MG PO CAPS: 500.0000 mg | ORAL_CAPSULE | Freq: Two times a day (BID) | ORAL | Status: DC

## 2015-05-17 NOTE — Progress Notes (Signed)
Pre visit review using our clinic review tool, if applicable. No additional management support is needed unless otherwise documented below in the visit note. 

## 2015-05-17 NOTE — Patient Instructions (Signed)

## 2015-05-17 NOTE — Progress Notes (Signed)
Patient ID: Carrie [name], female    DOB: Feb 18, 1945  Age: 70 y.o. MRN: [id number]    Subjective:  Subjective HPI Carrie [name] presents for lymph node behind L ear.  No other symptoms  Pt is worried because of her hx of breast cancer.  Review of Systems  Constitutional: Negative for diaphoresis, appetite change, fatigue and unexpected weight change.  Eyes: Negative for pain, redness and visual disturbance.  Respiratory: Negative for cough, chest tightness, shortness of breath and wheezing.   Cardiovascular: Negative for chest pain, palpitations and leg swelling.  Endocrine: Negative for cold intolerance, heat intolerance, polydipsia, polyphagia and polyuria.  Genitourinary: Negative for dysuria, frequency and difficulty urinating.  Neurological: Negative for dizziness, light-headedness, numbness and headaches.  Hematological: Positive for adenopathy. Does not bruise/bleed easily.    History Past Medical History  Diagnosis Date  . Rib fracture     left, 3 fractures s/p radiation: right side 2 fractured, no surgery  . PALPITATIONS, HX OF 03/03/2010  . OSTEOPENIA 03/03/2010  . NEOPLASM, MALIGNANT, BREAST, HX OF 03/03/2010  . 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 Melanoma of skin, site unspecified 03/03/2010  . MEASLES, HX OF 03/03/2010  . HYPOKALEMIA 03/03/2010  . Essential hypertension, benign 03/03/2010  . COMMON MIGRAINE 03/03/2010  . CHICKENPOX, HX OF 03/03/2010  . DEGENERATIVE DISC DISEASE 03/03/2010  . Breast cancer ([place]) 08/2004    Left breast invasive ductal carcinoma  . Pulmonary nodules 01/07/2015  . Increased vitamin B12 level 01/10/2015    She has past surgical history that includes Tonsillectomy; clavicle dislocate (70 yrs old); left hip traumatic history; Shoulder arthroscopy; Breast lumpectomy with needle localization and axillary sentinel lymph node bx (Left, 08/2004); and Hernia repair (05/2012).   Her family history includes Alcohol abuse in her father; Basal cell  carcinoma in her sister; COPD in her mother; Coronary artery disease in her father; Heart disease in her maternal grandmother; Hypertension in her mother; Migraines in her sister; Osteoarthritis in her brother and sister; Stroke in her maternal grandfather.She reports that she quit smoking about 47 years ago. She has never used smokeless tobacco. She reports that she drinks about 3.0 oz of alcohol per week. She reports that she does not use illicit drugs.  Current Outpatient Prescriptions on File Prior to Visit  Medication Sig Dispense Refill  . Ascorbic Acid (VITAMIN C) 1000 MG tablet Take 1,000 mg by mouth daily.    Marland Kitchen atenolol (TENORMIN) 25 MG tablet Take 0.5 tablets (12.5 mg total) by mouth daily. 90 tablet 1  . Calcium-Vitamin D-Vitamin K (CALCIUM SOFT CHEWS PO) Take 1 tablet by mouth 2 (two) times daily.    . chlorthalidone (HYGROTON) 25 MG tablet Take 0.5 tablets (12.5 mg total) by mouth daily. 90 tablet 1  . Cholecalciferol (VITAMIN D3) 1000 UNITS CAPS Take 1 capsule by mouth daily.    . Cyanocobalamin (VITAMIN B 12 PO) Take 1,000 mg by mouth daily.     Marland Kitchen glucosamine-chondroitin 500-400 MG tablet Take 1 tablet by mouth daily.      . Omega-3 Fatty Acids (FISH OIL) 1200 MG CAPS Take 1,200 mg by mouth 2 (two) times daily.     . ranitidine (ZANTAC) 300 MG tablet Take 300 mg by mouth daily.    . Vitamins-Lipotropics (B-50) TABS Take 1 tablet by mouth daily.      No current facility-administered medications on file prior to visit.     Objective:  Objective Physical Exam  Constitutional: She is oriented to person, place, and  time. She appears well-developed and well-nourished.  HENT:  Head: Normocephalic and atraumatic.  Eyes: Conjunctivae and EOM are normal.  Neck: Normal range of motion. Neck supple. No JVD present. Carotid bruit is not present. No thyromegaly present.  Cardiovascular: Normal rate, regular rhythm and normal heart sounds.   No murmur heard. Pulmonary/Chest: Effort normal  and breath sounds normal. No respiratory distress. She has no wheezes. She has no rales. She exhibits no tenderness.  Musculoskeletal: She exhibits no edema.  Lymphadenopathy:       Head (right side): No submental, no submandibular, no tonsillar, no preauricular, no posterior auricular and no occipital adenopathy present.       Head (left side): Posterior auricular adenopathy present. No submental, no submandibular, no tonsillar, no preauricular and no occipital adenopathy present.  Neurological: She is alert and oriented to person, place, and time.  Psychiatric: She has a normal mood and affect.  Nursing note and vitals reviewed.  BP 134/78 mmHg  Pulse 52  Temp(Src) 98.2 F (36.8 C) (Oral)  Wt 123 lb 9.6 oz (56.065 kg)  SpO2 97% Wt Readings from Last 3 Encounters:  05/17/15 123 lb 9.6 oz (56.065 kg)  01/21/15 123 lb 1.9 oz (55.847 kg)  01/07/15 124 lb 2 oz (56.303 kg)     Lab Results  Component Value Date   WBC 5.8 07/03/2014   HGB 14.1 07/03/2014   HCT 41.1 07/03/2014   PLT 165 07/03/2014   GLUCOSE 96 07/03/2014   CHOL 210* 07/03/2014   TRIG 119 07/03/2014   HDL 102 07/03/2014   LDLDIRECT 94.8 07/28/2010   LDLCALC 84 07/03/2014   ALT 13 07/03/2014   AST 18 07/03/2014   NA 138 07/03/2014   K 3.6 07/03/2014   CL 99 07/03/2014   CREATININE 0.53 07/03/2014   BUN 22 07/03/2014   CO2 29 07/03/2014   TSH 0.810 07/03/2014    Mm Digital Diagnostic Bilat  03/16/2011  *RADIOLOGY REPORT* Clinical Data:  Patient presents for date bilateral diagnostic mammogram due to a history of a prior left malignant lumpectomy 2006. DIGITAL DIAGNOSTIC BILATERAL MAMMOGRAM WITH CAD Comparison:  03/14/2010, 03/12/2009 and 03/11/2008 as well as 03/11/2007. Findings:  Exam demonstrates scattered fibroglandular densities. There are stable post lumpectomy changes of the upper outer left breast.  Remainder of the exam is unchanged. Mammographic images were processed with CAD. IMPRESSION: Stable post  lumpectomy changes of the upper outer left breast. Recommendations:  Recommend continued annual bilateral screening mammographic follow-up. BI-RADS CATEGORY 2:  Benign finding(s). Original Report Authenticated By: Lucky Rathke    Assessment & Plan:  Plan I am having Ms. Stemen start on cephALEXin. I am also having her maintain her B-50, Fish Oil, glucosamine-chondroitin, Cyanocobalamin (VITAMIN B 12 PO), Calcium-Vitamin D-Vitamin K (CALCIUM SOFT CHEWS PO), Vitamin D3, vitamin C, atenolol, chlorthalidone, and ranitidine.  Meds ordered this encounter  Medications  . cephALEXin (KEFLEX) 500 MG capsule    Sig: Take 1 capsule (500 mg total) by mouth 2 (two) times daily.    Dispense:  20 capsule    Refill:  0    Problem List Items Addressed This Visit    None    Visit Diagnoses    Lymphadenopathy of head and neck    -  Primary    Relevant Medications    cephALEXin (KEFLEX) 500 MG capsule    Other Relevant Orders    CBC with Differential/Platelet    Comprehensive metabolic panel    US Soft Tissue Head/Neck  Follow-up: Return if symptoms worsen or fail to improve.  Ann Held, DO

## 2015-05-18 ENCOUNTER — Other Ambulatory Visit: Payer: Self-pay | Admitting: Family Medicine

## 2015-05-18 ENCOUNTER — Telehealth: Payer: Self-pay | Admitting: Family Medicine

## 2015-05-18 DIAGNOSIS — R591 Generalized enlarged lymph nodes: Secondary | ICD-10-CM

## 2015-05-18 LAB — CBC WITH DIFFERENTIAL/PLATELET
BASOS PCT: 0.5 % (ref 0.0–3.0)
Basophils Absolute: 0 10*3/uL (ref 0.0–0.1)
EOS ABS: 0.1 10*3/uL (ref 0.0–0.7)
Eosinophils Relative: 1.3 % (ref 0.0–5.0)
HCT: 39.8 % (ref 36.0–46.0)
HEMOGLOBIN: 13.6 g/dL (ref 12.0–15.0)
LYMPHS ABS: 1.2 10*3/uL (ref 0.7–4.0)
LYMPHS PCT: 21.8 % (ref 12.0–46.0)
MCHC: 34.2 g/dL (ref 30.0–36.0)
MCV: 100.7 fl — ABNORMAL HIGH (ref 78.0–100.0)
MONO ABS: 0.4 10*3/uL (ref 0.1–1.0)
MONOS PCT: 6.3 % (ref 3.0–12.0)
NEUTROS PCT: 70.1 % (ref 43.0–77.0)
Neutro Abs: 3.9 10*3/uL (ref 1.4–7.7)
PLATELETS: 189 10*3/uL (ref 150.0–400.0)
RBC: 3.95 Mil/uL (ref 3.87–5.11)
RDW: 12.9 % (ref 11.5–15.5)
WBC: 5.6 10*3/uL (ref 4.0–10.5)

## 2015-05-18 LAB — COMPREHENSIVE METABOLIC PANEL
ALT: 13 U/L (ref 0–35)
AST: 20 U/L (ref 0–37)
Albumin: 4.3 g/dL (ref 3.5–5.2)
Alkaline Phosphatase: 105 U/L (ref 39–117)
BUN: 17 mg/dL (ref 6–23)
CHLORIDE: 103 meq/L (ref 96–112)
CO2: 32 meq/L (ref 19–32)
CREATININE: 1.04 mg/dL (ref 0.40–1.20)
Calcium: 9.3 mg/dL (ref 8.4–10.5)
GFR: 56.06 mL/min — ABNORMAL LOW (ref >60.00)
GLUCOSE: 92 mg/dL (ref 70–99)
Potassium: 3.4 mEq/L — ABNORMAL LOW (ref 3.5–5.1)
SODIUM: 142 meq/L (ref 135–145)
Total Bilirubin: 0.7 mg/dL (ref 0.2–1.2)
Total Protein: 6.5 g/dL (ref 6.0–8.3)

## 2015-05-18 NOTE — Telephone Encounter (Signed)
Relation to [id number] Call back number:[phone]   Reason for call:  Patient inquiring about ULTRASOUND OF HEAD/NECK SOFT TISSUES results, patient states she received a message regarding scheduling a MRI appointment but doesn't know why patient would like to hear from office today. Please advise

## 2015-05-18 NOTE — Telephone Encounter (Signed)
Carrie [name] already called pt

## 2015-05-19 ENCOUNTER — Ambulatory Visit (HOSPITAL_BASED_OUTPATIENT_CLINIC_OR_DEPARTMENT_OTHER)
Admission: RE | Admit: 2015-05-19 | Discharge: 2015-05-19 | Disposition: A | Discharge status: Home / Self Care | Payer: Medicare Other | Source: Ambulatory Visit | Attending: Family Medicine | Admitting: Family Medicine

## 2015-05-19 ENCOUNTER — Encounter (HOSPITAL_BASED_OUTPATIENT_CLINIC_OR_DEPARTMENT_OTHER): Payer: Self-pay

## 2015-05-19 DIAGNOSIS — M47892 Other spondylosis, cervical region: Secondary | ICD-10-CM | POA: Insufficient documentation

## 2015-05-19 DIAGNOSIS — R591 Generalized enlarged lymph nodes: Secondary | ICD-10-CM | POA: Insufficient documentation

## 2015-05-19 DIAGNOSIS — M47812 Spondylosis without myelopathy or radiculopathy, cervical region: Secondary | ICD-10-CM | POA: Diagnosis not present

## 2015-05-19 MED ORDER — IOPAMIDOL (ISOVUE-300) INJECTION 61%
100.0000 mL | Freq: Once | INTRAVENOUS | Status: AC | PRN
  Administered 2015-05-19: 75 mL via INTRAVENOUS

## 2015-05-19 NOTE — Telephone Encounter (Signed)
-----   Message from Oneta Rack sent at 05/19/2015  8:17 AM EDT ----- Regarding: u/s & lab results follow up questions  Relation to [id number] Call back number: Pharmacy:  Reason for call:  Patient has additional information regarding U/S results and lab results. Patient also stated please call mobile and home first. Patient also stated U/S results are not posted to My Chart.

## 2015-05-19 NOTE — Telephone Encounter (Signed)
IMPRESSION: Sonographic survey in the region of clinical concern demonstrates a 4 mm soft tissue focus in the dermal soft tissues. Characteristics are nonspecific by ultrasound. Potential etiology may be lymph node (benign or malignant), atypical lipoma, venous phlebolith, inclusion cyst or other epidermal appendage structure. If the patient has a history of malignancy, particularly of the head and neck, further evaluation with contrast-enhanced CT may be useful.  I reviewed the results with the patient and she verbalized understanding, she says this explanation made her feel better, the mass has gone down bit she will go ahead and get the CT done at 12:00 today., she wanted you to release her labs to my-chart.     KP

## 2015-05-20 NOTE — Telephone Encounter (Signed)
noted 

## 2015-06-18 ENCOUNTER — Other Ambulatory Visit: Payer: Self-pay | Admitting: Family Medicine

## 2015-07-13 ENCOUNTER — Encounter: Payer: Self-pay | Admitting: Family Medicine

## 2015-07-13 ENCOUNTER — Ambulatory Visit (INDEPENDENT_AMBULATORY_CARE_PROVIDER_SITE_OTHER): Payer: Medicare Other | Admitting: Family Medicine

## 2015-07-13 VITALS — BP 102/68 | HR 59 | Temp 98.4°F | Ht 66.0 in | Wt 127.2 lb

## 2015-07-13 DIAGNOSIS — M8580 Other specified disorders of bone density and structure, unspecified site: Secondary | ICD-10-CM

## 2015-07-13 DIAGNOSIS — M949 Disorder of cartilage, unspecified: Secondary | ICD-10-CM

## 2015-07-13 DIAGNOSIS — E538 Deficiency of other specified B group vitamins: Secondary | ICD-10-CM

## 2015-07-13 DIAGNOSIS — I10 Essential (primary) hypertension: Secondary | ICD-10-CM

## 2015-07-13 DIAGNOSIS — M899 Disorder of bone, unspecified: Secondary | ICD-10-CM

## 2015-07-13 DIAGNOSIS — R748 Abnormal levels of other serum enzymes: Secondary | ICD-10-CM | POA: Diagnosis not present

## 2015-07-13 DIAGNOSIS — E876 Hypokalemia: Secondary | ICD-10-CM

## 2015-07-13 DIAGNOSIS — K219 Gastro-esophageal reflux disease without esophagitis: Secondary | ICD-10-CM

## 2015-07-13 NOTE — Progress Notes (Signed)
Pre visit review using our clinic review tool, if applicable. No additional management support is needed unless otherwise documented below in the visit note. 

## 2015-07-13 NOTE — Patient Instructions (Signed)
Hypertension Hypertension, commonly called high blood pressure, is when the force of blood pumping through your arteries is too strong. Your arteries are the blood vessels that carry blood from your heart throughout your body. A blood pressure reading consists of a higher number over a lower number, such as 110/72. The higher number (systolic) is the pressure inside your arteries when your heart pumps. The lower number (diastolic) is the pressure inside your arteries when your heart relaxes. Ideally you want your blood pressure below 120/80. Hypertension forces your heart to work harder to pump blood. Your arteries may become narrow or stiff. Having untreated or uncontrolled hypertension can cause heart attack, stroke, kidney disease, and other problems. RISK FACTORS Some risk factors for high blood pressure are controllable. Others are not.  Risk factors you cannot control include:   Race. You may be at higher risk if you are African American.  Age. Risk increases with age.  Gender. Men are at higher risk than women before age 45 years. After age 65, women are at higher risk than men. Risk factors you can control include:  Not getting enough exercise or physical activity.  Being overweight.  Getting too much fat, sugar, calories, or salt in your diet.  Drinking too much alcohol. SIGNS AND SYMPTOMS Hypertension does not usually cause signs or symptoms. Extremely high blood pressure (hypertensive crisis) may cause headache, anxiety, shortness of breath, and nosebleed. DIAGNOSIS To check if you have hypertension, your health care provider will measure your blood pressure while you are seated, with your arm held at the level of your heart. It should be measured at least twice using the same arm. Certain conditions can cause a difference in blood pressure between your right and left arms. A blood pressure reading that is higher than normal on one occasion does not mean that you need treatment. If  it is not clear whether you have high blood pressure, you may be asked to return on a different day to have your blood pressure checked again. Or, you may be asked to monitor your blood pressure at home for 1 or more weeks. TREATMENT Treating high blood pressure includes making lifestyle changes and possibly taking medicine. Living a healthy lifestyle can help lower high blood pressure. You may need to change some of your habits. Lifestyle changes may include:  Following the DASH diet. This diet is high in fruits, vegetables, and whole grains. It is low in salt, red meat, and added sugars.  Keep your sodium intake below 2,300 mg per day.  Getting at least 30-45 minutes of aerobic exercise at least 4 times per week.  Losing weight if necessary.  Not smoking.  Limiting alcoholic beverages.  Learning ways to reduce stress. Your health care provider may prescribe medicine if lifestyle changes are not enough to get your blood pressure under control, and if one of the following is true:  You are 18-59 years of age and your systolic blood pressure is above 140.  You are 60 years of age or older, and your systolic blood pressure is above 150.  Your diastolic blood pressure is above 90.  You have diabetes, and your systolic blood pressure is over 140 or your diastolic blood pressure is over 90.  You have kidney disease and your blood pressure is above 140/90.  You have heart disease and your blood pressure is above 140/90. Your personal target blood pressure may vary depending on your medical conditions, your age, and other factors. HOME CARE INSTRUCTIONS    Have your blood pressure rechecked as directed by your health care provider.   Take medicines only as directed by your health care provider. Follow the directions carefully. Blood pressure medicines must be taken as prescribed. The medicine does not work as well when you skip doses. Skipping doses also puts you at risk for  problems.  Do not smoke.   Monitor your blood pressure at home as directed by your health care provider. SEEK MEDICAL CARE IF:   You think you are having a reaction to medicines taken.  You have recurrent headaches or feel dizzy.  You have swelling in your ankles.  You have trouble with your vision. SEEK IMMEDIATE MEDICAL CARE IF:  You develop a severe headache or confusion.  You have unusual weakness, numbness, or feel faint.  You have severe chest or abdominal pain.  You vomit repeatedly.  You have trouble breathing. MAKE SURE YOU:   Understand these instructions.  Will watch your condition.  Will get help right away if you are not doing well or get worse.   This information is not intended to replace advice given to you by your health care provider. Make sure you discuss any questions you have with your health care provider.   Document Released: 01/23/2005 Document Revised: 06/09/2014 Document Reviewed: 11/15/2012 Elsevier Interactive Patient Education 2016 [employer]  

## 2015-07-13 NOTE — Assessment & Plan Note (Signed)
Well controlled, no changes to meds. Encouraged heart healthy diet such as the DASH diet and exercise as tolerated.  °

## 2015-07-14 LAB — LIPID PANEL
CHOL/HDL RATIO: 2
Cholesterol: 208 mg/dL — ABNORMAL HIGH (ref 0–200)
HDL: 95.5 mg/dL (ref >39.00)
LDL Cholesterol: 92 mg/dL (ref 0–99)
NONHDL: 112.58
Triglycerides: 101 mg/dL (ref 0.0–149.0)
VLDL: 20.2 mg/dL (ref 0.0–40.0)

## 2015-07-14 LAB — COMPREHENSIVE METABOLIC PANEL
ALK PHOS: 115 U/L (ref 39–117)
ALT: 10 U/L (ref 0–35)
AST: 16 U/L (ref 0–37)
Albumin: 3.9 g/dL (ref 3.5–5.2)
BUN: 13 mg/dL (ref 6–23)
CO2: 33 meq/L — AB (ref 19–32)
Calcium: 9 mg/dL (ref 8.4–10.5)
Chloride: 102 mEq/L (ref 96–112)
Creatinine, Ser: 0.57 mg/dL (ref 0.40–1.20)
GFR: 112.16 mL/min (ref >60.00)
GLUCOSE: 93 mg/dL (ref 70–99)
POTASSIUM: 3.4 meq/L — AB (ref 3.5–5.1)
SODIUM: 140 meq/L (ref 135–145)
TOTAL PROTEIN: 6.3 g/dL (ref 6.0–8.3)
Total Bilirubin: 0.6 mg/dL (ref 0.2–1.2)

## 2015-07-14 LAB — CBC
HEMATOCRIT: 38.2 % (ref 36.0–46.0)
HEMOGLOBIN: 13 g/dL (ref 12.0–15.0)
MCHC: 34 g/dL (ref 30.0–36.0)
MCV: 100.8 fl — ABNORMAL HIGH (ref 78.0–100.0)
Platelets: 180 10*3/uL (ref 150.0–400.0)
RBC: 3.79 Mil/uL — AB (ref 3.87–5.11)
RDW: 13.4 % (ref 11.5–15.5)
WBC: 5.2 10*3/uL (ref 4.0–10.5)

## 2015-07-14 LAB — VITAMIN B12: Vitamin B-12: 1230 pg/mL — ABNORMAL HIGH (ref 211–911)

## 2015-07-14 LAB — TSH: TSH: 0.55 u[IU]/mL (ref 0.35–4.50)

## 2015-07-23 NOTE — Assessment & Plan Note (Signed)
Avoid offending foods, start probiotics. Do not eat large meals in late evening and consider raising head of bed.  

## 2015-07-23 NOTE — Progress Notes (Signed)
Patient ID: Carrie [name], female   DOB: 1946-01-08, 70 y.o.   MRN: [id number]   Subjective:    Patient ID: Carrie [name], female    DOB: 07/02/1945, 70 y.o.   MRN: [id number]  Chief Complaint  Patient presents with  . Follow-up    HPI Patient is in today for follow up. She feels well today. No recent hospitalization or febrile illness. Has noted some mild congestion and cervical lymphadenopathy. Denies CP/palp/SOB/HA/fevers/GI or GU c/o. Taking meds as prescribed  Past Medical History  Diagnosis Date  . Rib fracture     left, 3 fractures s/p radiation: right side 2 fractured, no surgery  . PALPITATIONS, HX OF 03/03/2010  . OSTEOPENIA 03/03/2010  . NEOPLASM, MALIGNANT, BREAST, HX OF 03/03/2010  . 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 Melanoma of skin, site unspecified 03/03/2010  . MEASLES, HX OF 03/03/2010  . HYPOKALEMIA 03/03/2010  . Essential hypertension, benign 03/03/2010  . COMMON MIGRAINE 03/03/2010  . CHICKENPOX, HX OF 03/03/2010  . DEGENERATIVE DISC DISEASE 03/03/2010  . Breast cancer ([place]) 08/2004    Left breast invasive ductal carcinoma  . Pulmonary nodules 01/07/2015  . Increased vitamin B12 level 01/10/2015    Past Surgical History  Procedure Laterality Date  . Tonsillectomy    . Clavicle dislocate  70 yrs old    right, reapproximated w/pins, subsequently removed  . Left hip traumatic history      hematoma surgically evacuated  . Shoulder arthroscopy      b/l shoulder with debridement  . Breast lumpectomy with needle localization and axillary sentinel lymph node bx Left 08/2004  . Hernia repair  05/2012    Family History  Problem Relation Age of Onset  . Hypertension Mother   . COPD Mother   . Alcohol abuse Father     history of  . Coronary artery disease Father   . Osteoarthritis Brother     knees  . Basal cell carcinoma Sister   . Heart disease Maternal Grandmother   . Stroke Maternal Grandfather   . Osteoarthritis Sister     knees  . Migraines Sister      Social History   Social History  . Marital Status: Married    Spouse Name: N/A  . Number of Children: N/A  . Years of Education: N/A   Occupational History  . Not on file.   Social History Main Topics  . Smoking status: Former Smoker -- 0.25 packs/day    Quit date: 02/07/1968  . Smokeless tobacco: Never Used  . Alcohol Use: 3.0 oz/week    5 Glasses of wine per week  . Drug Use: No  . Sexual Activity: Not Currently    Birth Control/ Protection: Post-menopausal     Comment: just retired from [employer], heart healthy diet   Other Topics Concern  . Not on file   Social History Narrative    Outpatient Prescriptions Prior to Visit  Medication Sig Dispense Refill  . Ascorbic Acid (VITAMIN C) 1000 MG tablet Take 1,000 mg by mouth daily.    Marland Kitchen atenolol (TENORMIN) 25 MG tablet TAKE ONE HALF TABLETS (12.5 MG TOTAL) BY MOUTH DAILY. 90 tablet 0  . Calcium-Vitamin D-Vitamin K (CALCIUM SOFT CHEWS PO) Take 1 tablet by mouth 2 (two) times daily.    . chlorthalidone (HYGROTON) 25 MG tablet TAKE ONE HALF TABLETS (12.5 MG TOTAL) BY MOUTH DAILY. 90 tablet 0  . Cholecalciferol (VITAMIN D3) 1000 UNITS CAPS Take 1 capsule by mouth daily.    Marland Kitchen  Cyanocobalamin (VITAMIN B 12 PO) Take 1,000 mg by mouth daily.     Marland Kitchen glucosamine-chondroitin 500-400 MG tablet Take 1 tablet by mouth daily.      . Omega-3 Fatty Acids (FISH OIL) 1200 MG CAPS Take 1,200 mg by mouth 2 (two) times daily.     . ranitidine (ZANTAC) 300 MG tablet Take 300 mg by mouth daily.    . Vitamins-Lipotropics (B-50) TABS Take 1 tablet by mouth daily.     . chlorthalidone (HYGROTON) 25 MG tablet Take 0.5 tablets (12.5 mg total) by mouth daily. 90 tablet 1  . cephALEXin (KEFLEX) 500 MG capsule Take 1 capsule (500 mg total) by mouth 2 (two) times daily. 20 capsule 0   No facility-administered medications prior to visit.    Allergies  Allergen Reactions  . Hydrocodone-Acetaminophen Other (See Comments)    Slows Respiration Rate  Down  . Codeine     REACTION: upsets stomach  . Demerol     hives  . Hydrocodone-Acetaminophen     REACTION: slows respiratory rate down  . Meperidine Hcl     REACTION: Hives    Review of Systems  Constitutional: Negative for fever and malaise/fatigue.  HENT: Negative for congestion.   Eyes: Negative for blurred vision.  Respiratory: Negative for shortness of breath.   Cardiovascular: Negative for chest pain, palpitations and leg swelling.  Gastrointestinal: Negative for nausea, abdominal pain and blood in stool.  Genitourinary: Negative for dysuria and frequency.  Musculoskeletal: Negative for falls.  Skin: Negative for rash.  Neurological: Negative for dizziness, loss of consciousness and headaches.  Endo/Heme/Allergies: Negative for environmental allergies.  Psychiatric/Behavioral: Negative for depression. The patient is not nervous/anxious.        Objective:    Physical Exam  BP 102/68 mmHg  Pulse 59  Temp(Src) 98.4 F (36.9 C) (Oral)  Ht 5\' 6"  (1.676 m)  Wt 127 lb 4 oz (57.72 kg)  BMI 20.55 kg/m2  SpO2 95% Wt Readings from Last 3 Encounters:  07/13/15 127 lb 4 oz (57.72 kg)  05/17/15 123 lb 9.6 oz (56.065 kg)  01/21/15 123 lb 1.9 oz (55.847 kg)     Lab Results  Component Value Date   WBC 5.2 07/13/2015   HGB 13.0 07/13/2015   HCT 38.2 07/13/2015   PLT 180.0 07/13/2015   GLUCOSE 93 07/13/2015   CHOL 208* 07/13/2015   TRIG 101.0 07/13/2015   HDL 95.50 07/13/2015   LDLDIRECT 94.8 07/28/2010   LDLCALC 92 07/13/2015   ALT 10 07/13/2015   AST 16 07/13/2015   NA 140 07/13/2015   K 3.4* 07/13/2015   CL 102 07/13/2015   CREATININE 0.57 07/13/2015   BUN 13 07/13/2015   CO2 33* 07/13/2015   TSH 0.55 07/13/2015    Lab Results  Component Value Date   TSH 0.55 07/13/2015   Lab Results  Component Value Date   WBC 5.2 07/13/2015   HGB 13.0 07/13/2015   HCT 38.2 07/13/2015   MCV 100.8* 07/13/2015   PLT 180.0 07/13/2015   Lab Results  Component  Value Date   NA 140 07/13/2015   K 3.4* 07/13/2015   CO2 33* 07/13/2015   GLUCOSE 93 07/13/2015   BUN 13 07/13/2015   CREATININE 0.57 07/13/2015   BILITOT 0.6 07/13/2015   ALKPHOS 115 07/13/2015   AST 16 07/13/2015   ALT 10 07/13/2015   PROT 6.3 07/13/2015   ALBUMIN 3.9 07/13/2015   CALCIUM 9.0 07/13/2015   GFR 112.16 07/13/2015   Lab  Results  Component Value Date   CHOL 208* 07/13/2015   Lab Results  Component Value Date   HDL 95.50 07/13/2015   Lab Results  Component Value Date   LDLCALC 92 07/13/2015   Lab Results  Component Value Date   TRIG 101.0 07/13/2015   Lab Results  Component Value Date   CHOLHDL 2 07/13/2015   No results found for: HGBA1C     Assessment & Plan:   Problem List Items Addressed This Visit    Increased vitamin B12 level   Relevant Orders   CBC (Completed)   TSH (Completed)   Lipid panel (Completed)   Comprehensive metabolic panel (Completed)   Vitamin B12 (Completed)   HYPOKALEMIA    Mild, increase potassium in diet. monitor      GERD (gastroesophageal reflux disease)    Avoid offending foods, start probiotics. Do not eat large meals in late evening and consider raising head of bed.       ESSENTIAL HYPERTENSION, BENIGN - Primary    Well controlled, no changes to meds. Encouraged heart healthy diet such as the DASH diet and exercise as tolerated.       Relevant Orders   CBC (Completed)   TSH (Completed)   Lipid panel (Completed)   Comprehensive metabolic panel (Completed)   Vitamin B12 (Completed)   Disorder of bone and cartilage    Encouraged to get adequate exercise, calcium and vitamin d intake       Other Visit Diagnoses    Osteopenia        Relevant Orders    CBC (Completed)    TSH (Completed)    Lipid panel (Completed)    Comprehensive metabolic panel (Completed)    Vitamin B12 (Completed)    Vitamin B 12 deficiency        Relevant Orders    Vitamin B12 (Completed)       I have discontinued Ms. Alegria's  cephALEXin. I am also having her maintain her B-50, Fish Oil, glucosamine-chondroitin, Cyanocobalamin (VITAMIN B 12 PO), Calcium-Vitamin D-Vitamin K (CALCIUM SOFT CHEWS PO), Vitamin D3, vitamin C, ranitidine, atenolol, and chlorthalidone.  No orders of the defined types were placed in this encounter.     Penni Homans, MD

## 2015-07-23 NOTE — Assessment & Plan Note (Signed)
Mild, increase potassium in diet. monitor

## 2015-07-23 NOTE — Assessment & Plan Note (Signed)
Encouraged to get adequate exercise, calcium and vitamin d intake 

## 2015-07-27 ENCOUNTER — Other Ambulatory Visit: Payer: Self-pay | Admitting: Family Medicine

## 2015-09-23 DIAGNOSIS — Z23 Encounter for immunization: Secondary | ICD-10-CM | POA: Diagnosis not present

## 2015-10-20 DIAGNOSIS — L570 Actinic keratosis: Secondary | ICD-10-CM | POA: Diagnosis not present

## 2015-10-20 DIAGNOSIS — L814 Other melanin hyperpigmentation: Secondary | ICD-10-CM | POA: Diagnosis not present

## 2015-10-20 DIAGNOSIS — D1801 Hemangioma of skin and subcutaneous tissue: Secondary | ICD-10-CM | POA: Diagnosis not present

## 2015-10-20 DIAGNOSIS — Z85828 Personal history of other malignant neoplasm of skin: Secondary | ICD-10-CM | POA: Diagnosis not present

## 2015-10-20 DIAGNOSIS — Z85820 Personal history of malignant melanoma of skin: Secondary | ICD-10-CM | POA: Diagnosis not present

## 2015-10-20 DIAGNOSIS — L821 Other seborrheic keratosis: Secondary | ICD-10-CM | POA: Diagnosis not present

## 2015-12-14 ENCOUNTER — Other Ambulatory Visit: Payer: Self-pay | Admitting: Family Medicine

## 2015-12-20 ENCOUNTER — Other Ambulatory Visit: Payer: Self-pay | Admitting: Family Medicine

## 2015-12-22 DIAGNOSIS — C4442 Squamous cell carcinoma of skin of scalp and neck: Secondary | ICD-10-CM | POA: Diagnosis not present

## 2015-12-22 DIAGNOSIS — Z85828 Personal history of other malignant neoplasm of skin: Secondary | ICD-10-CM | POA: Diagnosis not present

## 2015-12-22 DIAGNOSIS — D485 Neoplasm of uncertain behavior of skin: Secondary | ICD-10-CM | POA: Diagnosis not present

## 2016-01-11 DIAGNOSIS — C4442 Squamous cell carcinoma of skin of scalp and neck: Secondary | ICD-10-CM | POA: Diagnosis not present

## 2016-01-11 DIAGNOSIS — Z85828 Personal history of other malignant neoplasm of skin: Secondary | ICD-10-CM | POA: Diagnosis not present

## 2016-01-13 DIAGNOSIS — K648 Other hemorrhoids: Secondary | ICD-10-CM | POA: Diagnosis not present

## 2016-01-13 DIAGNOSIS — K641 Second degree hemorrhoids: Secondary | ICD-10-CM | POA: Diagnosis not present

## 2016-01-13 DIAGNOSIS — Z1211 Encounter for screening for malignant neoplasm of colon: Secondary | ICD-10-CM | POA: Diagnosis not present

## 2016-01-13 LAB — HM COLONOSCOPY

## 2016-01-20 NOTE — Progress Notes (Signed)
Subjective:   Carrie [name] is a 70 y.o. female who presents for an Initial Medicare Annual Wellness Visit.  Review of Systems    No ROS.  Medicare Wellness Visit.  Cardiac Risk Factors include: advanced age (>95men, >78 women);hypertension  Sleep patterns: has interrupted sleep. Gets up 2-3x times nightly to void.   Home Safety/Smoke Alarms: Feels safe in home. Smoke alarms in place.   Living environment; residence and Firearm Safety: Lives in home w/ husband. No issues navigating stairs, shower, etc Seat Belt Safety/Bike Helmet: Wears seat belt.   Counseling:   Eye Exam- sees eye doctor every year. Dr. Ok Edwards. Wears bilateral contacts.  Dental- Dr. Randol Kern every 6 months  Female:   Pap- last 03/11/14, NEGATIVE FOR INTRAEPITHELIAL LESIONS OR MALIGNANCY.     Mammo- last 03/16/11, BI-RADS CATEGORY 2:  Benign finding(s).       Dexa scan- last 03/12/09, osteopenia. Next scheduled soon, per GYN.       CCS- last 02/07/07, historical, no report on file     Objective:    Today's Vitals   01/21/16 1356  BP: (!) 104/54  Pulse: (!) 58  Temp: 97.5 F (36.4 C)  TempSrc: Oral  SpO2: 95%  Weight: 126 lb 6.4 oz (57.3 kg)   Body mass index is 20.4 kg/m.   Current Medications (verified) Outpatient Encounter Prescriptions as of 01/21/2016  Medication Sig  . Ascorbic Acid (VITAMIN C) 1000 MG tablet Take 1,000 mg by mouth daily.  Marland Kitchen atenolol (TENORMIN) 25 MG tablet TAKE ONE HALF TABLETS (12.5 MG TOTAL) BY MOUTH DAILY.  . Calcium-Vitamin D-Vitamin K (CALCIUM SOFT CHEWS PO) Take 1 tablet by mouth 2 (two) times daily.  . chlorthalidone (HYGROTON) 25 MG tablet TAKE ONE HALF TABLETS (12.5 MG TOTAL) BY MOUTH DAILY.  Marland Kitchen Cholecalciferol (VITAMIN D3) 1000 UNITS CAPS Take 1 capsule by mouth daily.  Marland Kitchen glucosamine-chondroitin 500-400 MG tablet Take 1 tablet by mouth daily.    . Omega-3 Fatty Acids (FISH OIL) 1200 MG CAPS Take 1,200 mg by mouth 2 (two) times daily.   . ranitidine (ZANTAC) 300  MG tablet Take 300 mg by mouth daily.  . ranitidine (ZANTAC) 300 MG tablet TAKE 1 TABLET (300 MG TOTAL) BY MOUTH AS NEEDED.  . Vitamins-Lipotropics (B-50) TABS Take 1 tablet by mouth daily.   . Cyanocobalamin (VITAMIN B 12 PO) Take 1,000 mg by mouth daily.    No facility-administered encounter medications on file as of 01/21/2016.     Allergies (verified) Hydrocodone-acetaminophen; Codeine; Demerol; Hydrocodone-acetaminophen; and Meperidine hcl   History: Past Medical History:  Diagnosis Date  . Breast cancer ([place]) 08/2004   Left breast invasive ductal carcinoma  . CHICKENPOX, HX OF 03/03/2010  . COMMON MIGRAINE 03/03/2010  . DEGENERATIVE DISC DISEASE 03/03/2010  . Essential hypertension, benign 03/03/2010  . HYPOKALEMIA 03/03/2010  . Increased vitamin B12 level 01/10/2015  . MEASLES, HX OF 03/03/2010  . Melanoma of skin, site unspecified 03/03/2010  . 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 NEOPLASM, MALIGNANT, BREAST, HX OF 03/03/2010  . OSTEOPENIA 03/03/2010  . PALPITATIONS, HX OF 03/03/2010  . Pulmonary nodules 01/07/2015  . Rib fracture    left, 3 fractures s/p radiation: right side 2 fractured, no surgery   Past Surgical History:  Procedure Laterality Date  . BREAST LUMPECTOMY WITH NEEDLE LOCALIZATION AND AXILLARY SENTINEL LYMPH NODE BX Left 08/2004  . clavicle dislocate  70 yrs old   right, reapproximated w/pins, subsequently removed  . HERNIA REPAIR  05/2012  .  left hip traumatic history     hematoma surgically evacuated  . SHOULDER ARTHROSCOPY     b/l shoulder with debridement  . TONSILLECTOMY     Family History  Problem Relation Age of Onset  . Hypertension Mother   . COPD Mother   . Alcohol abuse Father     history of  . Coronary artery disease Father   . Osteoarthritis Brother     knees  . Basal cell carcinoma Sister   . Heart disease Maternal Grandmother   . Stroke Maternal Grandfather   . Osteoarthritis Sister     knees  . Migraines Sister    Social History    Occupational History  . Not on file.   Social History Main Topics  . Smoking status: Former Smoker    Packs/day: 0.25    Quit date: 02/07/1968  . Smokeless tobacco: Never Used  . Alcohol use 3.0 oz/week    5 Glasses of wine per week  . Drug use: No  . 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 N N  Vision? N N  Difficulty concentrating or making decisions? N N  Walking or climbing stairs? N N  Dressing or bathing? N N  Doing errands, shopping? N N  Preparing Food and eating ? N -  Using the Toilet? N -  In the past six months, have you accidently leaked urine? N -  Do you have problems with loss of bowel control? N -  Managing your Medications? N -  Managing your Finances? N -  Housekeeping or managing your Housekeeping? N -  Some recent data might be hidden    Immunizations and Health Maintenance Immunization History  Administered Date(s) Administered  . Influenza Whole 10/07/2009  . Influenza,inj,Quad PF,36+ Mos 01/07/2015  . Influenza-Unspecified 11/07/2015  . Pneumococcal Conjugate-13 12/16/2013   Health Maintenance Due  Topic Date Due  . TETANUS/TDAP  09/08/1966  . ZOSTAVAX  09/08/2007  . PNA vac Low Risk Adult (2 of 2 - PPSV23) 12/17/2014  . MAMMOGRAM  02/19/2015    Patient Care Team: [name], MD as PCP - General (Family Medicine) Justice Britain, MD as Consulting Physician (Orthopedic Surgery) Danella Sensing, MD as Consulting Physician (Dermatology) Richmond Campbell, MD as Consulting Physician (Gastroenterology) Fay Records, MD as Consulting Physician (Cardiology) Linda Hedges, DO as Consulting Physician (Obstetrics and Gynecology)  Indicate  any recent Medical Services you may have received from other than [facility] providers in the past year (date may be approximate).     Assessment:   This is a routine wellness examination for Carrie [name]. Physical assessment deferred to PCP.  Hearing/Vision screen Hearing Screening Comments: Able to hear conversational tones w/o difficulty. No issues reported. Passes whisper test.  Vision Screening Comments: No vision issues. Wears contacts in both eyes. Follows w/ Dr. Dyke Maes.  Dietary issues and exercise activities discussed: Current Exercise Habits: Home exercise routine, Type of exercise: strength training/weights;treadmill, Frequency (Times/Week): 5, Intensity: Intense, Exercise limited by: None identified   Diet (meal preparation, eat out, water intake, caffeinated beverages, dairy products, fruits and vegetables): in general, a "healthy" diet  , well balanced, on average, 2 meals per day. Lots of fruits and vegetables.  Does not eat breakfast. Drink lots of water. Does not drink soda or coffee. Eats most meals at home.      Goals    . Healthy Lifestyle          Continue to eat heart healthy diet (full of fruits, vegetables, whole grains, lean protein, water--limit salt, fat, and sugar intake) and increase physical activity as tolerated. Continue doing brain stimulating activities (puzzles, reading, adult coloring books, staying active) to keep memory sharp.        Depression Screen PHQ 2/9 Scores 01/21/2016 07/13/2015 07/03/2014  PHQ - 2 Score 0 0 0    Fall Risk Fall Risk  01/21/2016 07/13/2015 07/03/2014  Falls in the past year? No No No    Cognitive Function: MMSE - Mini Mental State Exam 01/21/2016  Orientation to time 5  Orientation to Place 5  Registration 3  Attention/ Calculation 5  Recall 3  Language- name 2 objects 2  Language- repeat 1  Language- follow 3 step command 3  Language- read & follow direction 1  Write a sentence 1  Copy design 1  Total score 30         Screening Tests Health Maintenance  Topic Date Due  . TETANUS/TDAP  09/08/1966  . ZOSTAVAX  09/08/2007  . PNA vac Low Risk Adult (2 of 2 - PPSV23) 12/17/2014  . MAMMOGRAM  02/19/2015  . COLONOSCOPY  01/12/2026  . INFLUENZA VACCINE  Addressed  . DEXA SCAN  Completed  . Hepatitis C Screening  Completed      Plan:    Bring a copy of your advance directives to your next office visit. Follow-up w/ Dr. Charlett Blake as scheduled.  [facility] for immunization record-she has NOT had PPSV-23 w/ them, so is due at next visit. Tdap UTD abstracted from [facility]). Zostavax-pt has h/o Shingles, thinks has received shingles vaccine in the past.  MMG and DEXA upcoming-per GYN.  During the course of the visit, Cheyan was educated and counseled about the following appropriate screening and preventive services:   Vaccines to include Pneumoccal, Influenza, Hepatitis B, Td, Zostavax, HCV  Cardiovascular disease screening  Colorectal cancer screening  Bone density screening  Diabetes screening  Glaucoma screening  Mammography/PAP  Nutrition counseling  Patient Instructions (the written plan) were given to the patient.    Dorrene German, RN   01/21/2016   RN AWV check note reviewed. Agree with documention and plan.  Penni Homans, MD

## 2016-01-21 ENCOUNTER — Ambulatory Visit (INDEPENDENT_AMBULATORY_CARE_PROVIDER_SITE_OTHER): Payer: Medicare Other | Admitting: Family Medicine

## 2016-01-21 ENCOUNTER — Encounter: Payer: Self-pay | Admitting: Family Medicine

## 2016-01-21 VITALS — BP 104/54 | HR 58 | Temp 97.5°F | Wt 126.4 lb

## 2016-01-21 DIAGNOSIS — I493 Ventricular premature depolarization: Secondary | ICD-10-CM

## 2016-01-21 DIAGNOSIS — C4492 Squamous cell carcinoma of skin, unspecified: Secondary | ICD-10-CM | POA: Diagnosis not present

## 2016-01-21 DIAGNOSIS — M25579 Pain in unspecified ankle and joints of unspecified foot: Secondary | ICD-10-CM | POA: Insufficient documentation

## 2016-01-21 DIAGNOSIS — IMO0002 Reserved for concepts with insufficient information to code with codable children: Secondary | ICD-10-CM | POA: Insufficient documentation

## 2016-01-21 DIAGNOSIS — I10 Essential (primary) hypertension: Secondary | ICD-10-CM | POA: Diagnosis not present

## 2016-01-21 DIAGNOSIS — K219 Gastro-esophageal reflux disease without esophagitis: Secondary | ICD-10-CM

## 2016-01-21 DIAGNOSIS — Z0000 Encounter for general adult medical examination without abnormal findings: Secondary | ICD-10-CM | POA: Diagnosis not present

## 2016-01-21 DIAGNOSIS — R748 Abnormal levels of other serum enzymes: Secondary | ICD-10-CM

## 2016-01-21 DIAGNOSIS — E876 Hypokalemia: Secondary | ICD-10-CM

## 2016-01-21 DIAGNOSIS — M79671 Pain in right foot: Secondary | ICD-10-CM

## 2016-01-21 DIAGNOSIS — M79672 Pain in left foot: Secondary | ICD-10-CM

## 2016-01-21 DIAGNOSIS — C439 Malignant melanoma of skin, unspecified: Secondary | ICD-10-CM

## 2016-01-21 LAB — COMPREHENSIVE METABOLIC PANEL
ALK PHOS: 119 U/L (ref 33–130)
ALT: 10 U/L (ref 6–29)
AST: 17 U/L (ref 10–35)
Albumin: 3.9 g/dL (ref 3.6–5.1)
BILIRUBIN TOTAL: 0.5 mg/dL (ref 0.2–1.2)
BUN: 15 mg/dL (ref 7–25)
CO2: 28 mmol/L (ref 20–31)
CREATININE: 0.68 mg/dL (ref 0.50–0.99)
Calcium: 8.8 mg/dL (ref 8.6–10.4)
Chloride: 105 mmol/L (ref 98–110)
GLUCOSE: 80 mg/dL (ref 65–99)
POTASSIUM: 3.8 mmol/L (ref 3.5–5.3)
Sodium: 141 mmol/L (ref 135–146)
TOTAL PROTEIN: 5.9 g/dL — AB (ref 6.1–8.1)

## 2016-01-21 LAB — LIPID PANEL
CHOL/HDL RATIO: 2 ratio (ref <5.0)
CHOLESTEROL: 200 mg/dL — AB (ref <200)
HDL: 101 mg/dL (ref >50)
LDL Cholesterol: 83 mg/dL (ref <100)
TRIGLYCERIDES: 79 mg/dL (ref <150)
VLDL: 16 mg/dL (ref <30)

## 2016-01-21 LAB — TSH: TSH: 0.84 mIU/L

## 2016-01-21 NOTE — Patient Instructions (Addendum)
Take 1 NOW Probiotic Daily. Preventive Care 6 Years and Older, Female Preventive care refers to lifestyle choices and visits with your health care provider that can promote health and wellness. What does preventive care include?  A yearly physical exam. This is also called an annual well check.  Dental exams once or twice a year.  Routine eye exams. Ask your health care provider how often you should have your eyes checked.  Personal lifestyle choices, including:  Daily care of your teeth and gums.  Regular physical activity.  Eating a healthy diet.  Avoiding tobacco and drug use.  Limiting alcohol use.  Practicing safe sex.  Taking low-dose aspirin every day.  Taking vitamin and mineral supplements as recommended by your health care provider. What happens during an annual well check? The services and screenings done by your health care provider during your annual well check will depend on your age, overall health, lifestyle risk factors, and family history of disease. Counseling  Your health care provider may ask you questions about your:  Alcohol use.  Tobacco use.  Drug use.  Emotional well-being.  Home and relationship well-being.  Sexual activity.  Eating habits.  History of falls.  Memory and ability to understand (cognition).  Work and work Statistician.  Reproductive health. Screening  You may have the following tests or measurements:  Height, weight, and BMI.  Blood pressure.  Lipid and cholesterol levels. These may be checked every 5 years, or more frequently if you are over 31 years old.  Skin check.  Lung cancer screening. You may have this screening every year starting at age 108 if you have a 30-pack-year history of smoking and currently smoke or have quit within the past 15 years.  Fecal occult blood test (FOBT) of the stool. You may have this test every year starting at age  41.  Flexible sigmoidoscopy or colonoscopy. You may have a sigmoidoscopy every 5 years or a colonoscopy every 10 years starting at age 68.  Hepatitis C blood test.  Hepatitis B blood test.  Sexually transmitted disease (STD) testing.  Diabetes screening. This is done by checking your blood sugar (glucose) after you have not eaten for a while (fasting). You may have this done every 1-3 years.  Bone density scan. This is done to screen for osteoporosis. You may have this done starting at age 43.  Mammogram. This may be done every 1-2 years. Talk to your health care provider about how often you should have regular mammograms. Talk with your health care provider about your test results, treatment options, and if necessary, the need for more tests. Vaccines  Your health care provider may recommend certain vaccines, such as:  Influenza vaccine. This is recommended every year.  Tetanus, diphtheria, and acellular pertussis (Tdap, Td) vaccine. You may need a Td booster every 10 years.  Varicella vaccine. You may need this if you have not been vaccinated.  Zoster vaccine. You may need this after age 35.  Measles, mumps, and rubella (MMR) vaccine. You may need at least one dose of MMR if you were born in 1957 or later. You may also need a second dose.  Pneumococcal 13-valent conjugate (PCV13) vaccine. One dose is recommended after age 98.  Pneumococcal polysaccharide (PPSV23) vaccine. One dose is recommended after age 36.  Meningococcal vaccine. You may need this if you have certain conditions.  Hepatitis A vaccine. You may need this if you have certain conditions or if you travel or work in places where  you may be exposed to hepatitis A.  Hepatitis B vaccine. You may need this if you have certain conditions or if you travel or work in places where you may be exposed to hepatitis B.  Haemophilus influenzae type b (Hib) vaccine. You may need this if you have certain conditions. Talk to  your health care provider about which screenings and vaccines you need and how often you need them. This information is not intended to replace advice given to you by your health care provider. Make sure you discuss any questions you have with your health care provider. Document Released: 02/19/2015 Document Revised: 10/13/2015 Document Reviewed: 11/24/2014 Elsevier Interactive Patient Education  2017 [employer]. tic daily

## 2016-01-21 NOTE — Progress Notes (Signed)
Subjective:    Patient ID: [name], female    DOB: 1945/07/25, 70 y.o.   MRN: [id number]  No chief complaint on file.   HPI Patient is in today for follow up.No acute cocnerns noted at this time.  Past Medical History:  Diagnosis Date  . Breast cancer ([place]) 08/2004   Left breast invasive ductal carcinoma  . CHICKENPOX, HX OF 03/03/2010  . COMMON MIGRAINE 03/03/2010  . DEGENERATIVE DISC DISEASE 03/03/2010  . Essential hypertension, benign 03/03/2010  . HYPOKALEMIA 03/03/2010  . Increased vitamin B12 level 01/10/2015  . MEASLES, HX OF 03/03/2010  . Melanoma of skin, site unspecified 03/03/2010  . 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 NEOPLASM, MALIGNANT, BREAST, HX OF 03/03/2010  . OSTEOPENIA 03/03/2010  . PALPITATIONS, HX OF 03/03/2010  . Pulmonary nodules 01/07/2015  . Rib fracture    left, 3 fractures s/p radiation: right side 2 fractured, no surgery    Past Surgical History:  Procedure Laterality Date  . BREAST LUMPECTOMY WITH NEEDLE LOCALIZATION AND AXILLARY SENTINEL LYMPH NODE BX Left 08/2004  . clavicle dislocate  70 yrs old   right, reapproximated w/pins, subsequently removed  . HERNIA REPAIR  05/2012  . left hip traumatic history     hematoma surgically evacuated  . SHOULDER ARTHROSCOPY     b/l shoulder with debridement  . TONSILLECTOMY      Family History  Problem Relation Age of Onset  . Hypertension Mother   . COPD Mother   . Alcohol abuse Father     history of  . Coronary artery disease Father   . Osteoarthritis Brother     knees  . Basal cell carcinoma Sister   . Heart disease Maternal Grandmother   . Stroke Maternal Grandfather   . Osteoarthritis Sister     knees  . Migraines Sister     Social History   Social History  . Marital status: Married    Spouse name: N/A  . Number of children: N/A  . Years of education: N/A   Occupational History  . Not on file.   Social History Main Topics  . Smoking status: Former Smoker    Packs/day: 0.25   Quit date: 02/07/1968  . Smokeless tobacco: Never Used  . Alcohol use 3.0 oz/week    5 Glasses of wine per week  . Drug use: No  . 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 Not on file   Social History Narrative  . No narrative on file    Outpatient Medications Prior to Visit  Medication Sig Dispense Refill  . Ascorbic Acid (VITAMIN C) 1000 MG tablet Take 1,000 mg by mouth daily.    Marland Kitchen atenolol (TENORMIN) 25 MG tablet TAKE ONE HALF TABLETS (12.5 MG TOTAL) BY MOUTH DAILY. 90 tablet 0  . Calcium-Vitamin D-Vitamin K (CALCIUM SOFT CHEWS PO) Take 1 tablet by mouth 2 (two) times daily.    . chlorthalidone (HYGROTON) 25 MG tablet TAKE ONE HALF TABLETS (12.5 MG TOTAL) BY MOUTH DAILY. 90 tablet 0  . Cholecalciferol (VITAMIN D3) 1000 UNITS CAPS Take 1 capsule by mouth daily.    Marland Kitchen glucosamine-chondroitin 500-400 MG tablet Take 1 tablet by mouth daily.      . Omega-3 Fatty Acids (FISH OIL) 1200 MG CAPS Take 1,200 mg by mouth 2 (two) times daily.     . ranitidine (ZANTAC)  300 MG tablet Take 300 mg by mouth daily.    . ranitidine (ZANTAC) 300 MG tablet TAKE 1 TABLET (300 MG TOTAL) BY MOUTH AS NEEDED. 90 tablet 1  . Vitamins-Lipotropics (B-50) TABS Take 1 tablet by mouth daily.     . Cyanocobalamin (VITAMIN B 12 PO) Take 1,000 mg by mouth daily.      No facility-administered medications prior to visit.     Allergies  Allergen Reactions  . Hydrocodone-Acetaminophen Other (See Comments)    Slows Respiration Rate Down  . Codeine     REACTION: upsets stomach  . Demerol     hives  . Hydrocodone-Acetaminophen     REACTION: slows respiratory rate down  . Meperidine Hcl     REACTION: Hives    Review of Systems  Constitutional: Negative for fever.  Eyes: Negative for blurred vision.  Respiratory: Negative for cough and shortness of breath.   Cardiovascular: Negative  for chest pain.  Gastrointestinal: Negative for vomiting.  Musculoskeletal: Negative for back pain.  Skin: Negative for rash.  Neurological: Negative for loss of consciousness and headaches.       Objective:    Physical Exam  Constitutional: She is oriented to person, place, and time. She appears well-developed and well-nourished. No distress.  HENT:  Head: Normocephalic and atraumatic.  Eyes: Conjunctivae are normal.  Neck: Normal range of motion. No thyromegaly present.  Cardiovascular: Normal rate and regular rhythm.   Pulmonary/Chest: Effort normal and breath sounds normal. She has no wheezes.  Abdominal: Soft. Bowel sounds are normal. There is no tenderness.  Musculoskeletal: She exhibits no edema or deformity.  Neurological: She is alert and oriented to person, place, and time.  Skin: Skin is warm and dry. She is not diaphoretic.  Psychiatric: She has a normal mood and affect.    BP (!) 104/54 (BP Location: Right Arm, Patient Position: Sitting, Cuff Size: Normal)   Pulse (!) 58   Temp 97.5 F (36.4 C) (Oral)   Wt 126 lb 6.4 oz (57.3 kg)   SpO2 95% Comment: RA  BMI 20.40 kg/m  Wt Readings from Last 3 Encounters:  01/21/16 126 lb 6.4 oz (57.3 kg)  07/13/15 127 lb 4 oz (57.7 kg)  05/17/15 123 lb 9.6 oz (56.1 kg)     Lab Results  Component Value Date   WBC 5.2 07/13/2015   HGB 13.0 07/13/2015   HCT 38.2 07/13/2015   PLT 180.0 07/13/2015   GLUCOSE 93 07/13/2015   CHOL 208 (H) 07/13/2015   TRIG 101.0 07/13/2015   HDL 95.50 07/13/2015   LDLDIRECT 94.8 07/28/2010   LDLCALC 92 07/13/2015   ALT 10 07/13/2015   AST 16 07/13/2015   NA 140 07/13/2015   K 3.4 (L) 07/13/2015   CL 102 07/13/2015   CREATININE 0.57 07/13/2015   BUN 13 07/13/2015   CO2 33 (H) 07/13/2015   TSH 0.55 07/13/2015    Lab Results  Component Value Date   TSH 0.55 07/13/2015   Lab Results  Component Value Date   WBC 5.2 07/13/2015   HGB 13.0 07/13/2015   HCT 38.2 07/13/2015   MCV  100.8 (H) 07/13/2015   PLT 180.0 07/13/2015   Lab Results  Component Value Date   NA 140 07/13/2015   K 3.4 (L) 07/13/2015   CO2 33 (H) 07/13/2015   GLUCOSE 93 07/13/2015   BUN 13 07/13/2015   CREATININE 0.57 07/13/2015   BILITOT 0.6 07/13/2015   ALKPHOS 115 07/13/2015   AST 16  07/13/2015   ALT 10 07/13/2015   PROT 6.3 07/13/2015   ALBUMIN 3.9 07/13/2015   CALCIUM 9.0 07/13/2015   GFR 112.16 07/13/2015   Lab Results  Component Value Date   CHOL 208 (H) 07/13/2015   Lab Results  Component Value Date   HDL 95.50 07/13/2015   Lab Results  Component Value Date   LDLCALC 92 07/13/2015   Lab Results  Component Value Date   TRIG 101.0 07/13/2015   Lab Results  Component Value Date   CHOLHDL 2 07/13/2015   No results found for: HGBA1C    I acted as a Education administrator for Dr. Charlett Blake. Raiford Noble, CMA   Assessment & Plan:   Problem List Items Addressed This Visit    None      I am having Ms. Venuto maintain her B-50, Fish Oil, glucosamine-chondroitin, Cyanocobalamin (VITAMIN B 12 PO), Calcium-Vitamin D-Vitamin K (CALCIUM SOFT CHEWS PO), Vitamin D3, vitamin C, ranitidine, ranitidine, atenolol, and chlorthalidone.  No orders of the defined types were placed in this encounter.    Shan Levans, CMA

## 2016-01-21 NOTE — Assessment & Plan Note (Signed)
H/o but well controlled on Atenolol and Chlorthalidone. Will hold Chlorthalidone for now

## 2016-01-21 NOTE — Progress Notes (Addendum)
Subjective:    Patient ID: Carrie [name], female    DOB: [birth year], 70 y.o.   MRN: [id number]  Chief Complaint  Patient presents with  . Medicare Wellness    HPI Patient is in today for follow up on numerous medical concerns. No recent illness or hospitalizations. Had stitches placed in scalp last week after another scc was removed. It is healing well. No pain or bleeding at this time. Endorses pain on bottoms of both feet especially after being on feet. No injury or acute illness. No recent hospitalizations. Denies CP/palp/SOB/HA/congestion/fevers/GI or GU c/o. Taking meds as prescribed  Past Medical History:  Diagnosis Date  . Breast cancer ([place]) 08/2004   Left breast invasive ductal carcinoma  . CHICKENPOX, HX OF 03/03/2010  . COMMON MIGRAINE 03/03/2010  . DEGENERATIVE DISC DISEASE 03/03/2010  . Essential hypertension, benign 03/03/2010  . Foot pain, bilateral 02/08/2016  . HYPOKALEMIA 03/03/2010  . Increased vitamin B12 level 01/10/2015  . MEASLES, HX OF 03/03/2010  . Melanoma of skin, site unspecified 03/03/2010  . 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 NEOPLASM, MALIGNANT, BREAST, HX OF 03/03/2010  . OSTEOPENIA 03/03/2010  . PALPITATIONS, HX OF 03/03/2010  . Pulmonary nodules 01/07/2015  . Rib fracture    left, 3 fractures s/p radiation: right side 2 fractured, no surgery    Past Surgical History:  Procedure Laterality Date  . BREAST LUMPECTOMY WITH NEEDLE LOCALIZATION AND AXILLARY SENTINEL LYMPH NODE BX Left 08/2004  . clavicle dislocate  70 yrs old   right, reapproximated w/pins, subsequently removed  . HERNIA REPAIR  05/2012  . left hip traumatic history     hematoma surgically evacuated  . SHOULDER ARTHROSCOPY     b/l shoulder with debridement  . TONSILLECTOMY      Family History  Problem Relation Age of Onset  . Hypertension Mother   . COPD Mother   . Alcohol abuse Father     history of  . Coronary artery disease Father   . Osteoarthritis Brother     knees  . Basal  cell carcinoma Sister   . Heart disease Maternal Grandmother   . Stroke Maternal Grandfather   . Osteoarthritis Sister     knees  . Migraines Sister     Social History   Social History  . Marital status: Married    Spouse name: N/A  . Number of children: N/A  . Years of education: N/A   Occupational History  . Not on file.   Social History Main Topics  . Smoking status: Former Smoker    Packs/day: 0.25    Quit date: 02/07/1968  . Smokeless tobacco: Never Used  . Alcohol use 3.0 oz/week    5 Glasses of wine per week  . Drug use: No  . 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 Not on file   Social History Narrative  . No narrative on file    Outpatient Medications Prior to Visit  Medication Sig Dispense Refill  . Ascorbic Acid (VITAMIN C) 1000 MG tablet Take 1,000 mg by mouth daily.    Marland Kitchen atenolol (TENORMIN) 25 MG tablet TAKE ONE HALF TABLETS (12.5 MG TOTAL) BY MOUTH DAILY. 90 tablet 0  . Calcium-Vitamin D-Vitamin K (CALCIUM SOFT CHEWS PO) Take 1 tablet by mouth 2 (two) times daily.    . chlorthalidone (HYGROTON) 25 MG tablet TAKE ONE  HALF TABLETS (12.5 MG TOTAL) BY MOUTH DAILY. 90 tablet 0  . Cholecalciferol (VITAMIN D3) 1000 UNITS CAPS Take 1 capsule by mouth daily.    Marland Kitchen glucosamine-chondroitin 500-400 MG tablet Take 1 tablet by mouth daily.      . Omega-3 Fatty Acids (FISH OIL) 1200 MG CAPS Take 1,200 mg by mouth 2 (two) times daily.     . ranitidine (ZANTAC) 300 MG tablet Take 300 mg by mouth daily.    . ranitidine (ZANTAC) 300 MG tablet TAKE 1 TABLET (300 MG TOTAL) BY MOUTH AS NEEDED. 90 tablet 1  . Vitamins-Lipotropics (B-50) TABS Take 1 tablet by mouth daily.     . Cyanocobalamin (VITAMIN B 12 PO) Take 1,000 mg by mouth daily.      No facility-administered medications prior to visit.     Allergies  Allergen Reactions  .  Hydrocodone-Acetaminophen Other (See Comments)    Slows Respiration Rate Down  . Codeine     REACTION: upsets stomach  . Demerol     hives  . Hydrocodone-Acetaminophen     REACTION: slows respiratory rate down  . Meperidine Hcl     REACTION: Hives    Review of Systems  Constitutional: Negative for fever and malaise/fatigue.  HENT: Negative for congestion.   Eyes: Negative for blurred vision.  Respiratory: Negative for shortness of breath.   Cardiovascular: Negative for chest pain, palpitations and leg swelling.  Gastrointestinal: Negative for abdominal pain, blood in stool and nausea.  Genitourinary: Negative for dysuria and frequency.  Musculoskeletal: Positive for joint pain. Negative for falls.  Skin: Negative for rash.  Neurological: Negative for dizziness, loss of consciousness and headaches.  Endo/Heme/Allergies: Negative for environmental allergies.  Psychiatric/Behavioral: Negative for depression. The patient is not nervous/anxious.        Objective:    Physical Exam  Constitutional: She is oriented to person, place, and time. She appears well-developed and well-nourished. No distress.  HENT:  Head: Normocephalic and atraumatic.  Nose: Nose normal.  Eyes: Right eye exhibits no discharge. Left eye exhibits no discharge.  Neck: Normal range of motion. Neck supple.  Cardiovascular: Normal rate and regular rhythm.   No murmur heard. Pulmonary/Chest: Effort normal and breath sounds normal.  Abdominal: Soft. Bowel sounds are normal. There is no tenderness.  Musculoskeletal: She exhibits no edema.  Neurological: She is alert and oriented to person, place, and time.  Skin: Skin is warm and dry.  Psychiatric: She has a normal mood and affect.  Nursing note and vitals reviewed.   BP (!) 104/54 (BP Location: Right Arm, Patient Position: Sitting, Cuff Size: Normal)   Pulse (!) 58   Temp 97.5 F (36.4 C) (Oral)   Wt 126 lb 6.4 oz (57.3 kg)   SpO2 95% Comment: RA  BMI  20.40 kg/m  Wt Readings from Last 3 Encounters:  01/21/16 126 lb 6.4 oz (57.3 kg)  07/13/15 127 lb 4 oz (57.7 kg)  05/17/15 123 lb 9.6 oz (56.1 kg)     Lab Results  Component Value Date   WBC 5.2 07/13/2015   HGB 13.0 07/13/2015   HCT 38.2 07/13/2015   PLT 180.0 07/13/2015   GLUCOSE 80 01/21/2016   CHOL 200 (H) 01/21/2016   TRIG 79 01/21/2016   HDL 101 01/21/2016   LDLDIRECT 94.8 07/28/2010   LDLCALC 83 01/21/2016   ALT 10 01/21/2016   AST 17 01/21/2016   NA 141 01/21/2016   K 3.8 01/21/2016   CL 105 01/21/2016   CREATININE  0.68 01/21/2016   BUN 15 01/21/2016   CO2 28 01/21/2016   TSH 0.84 01/21/2016    Lab Results  Component Value Date   TSH 0.84 01/21/2016   Lab Results  Component Value Date   WBC 5.2 07/13/2015   HGB 13.0 07/13/2015   HCT 38.2 07/13/2015   MCV 100.8 (H) 07/13/2015   PLT 180.0 07/13/2015   Lab Results  Component Value Date   NA 141 01/21/2016   K 3.8 01/21/2016   CO2 28 01/21/2016   GLUCOSE 80 01/21/2016   BUN 15 01/21/2016   CREATININE 0.68 01/21/2016   BILITOT 0.5 01/21/2016   ALKPHOS 119 01/21/2016   AST 17 01/21/2016   ALT 10 01/21/2016   PROT 5.9 (L) 01/21/2016   ALBUMIN 3.9 01/21/2016   CALCIUM 8.8 01/21/2016   GFR 112.16 07/13/2015   Lab Results  Component Value Date   CHOL 200 (H) 01/21/2016   Lab Results  Component Value Date   HDL 101 01/21/2016   Lab Results  Component Value Date   LDLCALC 83 01/21/2016   Lab Results  Component Value Date   TRIG 79 01/21/2016   Lab Results  Component Value Date   CHOLHDL 2.0 01/21/2016   No results found for: HGBA1C     Assessment & Plan:   Problem List Items Addressed This Visit    Melanoma of skin ([place])    No recent episodes but just had a SCC removed from her scalp and she continues to follow with dermatology      HYPOKALEMIA    Will stop Chlorthalidone. Check CMP today      ESSENTIAL HYPERTENSION, BENIGN    Has been noting some some low blood pressure  readings lately notes 102/52 last time she tried to give blood.       Relevant Orders   Lipid panel (Completed)   TSH (Completed)   Comprehensive metabolic panel (Completed)   GERD (gastroesophageal reflux disease)    Avoid offending foods, start probiotics. Do not eat large meals in late evening and consider raising head of bed.       PVC's (premature ventricular contractions)    H/o but well controlled on Atenolol and Chlorthalidone. Will hold Chlorthalidone for now      Increased vitamin B12 level   Squamous cell carcinoma    Recently had a new SCC removed from her scalp.      Foot pain, bilateral    Describes pain on bottom of both feet over last couple of months without injury. Worse after being on feet all day. Encouraged good shoes, inserts and topical treatments, let us know if no imrpovement       Other Visit Diagnoses    Encounter for Medicare annual wellness exam    -  Primary      I am having Ms. Cottone maintain her B-50, Fish Oil, glucosamine-chondroitin, Cyanocobalamin (VITAMIN B 12 PO), Calcium-Vitamin D-Vitamin K (CALCIUM SOFT CHEWS PO), Vitamin D3, vitamin C, ranitidine, ranitidine, atenolol, and chlorthalidone.  No orders of the defined types were placed in this encounter.    Penni Homans, MD

## 2016-01-21 NOTE — Assessment & Plan Note (Signed)
No recent episodes but just had a SCC removed from her scalp and she continues to follow with dermatology

## 2016-01-21 NOTE — Progress Notes (Signed)
Patient ID: Carrie [name], female   DOB: [birth year], 70 y.o.   MRN: [id number]

## 2016-01-21 NOTE — Assessment & Plan Note (Addendum)
Will stop Chlorthalidone. Check CMP today

## 2016-01-21 NOTE — Assessment & Plan Note (Signed)
Has been noting some some low blood pressure readings lately notes 102/52 last time she tried to give blood.

## 2016-01-24 ENCOUNTER — Ambulatory Visit: Payer: Medicare Other | Admitting: Internal Medicine

## 2016-02-08 ENCOUNTER — Encounter: Payer: Self-pay | Admitting: Family Medicine

## 2016-02-08 DIAGNOSIS — M79671 Pain in right foot: Secondary | ICD-10-CM

## 2016-02-08 DIAGNOSIS — M79672 Pain in left foot: Secondary | ICD-10-CM

## 2016-02-08 HISTORY — DX: Pain in right foot: M79.671

## 2016-02-08 NOTE — Assessment & Plan Note (Signed)
Recently had a new SCC removed from her scalp.

## 2016-02-08 NOTE — Assessment & Plan Note (Signed)
Describes pain on bottom of both feet over last couple of months without injury. Worse after being on feet all day. Encouraged good shoes, inserts and topical treatments, let us know if no imrpovement

## 2016-02-08 NOTE — Assessment & Plan Note (Signed)
Avoid offending foods, start probiotics. Do not eat large meals in late evening and consider raising head of bed.  

## 2016-02-10 ENCOUNTER — Encounter: Payer: Self-pay | Admitting: Family Medicine

## 2016-02-18 ENCOUNTER — Encounter: Payer: Self-pay | Admitting: Internal Medicine

## 2016-02-18 ENCOUNTER — Ambulatory Visit (INDEPENDENT_AMBULATORY_CARE_PROVIDER_SITE_OTHER): Payer: Medicare Other | Admitting: Internal Medicine

## 2016-02-18 VITALS — BP 120/82 | HR 54 | Ht 66.0 in | Wt 126.8 lb

## 2016-02-18 DIAGNOSIS — I493 Ventricular premature depolarization: Secondary | ICD-10-CM | POA: Diagnosis not present

## 2016-02-18 NOTE — Patient Instructions (Signed)
Your physician recommends that you continue on your current medications as directed. Please refer to the Current Medication list given to you today. Your physician wants you to follow-up in: 1 year with Dr. Ross.  You will receive a reminder letter in the mail two months in advance. If you don't receive a letter, please call our office to schedule the follow-up appointment.  

## 2016-02-18 NOTE — Progress Notes (Signed)
Cardiology Office Note   Date:  02/18/2016   ID:  Carrie [name], DOB 05/01/1945, MRN [id number]  PCP:  Penni Homans, MD  Cardiologist:   Dorris Carnes, MD   Pt returns for f/u of PVCs, palpitations      History of Present Illness: Carrie [name] is a 71 y.o. female with a history of PVCs   She ws previusly followed by T [facility]. Echo LVEF 55 to 65%  Pt Rx with atenolol.   Active   Pt gives blood regularly  Stopped chlorithaidone  Since stopping she says thatt she is doing good  No skips  Breathing OK    Works out 5x per wk  Cardio for 2  Strength for 3        Current Meds  Medication Sig  . Ascorbic Acid (VITAMIN C) 1000 MG tablet Take 1,000 mg by mouth daily.  Marland Kitchen atenolol (TENORMIN) 25 MG tablet TAKE ONE HALF TABLETS (12.5 MG TOTAL) BY MOUTH DAILY.  . Calcium-Vitamin D-Vitamin K (CALCIUM SOFT CHEWS PO) Take 1 tablet by mouth 2 (two) times daily.  . Cholecalciferol (VITAMIN D3) 1000 UNITS CAPS Take 1 capsule by mouth daily.  Marland Kitchen glucosamine-chondroitin 500-400 MG tablet Take 1 tablet by mouth daily.    . Omega-3 Fatty Acids (FISH OIL) 1200 MG CAPS Take 1,200 mg by mouth 2 (two) times daily.   . ranitidine (ZANTAC) 300 MG tablet TAKE 1 TABLET (300 MG TOTAL) BY MOUTH AS NEEDED.  . Vitamins-Lipotropics (B-50) TABS Take 1 tablet by mouth daily.      Allergies:   Hydrocodone-acetaminophen; Codeine; Demerol; Hydrocodone-acetaminophen; and Meperidine hcl   Past Medical History:  Diagnosis Date  . Breast cancer ([place]) 08/2004   Left breast invasive ductal carcinoma  . CHICKENPOX, HX OF 03/03/2010  . COMMON MIGRAINE 03/03/2010  . DEGENERATIVE DISC DISEASE 03/03/2010  . Essential hypertension, benign 03/03/2010  . Foot pain, bilateral 02/08/2016  . HYPOKALEMIA 03/03/2010  . Increased vitamin B12 level 01/10/2015  . MEASLES, HX OF 03/03/2010  . Melanoma of skin, site unspecified 03/03/2010  . 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 NEOPLASM, MALIGNANT, BREAST, HX OF 03/03/2010  .  OSTEOPENIA 03/03/2010  . PALPITATIONS, HX OF 03/03/2010  . Pulmonary nodules 01/07/2015  . Rib fracture    left, 3 fractures s/p radiation: right side 2 fractured, no surgery    Past Surgical History:  Procedure Laterality Date  . BREAST LUMPECTOMY WITH NEEDLE LOCALIZATION AND AXILLARY SENTINEL LYMPH NODE BX Left 08/2004  . clavicle dislocate  71 yrs old   right, reapproximated w/pins, subsequently removed  . HERNIA REPAIR  05/2012  . left hip traumatic history     hematoma surgically evacuated  . SHOULDER ARTHROSCOPY     b/l shoulder with debridement  . TONSILLECTOMY       Social History:  The patient  reports that she quit smoking about 48 years ago. She smoked 0.25 packs per day. She has never used smokeless tobacco. She reports that she drinks about 3.0 oz of alcohol per week . She reports that she does not use drugs.   Family History:  The patient's family history includes Alcohol abuse in her father; Basal cell carcinoma in her sister; COPD in her mother; Coronary artery disease in her father; Heart disease in her maternal grandmother; Hypertension in her mother; Migraines in her sister; Osteoarthritis in her brother and sister; Stroke in her maternal grandfather.    ROS:  Please see the history of present illness. All other  systems are reviewed and  Negative to the above problem except as noted.    PHYSICAL EXAM: VS:  BP 120/82   Pulse (!) 54   Ht 5\' 6"  (1.676 m)   Wt 126 lb 12.8 oz (57.5 kg)   BMI 20.47 kg/m   GEN: Well nourished, well developed, in no acute distress  HEENT: normal  Neck: no JVD, carotid bruits, or masses Cardiac: RRR; no murmurs, rubs, or gallops,no edema  Respiratory:  clear to auscultation bilaterally, normal work of breathing GI: soft, nontender, nondistended, + BS  No hepatomegaly  MS: no deformity Moving all extremities   Skin: warm and dry, no rash Neuro:  Strength and sensation are intact Psych: euthymic mood, full affect   EKG:  EKG is  ordered today.  SB  54 bpm     Lipid Panel    Component Value Date/Time   CHOL 200 (H) 01/21/2016 1454   TRIG 79 01/21/2016 1454   HDL 101 01/21/2016 1454   CHOLHDL 2.0 01/21/2016 1454   VLDL 16 01/21/2016 1454   LDLCALC 83 01/21/2016 1454   LDLDIRECT 94.8 07/28/2010 0913      Wt Readings from Last 3 Encounters:  02/18/16 126 lb 12.8 oz (57.5 kg)  01/21/16 126 lb 6.4 oz (57.3 kg)  07/13/15 127 lb 4 oz (57.7 kg)      ASSESSMENT AND PLAN:  1  PVCs  Pt doing good  Feels better off of chlorathalidone  Stays hydrated    2  BP  Good  Follow    Stays active Will see in 1 year     Current medicines are reviewed at length with the patient today.  The patient does not have concerns regarding medicines.  Signed, Dorris Carnes, MD  02/18/2016 1:50 PM    [facility] [place], [place], [place]  [address] Phone: [phone]; Fax: [phone]

## 2016-02-28 ENCOUNTER — Ambulatory Visit: Payer: Medicare Other | Admitting: Internal Medicine

## 2016-03-03 ENCOUNTER — Ambulatory Visit (HOSPITAL_BASED_OUTPATIENT_CLINIC_OR_DEPARTMENT_OTHER)
Admission: RE | Admit: 2016-03-03 | Discharge: 2016-03-03 | Disposition: A | Discharge status: Home / Self Care | Payer: Medicare Other | Source: Ambulatory Visit | Attending: Family Medicine | Admitting: Family Medicine

## 2016-03-03 ENCOUNTER — Encounter: Payer: Self-pay | Admitting: Family Medicine

## 2016-03-03 ENCOUNTER — Ambulatory Visit (INDEPENDENT_AMBULATORY_CARE_PROVIDER_SITE_OTHER): Payer: Medicare Other | Admitting: Family Medicine

## 2016-03-03 VITALS — BP 105/54 | HR 66 | Temp 97.8°F | Ht 66.0 in | Wt 125.2 lb

## 2016-03-03 DIAGNOSIS — R1032 Left lower quadrant pain: Secondary | ICD-10-CM

## 2016-03-03 DIAGNOSIS — R109 Unspecified abdominal pain: Secondary | ICD-10-CM | POA: Diagnosis not present

## 2016-03-03 NOTE — Progress Notes (Signed)
Pre visit review using our clinic review tool, if applicable. No additional management support is needed unless otherwise documented below in the visit note. 

## 2016-03-03 NOTE — Progress Notes (Signed)
Chief Complaint  Patient presents with  . Abdominal Pain    Pt reports abdominal pain x 1 daywith soreness on LT side     Carrie [name] is here for L sided abdominal pain.  Duration: 1 day Bleeding? No Palliation: None Provocation: Eating soup, no other triggers Associated symptoms: did have a T of 99.9 F this AM, more frequent stooling more often overnight Denies: nausea, vomiting and diarrhea Treatment to date: Antacids  ROS: Constitutional: + low grade fever GI: No N/V/D/C, no bleeding +L sided pain  Past Medical History:  Diagnosis Date  . Breast cancer ([place]) 08/2004   Left breast invasive ductal carcinoma  . CHICKENPOX, HX OF 03/03/2010  . COMMON MIGRAINE 03/03/2010  . DEGENERATIVE DISC DISEASE 03/03/2010  . Essential hypertension, benign 03/03/2010  . Foot pain, bilateral 02/08/2016  . HYPOKALEMIA 03/03/2010  . Increased vitamin B12 level 01/10/2015  . MEASLES, HX OF 03/03/2010  . Melanoma of skin, site unspecified 03/03/2010  . 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 NEOPLASM, MALIGNANT, BREAST, HX OF 03/03/2010  . OSTEOPENIA 03/03/2010  . PALPITATIONS, HX OF 03/03/2010  . Pulmonary nodules 01/07/2015  . Rib fracture    left, 3 fractures s/p radiation: right side 2 fractured, no surgery   Family History  Problem Relation Age of Onset  . Hypertension Mother   . COPD Mother   . Alcohol abuse Father     history of  . Coronary artery disease Father   . Osteoarthritis Brother     knees  . Basal cell carcinoma Sister   . Heart disease Maternal Grandmother   . Stroke Maternal Grandfather   . Osteoarthritis Sister     knees  . Migraines Sister    Past Surgical History:  Procedure Laterality Date  . BREAST LUMPECTOMY WITH NEEDLE LOCALIZATION AND AXILLARY SENTINEL LYMPH NODE BX Left 08/2004  . clavicle dislocate  71 yrs old   right, reapproximated w/pins, subsequently removed  . HERNIA REPAIR  05/2012  . left hip traumatic history     hematoma surgically evacuated  .  SHOULDER ARTHROSCOPY     b/l shoulder with debridement  . TONSILLECTOMY      BP (!) 105/54 (BP Location: Right Arm, Patient Position: Sitting, Cuff Size: Small)   Pulse 66   Temp 97.8 F (36.6 C) (Oral)   Ht 5\' 6"  (1.676 m)   Wt 125 lb 3.2 oz (56.8 kg)   SpO2 98%   BMI 20.21 kg/m  Gen.: Awake, alert, appears stated age 70: Mucous membranes moist without mucosal lesions Heart: Regular rate and rhythm without murmurs Lungs: Clear auscultation bilaterally, no rales or wheezing, normal effort without accessory muscle use. Abdomen: Bowel sounds are present. Abdomen is soft, Mildly TTP over the L side of abd, not over bladder or gastric region, nondistended, no masses or organomegaly. Negative Murphy's, Rovsing's, McBurney's, and Carnett's sign. Psych: Age appropriate judgment and insight. Normal mood and affect.  Left lower quadrant pain - Plan: DG Abd 2 Views  Orders as above. Reviewed imaging with pt. AXR showed some gas and stool, no stone or free air. Recommended Miralax and enema if that was not helping. ER if fever, inability keep down PO intake, bleeding. F/u prn. Pt voiced understanding and agreement to the plan.  [place], DO 03/03/16 3:34 PM

## 2016-03-03 NOTE — Patient Instructions (Signed)
Miralax 1-2 times per day over the next 1-2 days. If no improvement, try an enema.  If you start having bleeding, fevers, or inability to keep foods/liquids down, seek care.

## 2016-03-21 DIAGNOSIS — M8588 Other specified disorders of bone density and structure, other site: Secondary | ICD-10-CM | POA: Diagnosis not present

## 2016-03-21 DIAGNOSIS — Z6821 Body mass index (BMI) 21.0-21.9, adult: Secondary | ICD-10-CM | POA: Diagnosis not present

## 2016-03-21 DIAGNOSIS — N958 Other specified menopausal and perimenopausal disorders: Secondary | ICD-10-CM | POA: Diagnosis not present

## 2016-03-21 DIAGNOSIS — Z1231 Encounter for screening mammogram for malignant neoplasm of breast: Secondary | ICD-10-CM | POA: Diagnosis not present

## 2016-03-21 DIAGNOSIS — Z01419 Encounter for gynecological examination (general) (routine) without abnormal findings: Secondary | ICD-10-CM | POA: Diagnosis not present

## 2016-03-21 DIAGNOSIS — Z124 Encounter for screening for malignant neoplasm of cervix: Secondary | ICD-10-CM | POA: Diagnosis not present

## 2016-03-21 LAB — HM MAMMOGRAPHY

## 2016-03-21 LAB — HM DEXA SCAN

## 2016-03-21 LAB — HM PAP SMEAR: HM Pap smear: NEGATIVE

## 2016-04-18 DIAGNOSIS — L821 Other seborrheic keratosis: Secondary | ICD-10-CM | POA: Diagnosis not present

## 2016-04-18 DIAGNOSIS — L570 Actinic keratosis: Secondary | ICD-10-CM | POA: Diagnosis not present

## 2016-04-18 DIAGNOSIS — Z85828 Personal history of other malignant neoplasm of skin: Secondary | ICD-10-CM | POA: Diagnosis not present

## 2016-04-18 DIAGNOSIS — D225 Melanocytic nevi of trunk: Secondary | ICD-10-CM | POA: Diagnosis not present

## 2016-04-18 DIAGNOSIS — D1801 Hemangioma of skin and subcutaneous tissue: Secondary | ICD-10-CM | POA: Diagnosis not present

## 2016-04-27 ENCOUNTER — Other Ambulatory Visit: Payer: Self-pay | Admitting: Family Medicine

## 2016-05-05 DIAGNOSIS — H01024 Squamous blepharitis left upper eyelid: Secondary | ICD-10-CM | POA: Diagnosis not present

## 2016-05-05 DIAGNOSIS — H2513 Age-related nuclear cataract, bilateral: Secondary | ICD-10-CM | POA: Diagnosis not present

## 2016-05-05 DIAGNOSIS — H43811 Vitreous degeneration, right eye: Secondary | ICD-10-CM | POA: Diagnosis not present

## 2016-05-05 DIAGNOSIS — H01025 Squamous blepharitis left lower eyelid: Secondary | ICD-10-CM | POA: Diagnosis not present

## 2016-05-05 DIAGNOSIS — H01021 Squamous blepharitis right upper eyelid: Secondary | ICD-10-CM | POA: Diagnosis not present

## 2016-05-05 DIAGNOSIS — H01022 Squamous blepharitis right lower eyelid: Secondary | ICD-10-CM | POA: Diagnosis not present

## 2016-05-23 ENCOUNTER — Encounter: Payer: Self-pay | Admitting: Family Medicine

## 2016-06-05 DIAGNOSIS — H01022 Squamous blepharitis right lower eyelid: Secondary | ICD-10-CM | POA: Diagnosis not present

## 2016-06-05 DIAGNOSIS — H2513 Age-related nuclear cataract, bilateral: Secondary | ICD-10-CM | POA: Diagnosis not present

## 2016-06-05 DIAGNOSIS — H01025 Squamous blepharitis left lower eyelid: Secondary | ICD-10-CM | POA: Diagnosis not present

## 2016-06-05 DIAGNOSIS — H01024 Squamous blepharitis left upper eyelid: Secondary | ICD-10-CM | POA: Diagnosis not present

## 2016-06-05 DIAGNOSIS — H43811 Vitreous degeneration, right eye: Secondary | ICD-10-CM | POA: Diagnosis not present

## 2016-06-05 DIAGNOSIS — H01021 Squamous blepharitis right upper eyelid: Secondary | ICD-10-CM | POA: Diagnosis not present

## 2016-06-08 ENCOUNTER — Other Ambulatory Visit: Payer: Self-pay | Admitting: Family Medicine

## 2016-07-21 ENCOUNTER — Ambulatory Visit: Payer: Medicare Other | Admitting: Family Medicine

## 2016-07-24 ENCOUNTER — Ambulatory Visit: Payer: Medicare Other | Admitting: Family Medicine

## 2016-08-01 ENCOUNTER — Ambulatory Visit (INDEPENDENT_AMBULATORY_CARE_PROVIDER_SITE_OTHER): Payer: Medicare Other | Admitting: Family Medicine

## 2016-08-01 ENCOUNTER — Encounter: Payer: Self-pay | Admitting: Family Medicine

## 2016-08-01 DIAGNOSIS — G4700 Insomnia, unspecified: Secondary | ICD-10-CM

## 2016-08-01 DIAGNOSIS — K219 Gastro-esophageal reflux disease without esophagitis: Secondary | ICD-10-CM | POA: Diagnosis not present

## 2016-08-01 DIAGNOSIS — I10 Essential (primary) hypertension: Secondary | ICD-10-CM | POA: Diagnosis not present

## 2016-08-01 DIAGNOSIS — M545 Low back pain, unspecified: Secondary | ICD-10-CM

## 2016-08-01 HISTORY — DX: Insomnia, unspecified: G47.00

## 2016-08-01 HISTORY — DX: Low back pain, unspecified: M54.50

## 2016-08-01 MED ORDER — TIZANIDINE HCL 4 MG PO TABS: 2.0000 mg | ORAL_TABLET | Freq: Every evening | ORAL | 2 refills | Status: DC | PRN

## 2016-08-01 MED ORDER — RANITIDINE HCL 300 MG PO TABS: 150.0000 mg | ORAL_TABLET | Freq: Every day | ORAL | 1 refills | Status: DC

## 2016-08-01 NOTE — Assessment & Plan Note (Addendum)
Avoid offending foods, start probiotics. Do not eat large meals in late evening and consider raising head of bed. Doing well with Ranitidine.

## 2016-08-01 NOTE — Assessment & Plan Note (Signed)
Well controlled, no changes to meds. Encouraged heart healthy diet such as the DASH diet and exercise as tolerated.  °

## 2016-08-01 NOTE — Assessment & Plan Note (Addendum)
Encouraged moist heat and gentle stretching as tolerated. May try NSAIDs and prescription meds as directed and report if symptoms worsen or seek immediate care. Try Lidocaine patches. Pain noted mostly on right posterior hip especially exercise. Call if worsens. Try Tizanidine prn qhs

## 2016-08-01 NOTE — Assessment & Plan Note (Addendum)
Worse with perimenopause. Getting up frequently each night and more and more has trboule going back to sleep between heat flashes and urinary frequency. Melatonin has not been helping as much recently. Has used Ambien in past but it lasts too long.

## 2016-08-01 NOTE — Patient Instructions (Addendum)
Lidocaine patches as needed Consider Chiropractor for hip  Insomnia Insomnia is a sleep disorder that makes it difficult to fall asleep or to stay asleep. Insomnia can cause tiredness (fatigue), low energy, difficulty concentrating, mood swings, and poor performance at work or school. There are three different ways to classify insomnia:  Difficulty falling asleep.  Difficulty staying asleep.  Waking up too early in the morning.  Any type of insomnia can be long-term (chronic) or short-term (acute). Both are common. Short-term insomnia usually lasts for three months or less. Chronic insomnia occurs at least three times a week for longer than three months. What are the causes? Insomnia may be caused by another condition, situation, or substance, such as:  Anxiety.  Certain medicines.  Gastroesophageal reflux disease (GERD) or other gastrointestinal conditions.  Asthma or other breathing conditions.  Restless legs syndrome, sleep apnea, or other sleep disorders.  Chronic pain.  Menopause. This may include hot flashes.  Stroke.  Abuse of alcohol, tobacco, or illegal drugs.  Depression.  Caffeine.  Neurological disorders, such as Alzheimer disease.  An overactive thyroid (hyperthyroidism).  The cause of insomnia may not be known. What increases the risk? Risk factors for insomnia include:  Gender. Women are more commonly affected than men.  Age. Insomnia is more common as you get older.  Stress. This may involve your professional or personal life.  Income. Insomnia is more common in people with lower income.  Lack of exercise.  Irregular work schedule or night shifts.  Traveling between different time zones.  What are the signs or symptoms? If you have insomnia, trouble falling asleep or trouble staying asleep is the main symptom. This may lead to other symptoms, such as:  Feeling fatigued.  Feeling nervous about going to sleep.  Not feeling rested in  the morning.  Having trouble concentrating.  Feeling irritable, anxious, or depressed.  How is this treated? Treatment for insomnia depends on the cause. If your insomnia is caused by an underlying condition, treatment will focus on addressing the condition. Treatment may also include:  Medicines to help you sleep.  Counseling or therapy.  Lifestyle adjustments.  Follow these instructions at home:  Take medicines only as directed by your health care provider.  Keep regular sleeping and waking hours. Avoid naps.  Keep a sleep diary to help you and your health care provider figure out what could be causing your insomnia. Include: ? When you sleep. ? When you wake up during the night. ? How well you sleep. ? How rested you feel the next day. ? Any side effects of medicines you are taking. ? What you eat and drink.  Make your bedroom a comfortable place where it is easy to fall asleep: ? Put up shades or special blackout curtains to block light from outside. ? Use a white noise machine to block noise. ? Keep the temperature cool.  Exercise regularly as directed by your health care provider. Avoid exercising right before bedtime.  Use relaxation techniques to manage stress. Ask your health care provider to suggest some techniques that may work well for you. These may include: ? Breathing exercises. ? Routines to release muscle tension. ? Visualizing peaceful scenes.  Cut back on alcohol, caffeinated beverages, and cigarettes, especially close to bedtime. These can disrupt your sleep.  Do not overeat or eat spicy foods right before bedtime. This can lead to digestive discomfort that can make it hard for you to sleep.  Limit screen use before bedtime. This includes: ?  Watching TV. ? Using your smartphone, tablet, and computer.  Stick to a routine. This can help you fall asleep faster. Try to do a quiet activity, brush your teeth, and go to bed at the same time each  night.  Get out of bed if you are still awake after 15 minutes of trying to sleep. Keep the lights down, but try reading or doing a quiet activity. When you feel sleepy, go back to bed.  Make sure that you drive carefully. Avoid driving if you feel very sleepy.  Keep all follow-up appointments as directed by your health care provider. This is important. Contact a health care provider if:  You are tired throughout the day or have trouble in your daily routine due to sleepiness.  You continue to have sleep problems or your sleep problems get worse. Get help right away if:  You have serious thoughts about hurting yourself or someone else. This information is not intended to replace advice given to you by your health care provider. Make sure you discuss any questions you have with your health care provider. Document Released: 01/21/2000 Document Revised: 06/25/2015 Document Reviewed: 10/24/2013 Elsevier Interactive Patient Education  [employer].
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
## 2016-08-03 DIAGNOSIS — H01021 Squamous blepharitis right upper eyelid: Secondary | ICD-10-CM | POA: Diagnosis not present

## 2016-08-03 DIAGNOSIS — H01025 Squamous blepharitis left lower eyelid: Secondary | ICD-10-CM | POA: Diagnosis not present

## 2016-08-03 DIAGNOSIS — H43811 Vitreous degeneration, right eye: Secondary | ICD-10-CM | POA: Diagnosis not present

## 2016-08-03 DIAGNOSIS — H01022 Squamous blepharitis right lower eyelid: Secondary | ICD-10-CM | POA: Diagnosis not present

## 2016-08-03 DIAGNOSIS — H2513 Age-related nuclear cataract, bilateral: Secondary | ICD-10-CM | POA: Diagnosis not present

## 2016-08-03 DIAGNOSIS — H01024 Squamous blepharitis left upper eyelid: Secondary | ICD-10-CM | POA: Diagnosis not present

## 2016-08-07 ENCOUNTER — Telehealth: Payer: Self-pay | Admitting: Family Medicine

## 2016-08-07 NOTE — Telephone Encounter (Signed)
Please advise    PC 

## 2016-08-07 NOTE — Telephone Encounter (Signed)
We should get a lumbar xray and refer her to sports med for further investigation unless it is worsening rapidly. We can use a muscle relaxer a couple times a day if she is significantly uncomfortabll

## 2016-08-07 NOTE — Telephone Encounter (Signed)
Caller name:Chris Owens Shark Relationship to patient: Can be reached:[phone] Pharmacy:  Reason for call:Patient states back pain is not getting any better, is have back run down her right leg. Please advise

## 2016-08-08 ENCOUNTER — Other Ambulatory Visit: Payer: Self-pay | Admitting: Family Medicine

## 2016-08-08 DIAGNOSIS — M545 Low back pain: Secondary | ICD-10-CM

## 2016-08-08 NOTE — Telephone Encounter (Signed)
Patient does not want medicine for now, only if it gets worse. Please order an xray and referral to sports medicine here at the [facility].

## 2016-08-08 NOTE — Telephone Encounter (Signed)
Called patient regarding phone call she made to [employer] about her mother. Patient changed conversation to her problem. Advised per Dr. Charlett Blake referral could be made to Sports Med, Muscle relaxer could be prescribed, or Lumbar X-ray could be ordered. Patient states she cannot take muscle relaxers because they do not help and she cannot take during the day. States she would consider XR and referral to Sports Med.

## 2016-08-08 NOTE — Telephone Encounter (Signed)
The other option is a steroid taper (medrol dose pak) if her pain is bad enough. Just confirm with her and I will place xray order and sports med referral. Would she like to be seen here, at [facility] or at [facility] for sports med? Will order the xray of lumbar spine here unless she prefers [facility].

## 2016-08-10 NOTE — Telephone Encounter (Signed)
Patient informed xray ordered/referral done.

## 2016-08-11 ENCOUNTER — Ambulatory Visit (HOSPITAL_BASED_OUTPATIENT_CLINIC_OR_DEPARTMENT_OTHER)
Admission: RE | Admit: 2016-08-11 | Discharge: 2016-08-11 | Disposition: A | Discharge status: Home / Self Care | Payer: Medicare Other | Source: Ambulatory Visit | Attending: Family Medicine | Admitting: Family Medicine

## 2016-08-11 ENCOUNTER — Encounter: Payer: Self-pay | Admitting: Family Medicine

## 2016-08-11 ENCOUNTER — Ambulatory Visit (INDEPENDENT_AMBULATORY_CARE_PROVIDER_SITE_OTHER): Payer: Medicare Other | Admitting: Family Medicine

## 2016-08-11 DIAGNOSIS — M545 Low back pain, unspecified: Secondary | ICD-10-CM

## 2016-08-11 DIAGNOSIS — M79604 Pain in right leg: Secondary | ICD-10-CM

## 2016-08-11 MED ORDER — PREDNISONE 10 MG PO TABS: ORAL_TABLET | ORAL | 0 refills | Status: DC

## 2016-08-11 NOTE — Patient Instructions (Addendum)
You have lumbar radiculopathy (a pinched nerve in your low back). Ok to take tylenol for baseline pain relief (1-2 extra strength tabs 3x/day) A prednisone dose pack is the best option for immediate relief and may be prescribed - take as directed Day after finishing prednisone you could take aleve 2 tabs twice a day with food for pain and inflammation. Consider Tramadol or norco as needed for severe pain (no driving on this medicine). Consider Flexeril as needed for muscle spasms (no driving on this medicine if it makes you sleepy). Stay as active as possible. Physical therapy has been shown to be helpful as well - start this in [facility]. Strengthening of low back muscles, abdominal musculature are key for long term pain relief. If not improving, will consider further imaging (MRI). Follow up with me in 5-6 weeks.

## 2016-08-14 DIAGNOSIS — M5416 Radiculopathy, lumbar region: Secondary | ICD-10-CM | POA: Diagnosis not present

## 2016-08-15 NOTE — Assessment & Plan Note (Signed)
consistent with lumbar radiculopathy.  Independently reviewed radiographs and no compression fracture.  Consistent with lumbar radiculopathy.  Discussed options - will start physical therapy, home exercises, prednisone dose pack.  F/u in 5-6 weeks.  Consider MRI if not improving.
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
## 2016-08-18 DIAGNOSIS — M5416 Radiculopathy, lumbar region: Secondary | ICD-10-CM | POA: Diagnosis not present

## 2016-08-23 DIAGNOSIS — M5416 Radiculopathy, lumbar region: Secondary | ICD-10-CM | POA: Diagnosis not present

## 2016-08-24 ENCOUNTER — Encounter: Payer: Self-pay | Admitting: Family Medicine

## 2016-08-25 DIAGNOSIS — M5416 Radiculopathy, lumbar region: Secondary | ICD-10-CM | POA: Diagnosis not present

## 2016-08-28 DIAGNOSIS — M5416 Radiculopathy, lumbar region: Secondary | ICD-10-CM | POA: Diagnosis not present

## 2016-08-29 DIAGNOSIS — M5416 Radiculopathy, lumbar region: Secondary | ICD-10-CM | POA: Diagnosis not present

## 2016-09-04 DIAGNOSIS — M5416 Radiculopathy, lumbar region: Secondary | ICD-10-CM | POA: Diagnosis not present

## 2016-09-07 DIAGNOSIS — M5416 Radiculopathy, lumbar region: Secondary | ICD-10-CM | POA: Diagnosis not present

## 2016-09-11 DIAGNOSIS — M5416 Radiculopathy, lumbar region: Secondary | ICD-10-CM | POA: Diagnosis not present

## 2016-09-13 DIAGNOSIS — M5416 Radiculopathy, lumbar region: Secondary | ICD-10-CM | POA: Diagnosis not present

## 2016-09-19 ENCOUNTER — Encounter: Payer: Self-pay | Admitting: Family Medicine

## 2016-09-19 ENCOUNTER — Ambulatory Visit (INDEPENDENT_AMBULATORY_CARE_PROVIDER_SITE_OTHER): Payer: Medicare Other | Admitting: Family Medicine

## 2016-09-19 DIAGNOSIS — M79604 Pain in right leg: Secondary | ICD-10-CM

## 2016-09-19 DIAGNOSIS — M545 Low back pain: Secondary | ICD-10-CM | POA: Diagnosis not present
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
## 2016-09-20 NOTE — Assessment & Plan Note (Signed)
2/2 radiculopathy.  Resolved at this point following HIP, physical therapy, prednisone.  Can take tylenol or aleve if needed for soreness.  Encouraged continued exercises most days of the week for 6 more weeks.  F/u prn.

## 2016-10-24 DIAGNOSIS — L821 Other seborrheic keratosis: Secondary | ICD-10-CM | POA: Diagnosis not present

## 2016-10-24 DIAGNOSIS — L570 Actinic keratosis: Secondary | ICD-10-CM | POA: Diagnosis not present

## 2016-10-24 DIAGNOSIS — D225 Melanocytic nevi of trunk: Secondary | ICD-10-CM | POA: Diagnosis not present

## 2016-10-24 DIAGNOSIS — Z85828 Personal history of other malignant neoplasm of skin: Secondary | ICD-10-CM | POA: Diagnosis not present

## 2016-11-14 ENCOUNTER — Encounter: Payer: Self-pay | Admitting: Family Medicine

## 2016-11-20 DIAGNOSIS — Z23 Encounter for immunization: Secondary | ICD-10-CM | POA: Diagnosis not present

## 2016-11-21 ENCOUNTER — Encounter: Payer: Self-pay | Admitting: Family Medicine

## 2016-11-22 MED ORDER — ALPRAZOLAM 0.25 MG PO TABS: 0.2500 mg | ORAL_TABLET | Freq: Two times a day (BID) | ORAL | 0 refills | Status: DC | PRN

## 2016-11-22 NOTE — Telephone Encounter (Signed)
Per Dr. Charlett Blake, okay to send/call alprazolam 0.25mg  1-2 tablets by mouth bid prn #10 and 0RF.   Rx phoned into [place], [place].

## 2016-11-22 NOTE — Telephone Encounter (Signed)
See note from 11/14/2016.

## 2016-12-15 ENCOUNTER — Other Ambulatory Visit: Payer: Self-pay | Admitting: Family Medicine

## 2017-01-04 ENCOUNTER — Encounter: Payer: Self-pay | Admitting: Family Medicine

## 2017-01-04 ENCOUNTER — Other Ambulatory Visit: Payer: Self-pay | Admitting: Family Medicine

## 2017-01-04 DIAGNOSIS — H2513 Age-related nuclear cataract, bilateral: Secondary | ICD-10-CM | POA: Diagnosis not present

## 2017-01-04 DIAGNOSIS — H43811 Vitreous degeneration, right eye: Secondary | ICD-10-CM | POA: Diagnosis not present

## 2017-01-04 DIAGNOSIS — H01025 Squamous blepharitis left lower eyelid: Secondary | ICD-10-CM | POA: Diagnosis not present

## 2017-01-04 DIAGNOSIS — H01022 Squamous blepharitis right lower eyelid: Secondary | ICD-10-CM | POA: Diagnosis not present

## 2017-01-04 DIAGNOSIS — H43812 Vitreous degeneration, left eye: Secondary | ICD-10-CM | POA: Diagnosis not present

## 2017-01-04 DIAGNOSIS — H01024 Squamous blepharitis left upper eyelid: Secondary | ICD-10-CM | POA: Diagnosis not present

## 2017-01-04 DIAGNOSIS — H01021 Squamous blepharitis right upper eyelid: Secondary | ICD-10-CM | POA: Diagnosis not present

## 2017-01-04 MED ORDER — CEFDINIR 300 MG PO CAPS: 300.0000 mg | ORAL_CAPSULE | Freq: Two times a day (BID) | ORAL | 0 refills | Status: AC

## 2017-01-22 NOTE — Progress Notes (Signed)
Subjective:   Carrie [name] is a 71 y.o. female who presents for Medicare Annual (Subsequent) preventive examination.  Review of Systems:  No ROS.  Medicare Wellness Visit. Additional risk factors are reflected in the social history.  Cardiac Risk Factors include: advanced age (>13men, >74 women);hypertension Sleep patterns: Falls to sleep easy. Difficulty staying asleep. 7-8 hrs broken sleep. Feels well rested. Home Safety/Smoke Alarms: Feels safe in home. Smoke alarms in place. Lives with husband.    Female:   Pap- Release of info forms sent to [facility]. Pt states she is UTD    Mammo- Release of info forms sent to [facility]. Pt states she is UTD    Dexa scan-  Release of info forms sent to [facility]. Pt states she is UTD    CCS- last 01/13/16: recall 10 yrs   Objective:     Vitals: BP 118/68 (BP Location: Left Arm, Patient Position: Sitting, Cuff Size: Normal)   Pulse (!) 59   Temp 98 F (36.7 C) (Oral)   Resp 18   Wt 129 lb 3.2 oz (58.6 kg)   SpO2 98%   BMI 20.85 kg/m   Body mass index is 20.85 kg/m.  Advanced Directives 01/23/2017 01/21/2016  Does Patient Have a Medical Advance Directive? Yes;No Yes  Type of Paramedic of [place];Living will [place];Living will  Copy of [place] in Chart? No - copy requested No - copy requested    Tobacco Social History   Tobacco Use  Smoking Status Former Smoker  . Packs/day: 0.25  . Last attempt to quit: 02/07/1968  . Years since quitting: 48.9  Smokeless Tobacco Never Used     Counseling given: Not Answered   Clinical Intake: Pain : No/denies pain   Past Medical History:  Diagnosis Date  . Breast cancer ([place]) 08/2004   Left breast invasive ductal carcinoma  . CHICKENPOX, HX OF 03/03/2010  . COMMON MIGRAINE 03/03/2010  . DEGENERATIVE DISC DISEASE 03/03/2010  . Essential hypertension, benign 03/03/2010  . Foot pain,  bilateral 02/08/2016  . HYPOKALEMIA 03/03/2010  . Increased vitamin B12 level 01/10/2015  . Insomnia 08/01/2016  . Low back pain 08/01/2016  . MEASLES, HX OF 03/03/2010  . Melanoma of skin, site unspecified 03/03/2010  . 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 NEOPLASM, MALIGNANT, BREAST, HX OF 03/03/2010  . OSTEOPENIA 03/03/2010  . PALPITATIONS, HX OF 03/03/2010  . Pulmonary nodules 01/07/2015  . Rib fracture    left, 3 fractures s/p radiation: right side 2 fractured, no surgery   Past Surgical History:  Procedure Laterality Date  . BREAST LUMPECTOMY WITH NEEDLE LOCALIZATION AND AXILLARY SENTINEL LYMPH NODE BX Left 08/2004  . clavicle dislocate  71 yrs old   right, reapproximated w/pins, subsequently removed  . HERNIA REPAIR  05/2012  . left hip traumatic history     hematoma surgically evacuated  . SHOULDER ARTHROSCOPY     b/l shoulder with debridement  . TONSILLECTOMY     Family History  Problem Relation Age of Onset  . Hypertension Mother   . COPD Mother   . Alcohol abuse Father        history of  . Coronary artery disease Father   . Osteoarthritis Brother        knees  . Basal cell carcinoma Sister   . Heart disease Maternal Grandmother   . Stroke Maternal Grandfather   . Osteoarthritis Sister  knees  . Migraines Sister    Social History   Socioeconomic History  . Marital status: Married    Spouse name: None  . Number of children: None  . Years of education: None  . Highest education level: None  Social Needs  . Financial resource strain: None  . Food insecurity - worry: None  . Food insecurity - inability: None  . Transportation needs - medical: None  . Transportation needs - non-medical: None  Occupational History  . None  Tobacco Use  . Smoking status: Former Smoker    Packs/day: 0.25    Last attempt to quit: 02/07/1968    Years since quitting: 48.9  . Smokeless tobacco: Never Used  Substance and Sexual Activity  . Alcohol use: Yes    Comment: 1-2 glasses of  wine per day  . Drug use: No  . 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 None  Social History Narrative  . None    Outpatient Encounter Medications as of 01/23/2017  Medication Sig  . Ascorbic Acid (VITAMIN C) 1000 MG tablet Take 1,000 mg by mouth daily.  Marland Kitchen atenolol (TENORMIN) 25 MG tablet TAKE ONE HALF TABLETS (12.5 MG TOTAL) BY MOUTH DAILY.  . Calcium-Vitamin D-Vitamin K (CALCIUM SOFT CHEWS PO) Take 1 tablet by mouth 2 (two) times daily.  . Cholecalciferol (VITAMIN D3) 1000 UNITS CAPS Take 1 capsule by mouth daily.  Marland Kitchen glucosamine-chondroitin 500-400 MG tablet Take 1 tablet by mouth daily.    . Omega-3 Fatty Acids (FISH OIL) 1200 MG CAPS Take 1,200 mg by mouth 2 (two) times daily.   . ranitidine (ZANTAC) 300 MG tablet Take 0.5 tablets (150 mg total) by mouth daily.  . Vitamins-Lipotropics (B-50) TABS Take 1 tablet by mouth daily.   . [DISCONTINUED] ALPRAZolam (XANAX) 0.25 MG tablet Take 1-2 tablets (0.25-0.5 mg total) by mouth 2 (two) times daily as needed for anxiety or sleep (flights).  . [DISCONTINUED] predniSONE (DELTASONE) 10 MG tablet 6 tabs po day 1, 5 tabs po day 2, 4 tabs po day 3, 3 tabs po day 4, 2 tabs po day 5, 1 tab po day 6  . [DISCONTINUED] tiZANidine (ZANAFLEX) 4 MG tablet Take 0.5-1 tablets (2-4 mg total) by mouth at bedtime as needed for muscle spasms.   No facility-administered encounter medications on file as of 01/23/2017.     Activities of Daily Living In your present state of health, do you have any difficulty performing the following activities: 01/23/2017  Hearing? N  Vision? N  Comment wears contacts. Dr.Byrnes yearly.  Difficulty concentrating or making decisions? N  Walking or climbing stairs? N  Dressing or bathing? N  Doing errands, shopping? N  Preparing Food and eating ? N  Using the Toilet? N  In the past six months,  have you accidently leaked urine? N  Do you have problems with loss of bowel control? N  Managing your Medications? N  Managing your Finances? N  Housekeeping or managing your Housekeeping? N  Some recent data might be hidden    Patient Care Team: [name], MD as PCP - General (Family Medicine) Justice Britain, MD as Consulting Physician (Orthopedic Surgery) Danella Sensing, MD as Consulting Physician (Dermatology) Richmond Campbell, MD as Consulting Physician (Gastroenterology) Fay Records, MD as Consulting Physician (Cardiology) Linda Hedges, DO as Consulting Physician (Obstetrics and Gynecology)    Assessment:  This is a routine wellness examination for [place]. Physical assessment deferred to PCP.  Exercise Activities and Dietary recommendations Current Exercise Habits: Structured exercise class, Type of exercise: strength training/weights;treadmill, Time (Minutes): 45, Frequency (Times/Week): 5, Weekly Exercise (Minutes/Week): 225, Intensity: Moderate Diet (meal preparation, eat out, water intake, caffeinated beverages, dairy products, fruits and vegetables): in general, a "healthy" diet    Goals    . Maintain current health (pt-stated)       Fall Risk Fall Risk  01/23/2017 01/21/2016 07/13/2015 07/03/2014  Falls in the past year? No No No No    Depression Screen PHQ 2/9 Scores 01/23/2017 01/21/2016 07/13/2015 07/03/2014  PHQ - 2 Score 0 0 0 0     Cognitive Function MMSE - Mini Mental State Exam 01/21/2016  Orientation to time 5  Orientation to Place 5  Registration 3  Attention/ Calculation 5  Recall 3  Language- name 2 objects 2  Language- repeat 1  Language- follow 3 step command 3  Language- read & follow direction 1  Write a sentence 1  Copy design 1  Total score 30        Immunization History  Administered Date(s) Administered  . Influenza Whole 10/07/2009  . Influenza,inj,Quad PF,6+ Mos 01/07/2015  . Influenza-Unspecified 11/07/2015  .  Pneumococcal Conjugate-13 12/16/2013  . Tdap 06/21/2011   Screening Tests Health Maintenance  Topic Date Due  . PNA vac Low Risk Adult (2 of 2 - PPSV23) 12/17/2014  . MAMMOGRAM  02/19/2015  . INFLUENZA VACCINE  09/06/2016  . TETANUS/TDAP  06/20/2021  . COLONOSCOPY  01/12/2026  . DEXA SCAN  Completed  . Hepatitis C Screening  Completed       Plan:    Follow up with PCP as directed  Continue to eat heart healthy diet (full of fruits, vegetables, whole grains, lean protein, water--limit salt, fat, and sugar intake) and increase physical activity as tolerated.  Continue doing brain stimulating activities (puzzles, reading, adult coloring books, staying active) to keep memory sharp.   Bring a copy of your living will and/or healthcare power of attorney to your next office visit.   I have personally reviewed and noted the following in the patient's chart:   . Medical and social history . Use of alcohol, tobacco or illicit drugs  . Current medications and supplements . Functional ability and status . Nutritional status . Physical activity . Advanced directives . List of other physicians . Hospitalizations, surgeries, and ER visits in previous 12 months . Vitals . Screenings to include cognitive, depression, and falls . Referrals and appointments  In addition, I have reviewed and discussed with patient certain preventive protocols, quality metrics, and best practice recommendations. A written personalized care plan for preventive services as well as general preventive health recommendations were provided to patient.     Shela Nevin, [place]  01/23/2017

## 2017-01-23 ENCOUNTER — Encounter: Payer: Self-pay | Admitting: Family Medicine

## 2017-01-23 ENCOUNTER — Ambulatory Visit (INDEPENDENT_AMBULATORY_CARE_PROVIDER_SITE_OTHER): Payer: Medicare Other | Admitting: Family Medicine

## 2017-01-23 VITALS — BP 118/68 | HR 59 | Temp 98.0°F | Resp 18 | Wt 129.2 lb

## 2017-01-23 DIAGNOSIS — E538 Deficiency of other specified B group vitamins: Secondary | ICD-10-CM

## 2017-01-23 DIAGNOSIS — I10 Essential (primary) hypertension: Secondary | ICD-10-CM

## 2017-01-23 DIAGNOSIS — Z0000 Encounter for general adult medical examination without abnormal findings: Secondary | ICD-10-CM | POA: Diagnosis not present

## 2017-01-23 DIAGNOSIS — R748 Abnormal levels of other serum enzymes: Secondary | ICD-10-CM

## 2017-01-23 DIAGNOSIS — G4700 Insomnia, unspecified: Secondary | ICD-10-CM | POA: Diagnosis not present

## 2017-01-23 DIAGNOSIS — R7989 Other specified abnormal findings of blood chemistry: Secondary | ICD-10-CM

## 2017-01-23 NOTE — Patient Instructions (Addendum)
kegel exercises twice daily, sets of 10 Bring [place] the Advanced Directives when able  Preventive Care 65 Years and Older, Female Preventive care refers to lifestyle choices and visits with your health care provider that can promote health and wellness. What does preventive care include?  A yearly physical exam. This is also called an annual well check.  Dental exams once or twice a year.  Routine eye exams. Ask your health care provider how often you should have your eyes checked.  Personal lifestyle choices, including: ? Daily care of your teeth and gums. ? Regular physical activity. ? Eating a healthy diet. ? Avoiding tobacco and drug use. ? Limiting alcohol use. ? Practicing safe sex. ? Taking low-dose aspirin every day. ? Taking vitamin and mineral supplements as recommended by your health care provider. What happens during an annual well check? The services and screenings done by your health care provider during your annual well check will depend on your age, overall health, lifestyle risk factors, and family history of disease. Counseling Your health care provider may ask you questions about your:  Alcohol use.  Tobacco use.  Drug use.  Emotional well-being.  Home and relationship well-being.  Sexual activity.  Eating habits.  History of falls.  Memory and ability to understand (cognition).  Work and work Statistician.  Reproductive health.  Screening You may have the following tests or measurements:  Height, weight, and BMI.  Blood pressure.  Lipid and cholesterol levels. These may be checked every 5 years, or more frequently if you are over 14 years old.  Skin check.  Lung cancer screening. You may have this screening every year starting at age 27 if you have a 30-pack-year history of smoking and currently smoke or have quit within the past 15 years.  Fecal occult blood test (FOBT) of the stool. You may have this test every year starting at age  56.  Flexible sigmoidoscopy or colonoscopy. You may have a sigmoidoscopy every 5 years or a colonoscopy every 10 years starting at age 29.  Hepatitis C blood test.  Hepatitis B blood test.  Sexually transmitted disease (STD) testing.  Diabetes screening. This is done by checking your blood sugar (glucose) after you have not eaten for a while (fasting). You may have this done every 1-3 years.  Bone density scan. This is done to screen for osteoporosis. You may have this done starting at age 13.  Mammogram. This may be done every 1-2 years. Talk to your health care provider about how often you should have regular mammograms.  Talk with your health care provider about your test results, treatment options, and if necessary, the need for more tests. Vaccines Your health care provider may recommend certain vaccines, such as:  Influenza vaccine. This is recommended every year.  Tetanus, diphtheria, and acellular pertussis (Tdap, Td) vaccine. You may need a Td booster every 10 years.  Varicella vaccine. You may need this if you have not been vaccinated.  Zoster vaccine. You may need this after age 69.  Measles, mumps, and rubella (MMR) vaccine. You may need at least one dose of MMR if you were born in 1957 or later. You may also need a second dose.  Pneumococcal 13-valent conjugate (PCV13) vaccine. One dose is recommended after age 37.  Pneumococcal polysaccharide (PPSV23) vaccine. One dose is recommended after age 89.  Meningococcal vaccine. You may need this if you have certain conditions.  Hepatitis A vaccine. You may need this if you have certain conditions  or if you travel or work in places where you may be exposed to hepatitis A.  Hepatitis B vaccine. You may need this if you have certain conditions or if you travel or work in places where you may be exposed to hepatitis B.  Haemophilus influenzae type b (Hib) vaccine. You may need this if you have certain conditions.  Talk to  your health care provider about which screenings and vaccines you need and how often you need them. This information is not intended to replace advice given to you by your health care provider. Make sure you discuss any questions you have with your health care provider. Document Released: 02/19/2015 Document Revised: 10/13/2015 Document Reviewed: 11/24/2014 Elsevier Interactive Patient Education  2017 [employer].

## 2017-01-24 ENCOUNTER — Encounter: Payer: Self-pay | Admitting: Family Medicine

## 2017-01-24 DIAGNOSIS — R748 Abnormal levels of other serum enzymes: Secondary | ICD-10-CM | POA: Insufficient documentation

## 2017-01-24 LAB — COMPREHENSIVE METABOLIC PANEL
ALK PHOS: 152 U/L — AB (ref 39–117)
ALT: 10 U/L (ref 0–35)
AST: 15 U/L (ref 0–37)
Albumin: 3.9 g/dL (ref 3.5–5.2)
BUN: 16 mg/dL (ref 6–23)
CHLORIDE: 104 meq/L (ref 96–112)
CO2: 29 mEq/L (ref 19–32)
Calcium: 8.7 mg/dL (ref 8.4–10.5)
Creatinine, Ser: 0.55 mg/dL (ref 0.40–1.20)
GFR: 116.35 mL/min (ref >60.00)
GLUCOSE: 90 mg/dL (ref 70–99)
POTASSIUM: 3.6 meq/L (ref 3.5–5.1)
Sodium: 140 mEq/L (ref 135–145)
TOTAL PROTEIN: 6.3 g/dL (ref 6.0–8.3)
Total Bilirubin: 0.4 mg/dL (ref 0.2–1.2)

## 2017-01-24 LAB — CBC
HEMATOCRIT: 36 % (ref 36.0–46.0)
HEMOGLOBIN: 12 g/dL (ref 12.0–15.0)
MCHC: 33.3 g/dL (ref 30.0–36.0)
MCV: 93.8 fl (ref 78.0–100.0)
Platelets: 167 10*3/uL (ref 150.0–400.0)
RBC: 3.84 Mil/uL — ABNORMAL LOW (ref 3.87–5.11)
RDW: 17.8 % — AB (ref 11.5–15.5)
WBC: 4.2 10*3/uL (ref 4.0–10.5)

## 2017-01-24 LAB — VITAMIN B12: Vitamin B-12: 547 pg/mL (ref 211–911)

## 2017-01-24 LAB — TSH: TSH: 0.78 u[IU]/mL (ref 0.35–4.50)

## 2017-01-24 NOTE — Assessment & Plan Note (Addendum)
Well controlled, no changes to meds. Encouraged heart healthy diet such as the DASH diet and exercise as tolerated. Check CMP, TSH and CBC

## 2017-01-24 NOTE — Progress Notes (Addendum)
Patient ID: Carrie [name], female   DOB: 01-Dec-1945, 71 y.o.   MRN: [id number]   Subjective:    Patient ID: Carrie [name], female    DOB: 01/17/1946, 71 y.o.   MRN: [id number]  Chief Complaint  Patient presents with  . Medicare Wellness    with RN    HPI Patient is in today for Medicare wellness exam with RN and follow-up on chronic medical conditions.  She has just sprains the loss of her mother at age 67.  The death was with hospice and very peaceful.  The patient herself is sad but at peace.  She herself has had no recent febrile illness or acute hospitalizations.  Overall she feels well.  She acknowledges not exercising during her mother's active dying process.  He generally exercises regularly and eats a heart healthy diet.  Hydrates well most of the time. Denies CP/palp/SOB/HA/congestion/fevers/GI or GU c/o. Taking meds as prescribed  Past Medical History:  Diagnosis Date  . Breast cancer ([place]) 08/2004   Left breast invasive ductal carcinoma  . CHICKENPOX, HX OF 03/03/2010  . COMMON MIGRAINE 03/03/2010  . DEGENERATIVE DISC DISEASE 03/03/2010  . Essential hypertension, benign 03/03/2010  . Foot pain, bilateral 02/08/2016  . HYPOKALEMIA 03/03/2010  . Increased vitamin B12 level 01/10/2015  . Insomnia 08/01/2016  . Low back pain 08/01/2016  . MEASLES, HX OF 03/03/2010  . Melanoma of skin, site unspecified 03/03/2010  . 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 NEOPLASM, MALIGNANT, BREAST, HX OF 03/03/2010  . OSTEOPENIA 03/03/2010  . PALPITATIONS, HX OF 03/03/2010  . Pulmonary nodules 01/07/2015  . Rib fracture    left, 3 fractures s/p radiation: right side 2 fractured, no surgery    Past Surgical History:  Procedure Laterality Date  . BREAST LUMPECTOMY WITH NEEDLE LOCALIZATION AND AXILLARY SENTINEL LYMPH NODE BX Left 08/2004  . clavicle dislocate  71 yrs old   right, reapproximated w/pins, subsequently removed  . HERNIA REPAIR  05/2012  . left hip traumatic history     hematoma surgically  evacuated  . SHOULDER ARTHROSCOPY     b/l shoulder with debridement  . TONSILLECTOMY      Family History  Problem Relation Age of Onset  . Hypertension Mother   . COPD Mother   . Alcohol abuse Father        history of  . Coronary artery disease Father   . Osteoarthritis Brother        knees  . Basal cell carcinoma Sister   . Heart disease Maternal Grandmother   . Stroke Maternal Grandfather   . Osteoarthritis Sister        knees  . Migraines Sister     Social History   Socioeconomic History  . Marital status: Married    Spouse name: Not on file  . Number of children: Not on file  . Years of education: Not on file  . Highest education level: Not on file  Social Needs  . Financial resource strain: Not on file  . Food insecurity - worry: Not on file  . Food insecurity - inability: Not on file  . Transportation needs - medical: Not on file  . Transportation needs - non-medical: Not on file  Occupational History  . Not on file  Tobacco Use  . Smoking status: Former Smoker    Packs/day: 0.25    Last attempt to quit: 02/07/1968    Years since quitting: 49.0  . Smokeless tobacco: Never Used  Substance and Sexual Activity  .  Alcohol use: Yes    Comment: 1-2 glasses of wine per day  . Drug use: No  . 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 Not on file  Social History Narrative  . Not on file    Outpatient Medications Prior to Visit  Medication Sig Dispense Refill  . Ascorbic Acid (VITAMIN C) 1000 MG tablet Take 1,000 mg by mouth daily.    Marland Kitchen atenolol (TENORMIN) 25 MG tablet TAKE ONE HALF TABLETS (12.5 MG TOTAL) BY MOUTH DAILY. 90 tablet 0  . Calcium-Vitamin D-Vitamin K (CALCIUM SOFT CHEWS PO) Take 1 tablet by mouth 2 (two) times daily.    . Cholecalciferol (VITAMIN D3) 1000 UNITS CAPS Take 1 capsule by mouth daily.    Marland Kitchen  glucosamine-chondroitin 500-400 MG tablet Take 1 tablet by mouth daily.      . Omega-3 Fatty Acids (FISH OIL) 1200 MG CAPS Take 1,200 mg by mouth 2 (two) times daily.     . ranitidine (ZANTAC) 300 MG tablet Take 0.5 tablets (150 mg total) by mouth daily. 90 tablet 1  . Vitamins-Lipotropics (B-50) TABS Take 1 tablet by mouth daily.     Marland Kitchen ALPRAZolam (XANAX) 0.25 MG tablet Take 1-2 tablets (0.25-0.5 mg total) by mouth 2 (two) times daily as needed for anxiety or sleep (flights). 10 tablet 0  . predniSONE (DELTASONE) 10 MG tablet 6 tabs po day 1, 5 tabs po day 2, 4 tabs po day 3, 3 tabs po day 4, 2 tabs po day 5, 1 tab po day 6 21 tablet 0  . tiZANidine (ZANAFLEX) 4 MG tablet Take 0.5-1 tablets (2-4 mg total) by mouth at bedtime as needed for muscle spasms. 30 tablet 2   No facility-administered medications prior to visit.     Allergies  Allergen Reactions  . Hydrocodone-Acetaminophen Other (See Comments)    Slows Respiration Rate Down  . Codeine     REACTION: upsets stomach  . Demerol     hives  . Hydrocodone-Acetaminophen     REACTION: slows respiratory rate down  . Meperidine Hcl     REACTION: Hives    Review of Systems  Constitutional: Negative for fever.  HENT: Negative for congestion.   Eyes: Negative for blurred vision.  Respiratory: Negative for cough.   Cardiovascular: Negative for chest pain and palpitations.  Gastrointestinal: Negative for vomiting.  Musculoskeletal: Negative for back pain.  Skin: Negative for rash.  Neurological: Negative for loss of consciousness and headaches.       Objective:    Physical Exam  Constitutional: She is oriented to person, place, and time. She appears well-developed and well-nourished. No distress.  HENT:  Head: Normocephalic and atraumatic.  Eyes: Conjunctivae are normal.  Neck: Neck supple. No thyromegaly present.  Cardiovascular: Normal rate, regular rhythm and normal heart sounds.  No murmur heard. Pulmonary/Chest: Effort  normal and breath sounds normal. No respiratory distress.  Abdominal: Soft. Bowel sounds are normal. She exhibits no distension and no mass. There is no tenderness.  Musculoskeletal: She exhibits no edema.  Lymphadenopathy:    She has no cervical adenopathy.  Neurological: She is alert and oriented to person, place, and time.  Skin: Skin is warm and dry.  Psychiatric: She has a normal mood and affect. Her behavior is normal.    BP 118/68 (BP Location: Left Arm, Patient Position: Sitting, Cuff Size: Normal)   Pulse Marland Kitchen)  59   Temp 98 F (36.7 C) (Oral)   Resp 18   Wt 129 lb 3.2 oz (58.6 kg)   SpO2 98%   BMI 20.85 kg/m  Wt Readings from Last 3 Encounters:  01/23/17 129 lb 3.2 oz (58.6 kg)  09/19/16 124 lb 6.4 oz (56.4 kg)  08/11/16 125 lb (56.7 kg)     Lab Results  Component Value Date   WBC 4.2 01/23/2017   HGB 12.0 01/23/2017   HCT 36.0 01/23/2017   PLT 167.0 01/23/2017   GLUCOSE 90 01/23/2017   CHOL 200 (H) 01/21/2016   TRIG 79 01/21/2016   HDL 101 01/21/2016   LDLDIRECT 94.8 07/28/2010   LDLCALC 83 01/21/2016   ALT 10 01/23/2017   AST 15 01/23/2017   NA 140 01/23/2017   K 3.6 01/23/2017   CL 104 01/23/2017   CREATININE 0.55 01/23/2017   BUN 16 01/23/2017   CO2 29 01/23/2017   TSH 0.78 01/23/2017    Lab Results  Component Value Date   TSH 0.78 01/23/2017   Lab Results  Component Value Date   WBC 4.2 01/23/2017   HGB 12.0 01/23/2017   HCT 36.0 01/23/2017   MCV 93.8 01/23/2017   PLT 167.0 01/23/2017   Lab Results  Component Value Date   NA 140 01/23/2017   K 3.6 01/23/2017   CO2 29 01/23/2017   GLUCOSE 90 01/23/2017   BUN 16 01/23/2017   CREATININE 0.55 01/23/2017   BILITOT 0.4 01/23/2017   ALKPHOS 152 (H) 01/23/2017   AST 15 01/23/2017   ALT 10 01/23/2017   PROT 6.3 01/23/2017   ALBUMIN 3.9 01/23/2017   CALCIUM 8.7 01/23/2017   GFR 116.35 01/23/2017   Lab Results  Component Value Date   CHOL 200 (H) 01/21/2016   Lab Results  Component  Value Date   HDL 101 01/21/2016   Lab Results  Component Value Date   LDLCALC 83 01/21/2016   Lab Results  Component Value Date   TRIG 79 01/21/2016   Lab Results  Component Value Date   CHOLHDL 2.0 01/21/2016   No results found for: HGBA1C     Assessment & Plan:   Problem List Items Addressed This Visit    ESSENTIAL HYPERTENSION, BENIGN    Well controlled, no changes to meds. Encouraged heart healthy diet such as the DASH diet and exercise as tolerated. Check CMP, TSH and CBC       Preventative health care - Primary    Patient encouraged to maintain heart healthy diet, regular exercise, adequate sleep. Consider daily probiotics. Take medications as prescribed      Relevant Orders   CBC (Completed)   Comprehensive metabolic panel (Completed)   TSH (Completed)   Increased vitamin B12 level    Normal on current regimen no changes      Insomnia    Encouraged good sleep hygiene such as dark, quiet room. No blue/green glowing lights such as computer screens in bedroom. No alcohol or stimulants in evening. Cut down on caffeine as able. Regular exercise is helpful but not just prior to bed time. May use alprazolam prn sparingly      Elevated alkaline phosphatase level    New, asymptomatic. Will repeat next month to further evaluate       Other Visit Diagnoses    Vitamin B 12 deficiency       Relevant Orders   Vitamin B12 (Completed)   Encounter for Medicare annual wellness exam  I have discontinued Crosby A. Dapolito's tiZANidine, predniSONE, and ALPRAZolam. I am also having her maintain her B-50, Fish Oil, glucosamine-chondroitin, Calcium-Vitamin D-Vitamin K (CALCIUM SOFT CHEWS PO), Vitamin D3, vitamin C, ranitidine, and atenolol.  No orders of the defined types were placed in this encounter.   Penni Homans, MD

## 2017-01-24 NOTE — Assessment & Plan Note (Signed)
Encouraged good sleep hygiene such as dark, quiet room. No blue/green glowing lights such as computer screens in bedroom. No alcohol or stimulants in evening. Cut down on caffeine as able. Regular exercise is helpful but not just prior to bed time. May use alprazolam prn sparingly

## 2017-01-24 NOTE — Assessment & Plan Note (Signed)
Patient encouraged to maintain heart healthy diet, regular exercise, adequate sleep. Consider daily probiotics. Take medications as prescribed 

## 2017-01-24 NOTE — Assessment & Plan Note (Signed)
New, asymptomatic. Will repeat next month to further evaluate

## 2017-01-24 NOTE — Assessment & Plan Note (Signed)
Normal on current regimen no changes

## 2017-02-07 DIAGNOSIS — C44722 Squamous cell carcinoma of skin of right lower limb, including hip: Secondary | ICD-10-CM | POA: Diagnosis not present

## 2017-02-07 DIAGNOSIS — D485 Neoplasm of uncertain behavior of skin: Secondary | ICD-10-CM | POA: Diagnosis not present

## 2017-02-07 DIAGNOSIS — L820 Inflamed seborrheic keratosis: Secondary | ICD-10-CM | POA: Diagnosis not present

## 2017-02-07 DIAGNOSIS — Z85828 Personal history of other malignant neoplasm of skin: Secondary | ICD-10-CM | POA: Diagnosis not present

## 2017-02-07 DIAGNOSIS — L821 Other seborrheic keratosis: Secondary | ICD-10-CM | POA: Diagnosis not present

## 2017-02-09 ENCOUNTER — Telehealth: Payer: Self-pay | Admitting: *Deleted

## 2017-02-09 NOTE — Telephone Encounter (Signed)
Received Medical records from  [facility]; forwarded to provider/SLS 01/04

## 2017-02-14 IMAGING — CT CT NECK W/ CM
3 of 4 series · 13 of 33 positions shown, 16 images · IV contrast (iopamidol)
Comparison: Soft tissue ultrasound 05/17/2015.

CLINICAL DATA: Palpable nodule posterior to the left ear.

EXAM:
CT NECK WITH CONTRAST
TECHNIQUE: Multidetector CT imaging of the neck was performed using the
standard protocol following the bolus administration of intravenous
contrast.
CONTRAST:  75mL I5RGF9-HHH IOPAMIDOL (I5RGF9-HHH) INJECTION 61%

[Series 2: axial neck · axial · 0.51mm/px · z∈[+822,+978]mm · 5 of 116 slices shown, 7 images]
[im 20/116  soft-tissue]
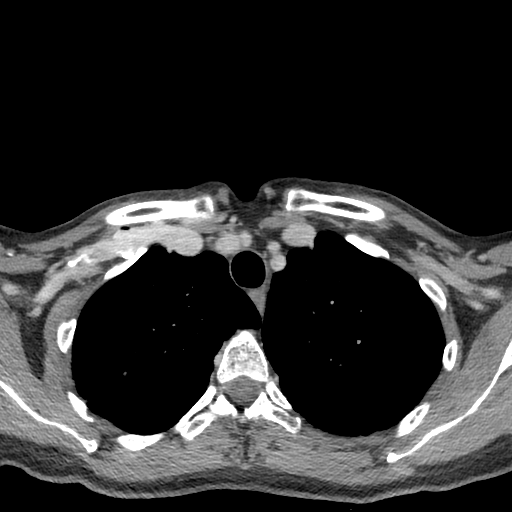
[im 20/116  bone]
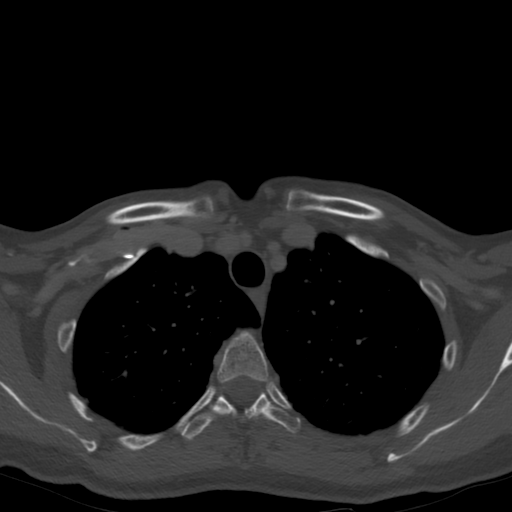
[im 39/116  bone]
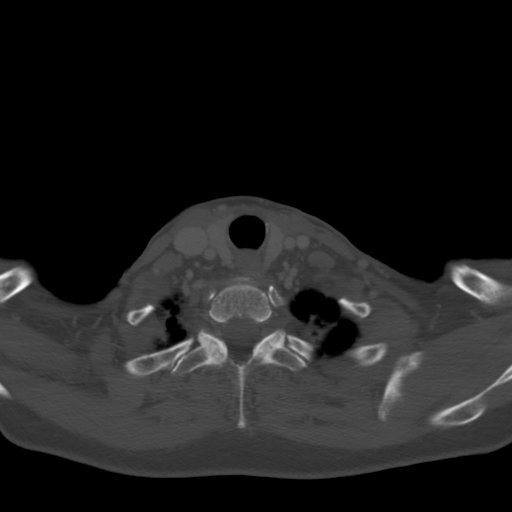
[im 58/116  bone]
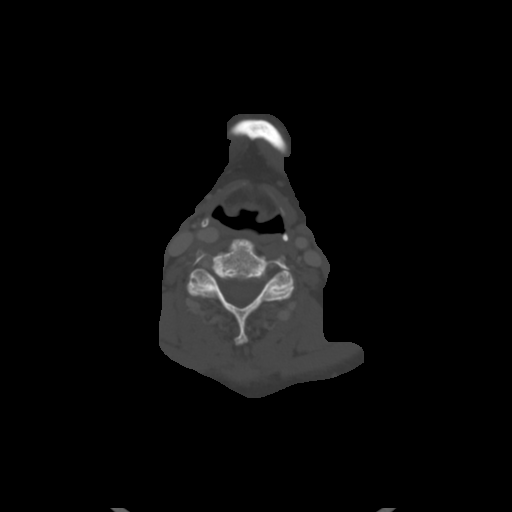
[im 77/116  bone]
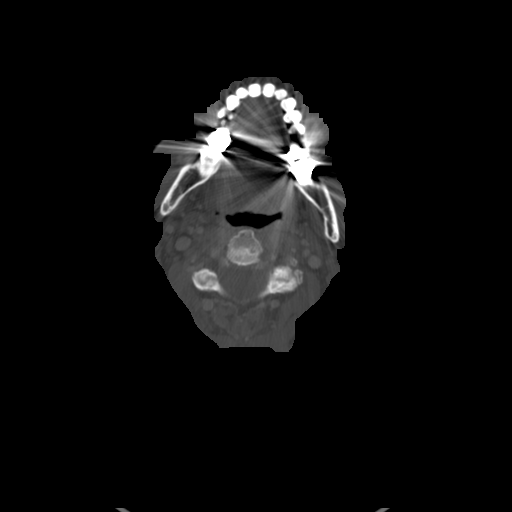
[im 96/116  soft-tissue]
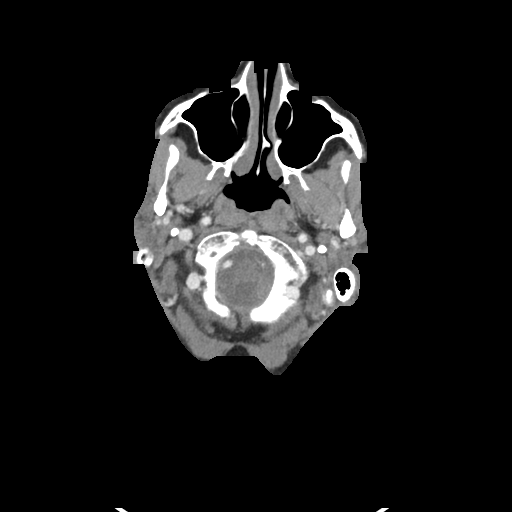
[im 96/116  bone]
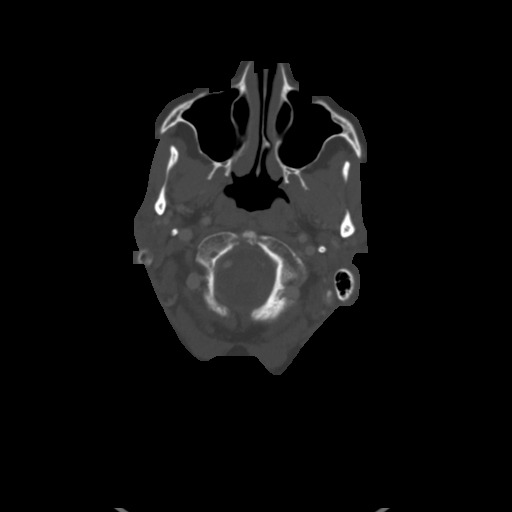

[Series 4: sag neck · sagittal · 0.49mm/px · 5 of 88 slices shown, 6 images]
[im 30/88  bone]
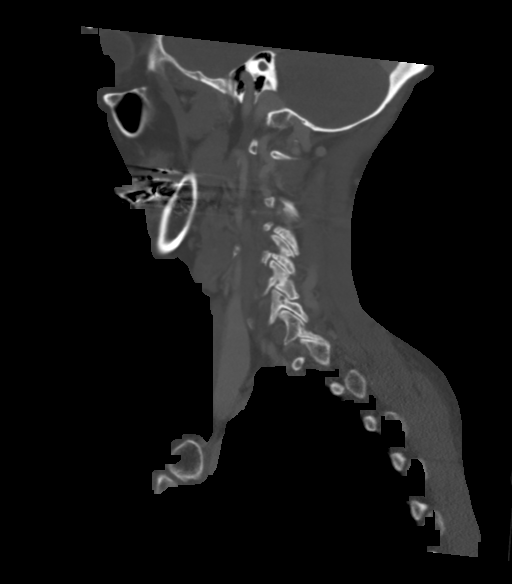
[im 37/88  bone]
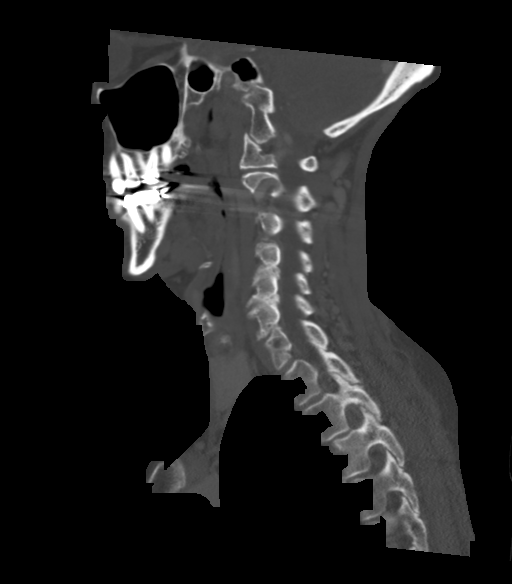
[im 44/88  soft-tissue]
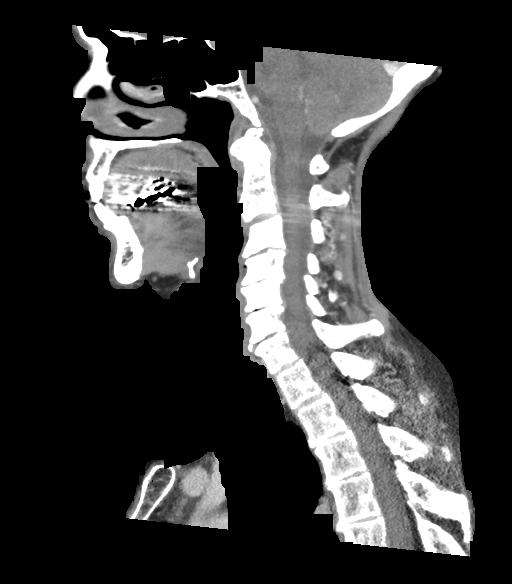
[im 44/88  bone]
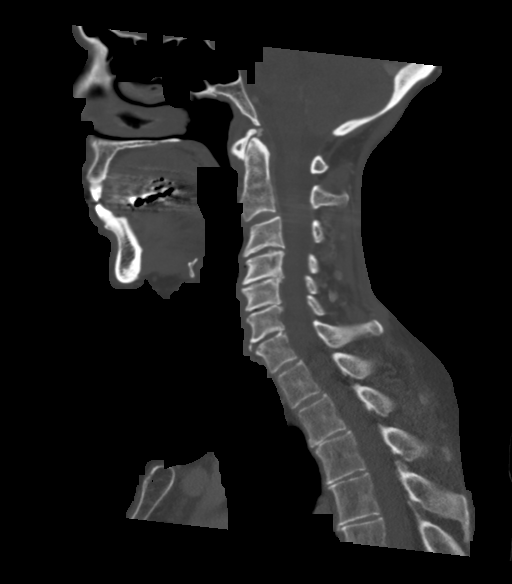
[im 51/88  bone]
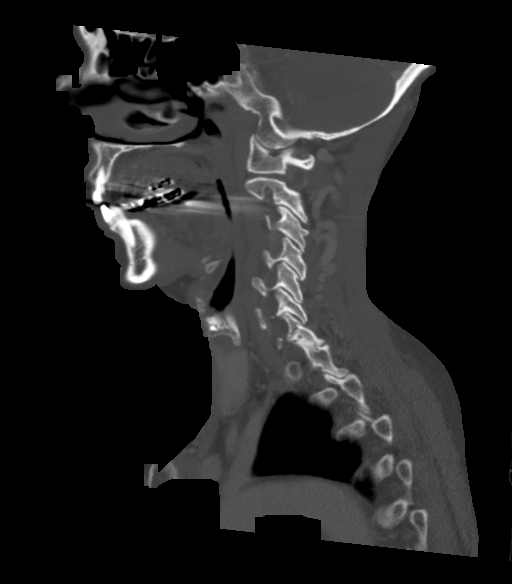
[im 59/88  bone]
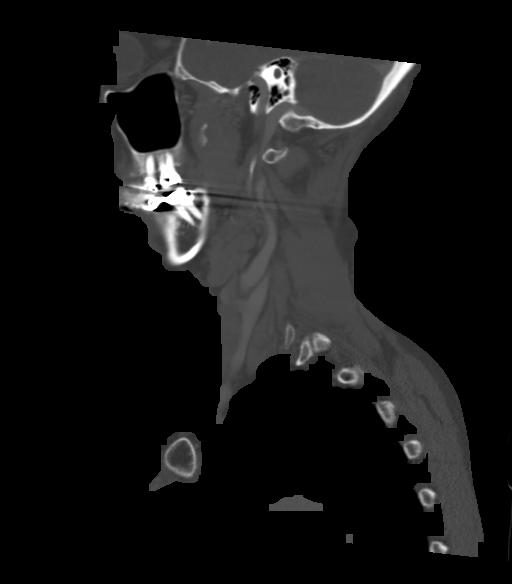

[Series 5: cor neck · coronal · 0.39mm/px · 3 of 131 slices shown]
[im 27/131  bone]
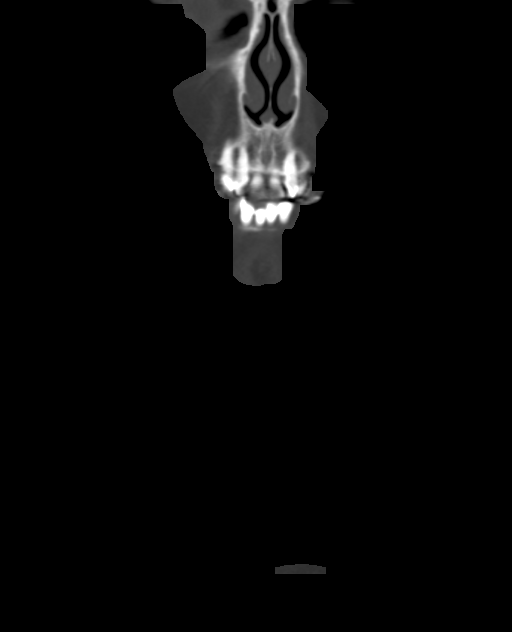
[im 53/131  bone]
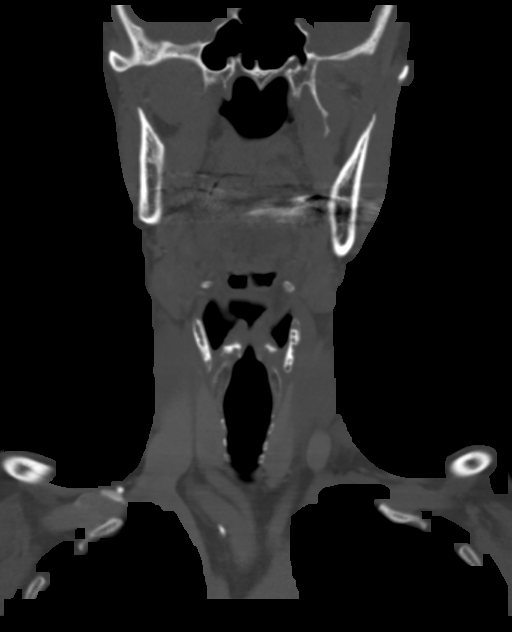
[im 79/131  bone]
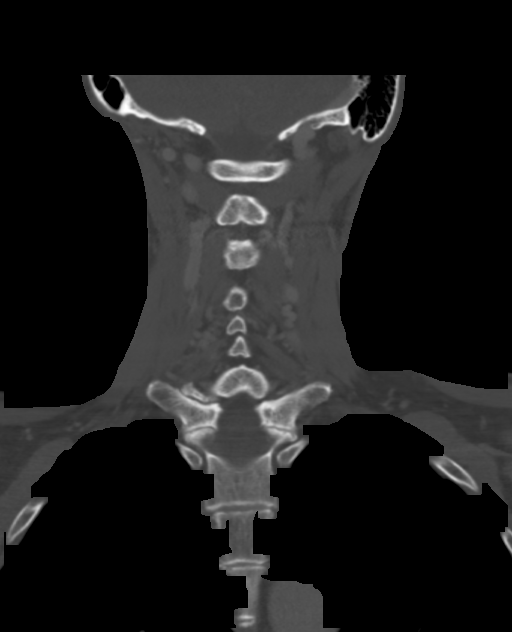

[13 of 33 positions shown; findings below may reference images not displayed]

FINDINGS: Pharynx and larynx: No focal mucosal or submucosal lesions are
present. The tongue base is within normal limits. Vocal cords are
midline and symmetric.

Salivary glands: The parotid and submandibular glands are within
normal limits bilaterally.

Thyroid: Within normal limits.

Lymph nodes: Focal inflammatory change is present posterior to the
left mastoid. There 2 small lymph nodes which correspond to the
findings on ultrasound. These measure 4 and 5 mm respectively. There
may be some thickening of the scan. No discrete mass lesion is
present. No other significant adenopathy is present.

Vascular: No focal stenosis or atherosclerotic change is present.
Prominent posterior cervical veins are noted, unlikely to be of
consequence to the patient.

Limited intracranial: Unremarkable.

Visualized orbits: Within normal limits.

Mastoids and visualized paranasal sinuses: Clear

Skeleton: Multilevel endplate degenerative changes are present in
the cervical spine with chronic loss of disc height at C3-4, C4-5,
C5-6, and C6-7. Uncovertebral spurring is most pronounced at C4-5
bilaterally. No focal lytic or blastic lesions are present.

Upper chest: The lung apices are clear bilaterally.
IMPRESSION: 1. Small inflamed lymph nodes are present posterior to the left
mastoid, corresponding to the patient complained and recent
ultrasound.
2. No significant adenopathy otherwise.
3. No primary lesion to suggest metastatic disease. This appears
inflamed.
4. Moderate spondylosis of the cervical spine.

## 2017-02-15 ENCOUNTER — Encounter: Payer: Self-pay | Admitting: Family Medicine

## 2017-02-15 DIAGNOSIS — H2513 Age-related nuclear cataract, bilateral: Secondary | ICD-10-CM | POA: Diagnosis not present

## 2017-02-15 DIAGNOSIS — H43812 Vitreous degeneration, left eye: Secondary | ICD-10-CM | POA: Diagnosis not present

## 2017-02-15 DIAGNOSIS — H43811 Vitreous degeneration, right eye: Secondary | ICD-10-CM | POA: Diagnosis not present

## 2017-02-15 DIAGNOSIS — H01021 Squamous blepharitis right upper eyelid: Secondary | ICD-10-CM | POA: Diagnosis not present

## 2017-02-15 DIAGNOSIS — H01022 Squamous blepharitis right lower eyelid: Secondary | ICD-10-CM | POA: Diagnosis not present

## 2017-02-15 DIAGNOSIS — H01025 Squamous blepharitis left lower eyelid: Secondary | ICD-10-CM | POA: Diagnosis not present

## 2017-02-15 DIAGNOSIS — H01024 Squamous blepharitis left upper eyelid: Secondary | ICD-10-CM | POA: Diagnosis not present

## 2017-02-26 ENCOUNTER — Encounter: Payer: Self-pay | Admitting: *Deleted

## 2017-03-01 DIAGNOSIS — S0501XA Injury of conjunctiva and corneal abrasion without foreign body, right eye, initial encounter: Secondary | ICD-10-CM | POA: Diagnosis not present

## 2017-03-12 ENCOUNTER — Ambulatory Visit (INDEPENDENT_AMBULATORY_CARE_PROVIDER_SITE_OTHER): Payer: Medicare Other | Admitting: Internal Medicine

## 2017-03-12 ENCOUNTER — Encounter: Payer: Self-pay | Admitting: Internal Medicine

## 2017-03-12 VITALS — BP 134/88 | HR 52 | Ht 66.0 in | Wt 127.4 lb

## 2017-03-12 DIAGNOSIS — I493 Ventricular premature depolarization: Secondary | ICD-10-CM | POA: Diagnosis not present

## 2017-03-12 DIAGNOSIS — I10 Essential (primary) hypertension: Secondary | ICD-10-CM | POA: Diagnosis not present

## 2017-03-12 DIAGNOSIS — I259 Chronic ischemic heart disease, unspecified: Secondary | ICD-10-CM | POA: Diagnosis not present

## 2017-03-12 NOTE — Progress Notes (Signed)
Cardiology Office Note   Date:  03/12/2017   ID:  Carrie [name], DOB 04/17/1945, MRN [id number]  PCP:  [name], MD  Cardiologist:   Dorris Carnes, MD   Pt returns for f/u of PVCs, palpitations      History of Present Illness: Carrie [name] is a 72 y.o. female with a history of PVCs   She ws previusly followed by [facility]. Echo LVEF 55 to 65%  Chlorothaladone was stopped and she was placed on atenolol.     Since stopping she says thatt she is doing good  No skips  Breathing OK    Works out 5x per wk  Cardio for 2 days   Strength for 3  Days      Current Meds  Medication Sig  . Ascorbic Acid (VITAMIN C) 1000 MG tablet Take 1,000 mg by mouth daily.  Marland Kitchen atenolol (TENORMIN) 25 MG tablet TAKE ONE HALF TABLETS (12.5 MG TOTAL) BY MOUTH DAILY.  . Calcium-Vitamin D-Vitamin K (CALCIUM SOFT CHEWS PO) Take 1 tablet by mouth 2 (two) times daily.  . Cholecalciferol (VITAMIN D3) 1000 UNITS CAPS Take 1 capsule by mouth daily.  Marland Kitchen glucosamine-chondroitin 500-400 MG tablet Take 1 tablet by mouth daily.    . Omega-3 Fatty Acids (FISH OIL) 1200 MG CAPS Take 1,200 mg by mouth 2 (two) times daily.   . ranitidine (ZANTAC) 300 MG tablet Take 0.5 tablets (150 mg total) by mouth daily.  . Vitamins-Lipotropics (B-50) TABS Take 1 tablet by mouth daily.      Allergies:   Hydrocodone-acetaminophen; Hydrocodone-acetaminophen; Codeine; Demerol; Hydrocodone-acetaminophen; and Meperidine hcl   Past Medical History:  Diagnosis Date  . Breast cancer ([place]) 08/2004   Left breast invasive ductal carcinoma  . CHICKENPOX, HX OF 03/03/2010  . COMMON MIGRAINE 03/03/2010  . DEGENERATIVE DISC DISEASE 03/03/2010  . Essential hypertension, benign 03/03/2010  . Foot pain, bilateral 02/08/2016  . HYPOKALEMIA 03/03/2010  . Increased vitamin B12 level 01/10/2015  . Insomnia 08/01/2016  . Low back pain 08/01/2016  . MEASLES, HX OF 03/03/2010  . Melanoma of skin, site unspecified 03/03/2010  . 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 NEOPLASM, MALIGNANT, BREAST, HX OF 03/03/2010  . OSTEOPENIA 03/03/2010  . PALPITATIONS, HX OF 03/03/2010  . Pulmonary nodules 01/07/2015  . Rib fracture    left, 3 fractures s/p radiation: right side 2 fractured, no surgery    Past Surgical History:  Procedure Laterality Date  . BREAST LUMPECTOMY WITH NEEDLE LOCALIZATION AND AXILLARY SENTINEL LYMPH NODE BX Left 08/2004  . clavicle dislocate  72 yrs old   right, reapproximated w/pins, subsequently removed  . HERNIA REPAIR  05/2012  . left hip traumatic history     hematoma surgically evacuated  . SHOULDER ARTHROSCOPY     b/l shoulder with debridement  . TONSILLECTOMY       Social History:  The patient  reports that she quit smoking about 49 years ago. She smoked 0.25 packs per day. she has never used smokeless tobacco. She reports that she drinks alcohol. She reports that she does not use drugs.   Family History:  The patient's family history includes Alcohol abuse in her father; Basal cell carcinoma in her sister; COPD in her mother; Coronary artery disease in her father; Heart disease in her maternal grandmother; Hypertension in her mother; Migraines in her sister; Osteoarthritis in her brother and sister; Stroke in her maternal grandfather.    ROS:  Please see the history of present illness.  All other systems are reviewed and  Negative to the above problem except as noted.    PHYSICAL EXAM: VS:  BP 134/88   Pulse (!) 52   Ht 5\' 6"  (1.676 m)   Wt 127 lb 6.4 oz (57.8 kg)   BMI 20.56 kg/m   GEN: Well nourished, well developed, in no acute distress  HEENT: normal  Neck: no JVD, carotid bruits, or masses Cardiac: RRR; no murmurs, rubs, or gallops,no edema  Respiratory:  clear to auscultation bilaterally, normal work of breathing GI: soft, nontender, nondistended, + BS  No hepatomegaly  MS: no deformity Moving all extremities   Skin: warm and dry, no rash Neuro:  Strength and sensation are intact Psych: euthymic  mood, full affect   EKG:  EKG is ordered today.  SB  54 bpm     Lipid Panel    Component Value Date/Time   CHOL 200 (H) 01/21/2016 1454   TRIG 79 01/21/2016 1454   HDL 101 01/21/2016 1454   CHOLHDL 2.0 01/21/2016 1454   VLDL 16 01/21/2016 1454   LDLCALC 83 01/21/2016 1454   LDLDIRECT 94.8 07/28/2010 0913      Wt Readings from Last 3 Encounters:  03/12/17 127 lb 6.4 oz (57.8 kg)  01/23/17 129 lb 3.2 oz (58.6 kg)  09/19/16 124 lb 6.4 oz (56.4 kg)      ASSESSMENT AND PLAN:  1  PVCs  Denies symptoms    2  BP  On my check BP is 120/80  Good  Follow   I will be available as needed if above change  For now no new recommendations   Pt will call  Stays active Follow with S [facility]     Current medicines are reviewed at length with the patient today.  The patient does not have concerns regarding medicines.  Signed, Dorris Carnes, MD  03/12/2017 [facility] [place], [place], [place]  [address] Phone: [phone]; Fax: [phone]

## 2017-03-15 NOTE — Addendum Note (Signed)
Addended by: Patterson Hammersmith A on: 03/15/2017 09:47 AM   Modules accepted: Orders

## 2017-03-22 DIAGNOSIS — Z01419 Encounter for gynecological examination (general) (routine) without abnormal findings: Secondary | ICD-10-CM | POA: Diagnosis not present

## 2017-03-22 DIAGNOSIS — Z1231 Encounter for screening mammogram for malignant neoplasm of breast: Secondary | ICD-10-CM | POA: Diagnosis not present

## 2017-03-22 DIAGNOSIS — Z6821 Body mass index (BMI) 21.0-21.9, adult: Secondary | ICD-10-CM | POA: Diagnosis not present

## 2017-03-22 DIAGNOSIS — Z853 Personal history of malignant neoplasm of breast: Secondary | ICD-10-CM | POA: Diagnosis not present

## 2017-04-24 DIAGNOSIS — L720 Epidermal cyst: Secondary | ICD-10-CM | POA: Diagnosis not present

## 2017-04-24 DIAGNOSIS — D225 Melanocytic nevi of trunk: Secondary | ICD-10-CM | POA: Diagnosis not present

## 2017-04-24 DIAGNOSIS — D1801 Hemangioma of skin and subcutaneous tissue: Secondary | ICD-10-CM | POA: Diagnosis not present

## 2017-04-24 DIAGNOSIS — Z85820 Personal history of malignant melanoma of skin: Secondary | ICD-10-CM | POA: Diagnosis not present

## 2017-04-24 DIAGNOSIS — Z85828 Personal history of other malignant neoplasm of skin: Secondary | ICD-10-CM | POA: Diagnosis not present

## 2017-04-24 DIAGNOSIS — L570 Actinic keratosis: Secondary | ICD-10-CM | POA: Diagnosis not present

## 2017-04-24 DIAGNOSIS — L821 Other seborrheic keratosis: Secondary | ICD-10-CM | POA: Diagnosis not present

## 2017-06-05 ENCOUNTER — Other Ambulatory Visit: Payer: Self-pay | Admitting: Family Medicine

## 2017-06-13 ENCOUNTER — Ambulatory Visit: Payer: Self-pay | Admitting: *Deleted

## 2017-06-13 DIAGNOSIS — L309 Dermatitis, unspecified: Secondary | ICD-10-CM | POA: Diagnosis not present

## 2017-06-13 NOTE — Telephone Encounter (Signed)
Pt reports on Monday 06/11/17 she noted "tiny black speck" on right inner thigh, which she "picked off."  Now she thinks it may have been a deer tick. Area is red, raised "Size of pea"  Itching. This AM developed fluid filled blister, red around the edges, circular. No tenderness, unsure if warm to touch. States if was a tick, she is not sure she removed entire body. [place] Directed to UC for further evaluation.  Reason for Disposition . [1] Red or very tender (to touch) area AND [2] getting larger over 48 hours after the bite    Possible tick; now blistered with red around edges,circular.  Answer Assessment - Initial Assessment Questions 1. TYPE of INSECT: "What type of insect was it?"      Unsure, possibly a tick 2. ONSET: "When did you get bitten?"      Noted Monday 06/11/17 3. LOCATION: "Where is the insect bite located?"      Right inner thigh 4. REDNESS: "Is the area red or pink?" If so, ask "What size is area of redness?" (inches or cm). "When did the redness start?"     Yes,size of pea, now blistered in middle with red around the edges. 5. PAIN: "Is there any pain?" If so, ask: "How bad is it?"  (Scale 1-10; or mild, moderate, severe)     no 6. ITCHING: "Does it itch?" If so, ask: "How bad is the itch?"    - MILD: doesn't interfere with normal activities   - MODERATE-SEVERE: interferes with work, school, sleep, or other activities      Moderate itching 7. SWELLING: "How big is the swelling?" (inches, cm, or compare to coins)     "Size of pea." 8. OTHER SYMPTOMS: "Do you have any other symptoms?"  (e.g., difficulty breathing, hives)     no  Protocols used: INSECT BITE-A-AH

## 2017-06-26 ENCOUNTER — Other Ambulatory Visit: Payer: Self-pay | Admitting: Family Medicine

## 2017-07-05 DIAGNOSIS — S0501XD Injury of conjunctiva and corneal abrasion without foreign body, right eye, subsequent encounter: Secondary | ICD-10-CM | POA: Diagnosis not present

## 2017-07-10 DIAGNOSIS — Z79899 Other long term (current) drug therapy: Secondary | ICD-10-CM | POA: Diagnosis not present

## 2017-07-10 DIAGNOSIS — R21 Rash and other nonspecific skin eruption: Secondary | ICD-10-CM | POA: Diagnosis not present

## 2017-07-10 DIAGNOSIS — L509 Urticaria, unspecified: Secondary | ICD-10-CM | POA: Diagnosis not present

## 2017-07-10 DIAGNOSIS — Z885 Allergy status to narcotic agent status: Secondary | ICD-10-CM | POA: Diagnosis not present

## 2017-07-12 ENCOUNTER — Ambulatory Visit: Payer: Self-pay | Admitting: *Deleted

## 2017-07-12 DIAGNOSIS — L509 Urticaria, unspecified: Secondary | ICD-10-CM | POA: Diagnosis not present

## 2017-07-12 DIAGNOSIS — K219 Gastro-esophageal reflux disease without esophagitis: Secondary | ICD-10-CM | POA: Diagnosis not present

## 2017-07-12 DIAGNOSIS — Z853 Personal history of malignant neoplasm of breast: Secondary | ICD-10-CM | POA: Diagnosis not present

## 2017-07-12 DIAGNOSIS — R21 Rash and other nonspecific skin eruption: Secondary | ICD-10-CM | POA: Diagnosis not present

## 2017-07-12 DIAGNOSIS — Z885 Allergy status to narcotic agent status: Secondary | ICD-10-CM | POA: Diagnosis not present

## 2017-07-12 DIAGNOSIS — Z79899 Other long term (current) drug therapy: Secondary | ICD-10-CM | POA: Diagnosis not present

## 2017-07-12 DIAGNOSIS — Z888 Allergy status to other drugs, medicaments and biological substances status: Secondary | ICD-10-CM | POA: Diagnosis not present

## 2017-07-12 NOTE — Telephone Encounter (Signed)
Reach out to patient and advise since she is having recurrent reaction should take Zyrtec 10 mg bid and Zantac 150 mg bid for the next month and we can give her a medrol taper if they did not give any steroids. She also can come in to discuss and/or I can refer her to a allergist if she continues to struggle

## 2017-07-12 NOTE — Telephone Encounter (Signed)
Patient was seen at [facility] ED for allergic reaction and treated.She is having another reaction- her face is swollen and she has hives all over with itching. Advised patient to return to ED for treatment.  Reason for Disposition . [1] SEVERE swelling of entire face AND [2] < 2 hours since exposure to high-risk allergen (e.g., peanuts, tree nuts, fish, shellfish or 1st dose of drug) AND [3] no serious symptoms AND [4] no serious allergic reaction in the past  Answer Assessment - Initial Assessment Questions 1. ONSET: "When did the swelling start?" (e.g., minutes, hours, days)     During the night 2. LOCATION: "What part of the face is swollen?"     Lips, eyes and face- inside ears 3. SEVERITY: "How swollen is it?"     Patient can see- but hearing on L is muffled. Patient states swolling was difficult last night 4. ITCHING: "Is there any itching?" If so, ask: "How much?"   (Scale 1-10; mild, moderate or severe)     yes 5. PAIN: "Is the swelling painful to touch?" If so, ask: "How painful is it?"   (Scale 1-10; mild, moderate or severe)     no 6. FEVER: "Do you have a fever?" If so, ask: "What is it, how was it measured, and when did it start?"      no 7. CAUSE: "What do you think is causing the face swelling?"     Hives all on- patient was seen at ED- IV given, steroid, anti inflammatory 8. RECURRENT SYMPTOM: "Have you had face swelling before?" If so, ask: "When was the last time?" "What happened that time?"     no 9. OTHER SYMPTOMS: "Do you have any other symptoms?" (e.g., toothache, leg swelling)     Hives all over 10. PREGNANCY: "Is there any chance you are pregnant?" "When was your last menstrual period?"       n/a  Protocols used: [facility]

## 2017-07-13 ENCOUNTER — Ambulatory Visit: Payer: Self-pay | Admitting: *Deleted

## 2017-07-13 ENCOUNTER — Ambulatory Visit (INDEPENDENT_AMBULATORY_CARE_PROVIDER_SITE_OTHER): Payer: Medicare Other | Admitting: Family Medicine

## 2017-07-13 ENCOUNTER — Encounter: Payer: Self-pay | Admitting: Family Medicine

## 2017-07-13 VITALS — BP 108/76 | HR 65 | Temp 98.4°F | Ht 66.0 in | Wt 129.4 lb

## 2017-07-13 DIAGNOSIS — Z885 Allergy status to narcotic agent status: Secondary | ICD-10-CM | POA: Diagnosis not present

## 2017-07-13 DIAGNOSIS — L509 Urticaria, unspecified: Secondary | ICD-10-CM | POA: Diagnosis not present

## 2017-07-13 DIAGNOSIS — T7840XA Allergy, unspecified, initial encounter: Secondary | ICD-10-CM | POA: Diagnosis not present

## 2017-07-13 DIAGNOSIS — Z79899 Other long term (current) drug therapy: Secondary | ICD-10-CM | POA: Diagnosis not present

## 2017-07-13 MED ORDER — EPINEPHRINE 0.3 MG/0.3ML IJ SOAJ: 0.3000 mg | Freq: Once | INTRAMUSCULAR | 1 refills | Status: AC

## 2017-07-13 NOTE — Telephone Encounter (Signed)
Patient has been seen twice this week for hives in er.Patient was seen last night.Pt taking Prednisone and benadry and anti itch rx . Patient reports symptoms are better but still has hives when the meds ran out. She is speaking in complete sentances and in no acute distress. Appointment made for today with Dr Nani Ravens. Patient advised to call 911 if symptoms become severe .  Advised drowsiness precautions with Benadryl  Reason for Disposition . [1] MODERATE-SEVERE hives persist (i.e., hives interfere with normal activities or work) AND [2] taking antihistamine (e.g., Benadryl, Claritin) > 24 hours  Answer Assessment - Initial Assessment Questions 1. APPEARANCE: "What does the rash look like?"       Whelps red  2. LOCATION: "Where is the rash located?"      All over   3. NUMBER: "How many hives are there?"      Multiple   4. SIZE: "How big are the hives?" (inches, cm, compare to coins) "Do they all look the same or is there lots of variation in shape and size?"        Different sizes   5. ONSET: "When did the hives begin?" (Hours or days ago)         3  Days   6. ITCHING: "Does it itch?" If so, ask: "How bad is the itch?"    - MILD: doesn't interfere with normal activities   - MODERATE-SEVERE: interferes with work, school, sleep, or other activities        Mod  7. RECURRENT PROBLEM: "Have you had hives before?" If so, ask: "When was the last time?" and "What happened that time?"         Had a reaction to demerol  Years ago  8. TRIGGERS: "Were you exposed to any new food, plant, cosmetic product or animal just before the hives began?"         Possibly  Chemical mosquito repellent candle - but it actually did not touch skin   9. OTHER SYMPTOMS: "Do you have any other symptoms?" (e.g., fever, tongue swelling, difficulty breathing, abdominal pain)       Slight difficulty swallowing but better than yesterday  slight facial swelling today but much better today  - pt is not in any resp distress    10. PREGNANCY: "Is there any chance you are pregnant?" "When was your last menstrual period?"       n/a  Protocols used: HIVES-A-AH

## 2017-07-13 NOTE — Progress Notes (Signed)
Chief Complaint  Patient presents with  . Urticaria    Carrie [name] is a 71 y.o. female here for an allergic reaction.  Duration: 3 days Any new medications, lotions, soaps, topicals or detergents? No ACEi/ARB/Estrogen? No Hx of allergic rxn/angioedema/anaphylaxis? No She specifically denies shortness of breath, tongue or lip swelling, or swelling in the throat. She went to the emergency room on Tuesday 3 days ago and was given hydroxyzine and a shot of Solu-Medrol.  Her symptoms resolved but came back yesterday.  She had a resurgence of hives and started having swelling in her neck and mouth.  She went to the emergency department again and got the allergy cocktail and was sent home with a prednisone burst.  It has helped slightly again.  Right now she does have some hives that are not itchy and are improving from yesterday.  ROS Allergic: As noted in HPI Pulmonary: No SOB MSK: No masses  Past Medical History:  Diagnosis Date  . Breast cancer ([place]) 08/2004   Left breast invasive ductal carcinoma  . CHICKENPOX, HX OF 03/03/2010  . COMMON MIGRAINE 03/03/2010  . DEGENERATIVE DISC DISEASE 03/03/2010  . Essential hypertension, benign 03/03/2010  . Foot pain, bilateral 02/08/2016  . HYPOKALEMIA 03/03/2010  . Increased vitamin B12 level 01/10/2015  . Insomnia 08/01/2016  . Low back pain 08/01/2016  . MEASLES, HX OF 03/03/2010  . Melanoma of skin, site unspecified 03/03/2010  . 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 NEOPLASM, MALIGNANT, BREAST, HX OF 03/03/2010  . OSTEOPENIA 03/03/2010  . PALPITATIONS, HX OF 03/03/2010  . Pulmonary nodules 01/07/2015  . Rib fracture    left, 3 fractures s/p radiation: right side 2 fractured, no surgery   BP 108/76 (BP Location: Left Arm, Patient Position: Sitting, Cuff Size: Normal)   Pulse 65   Temp 98.4 F (36.9 C) (Oral)   Ht 5\' 6"  (1.676 m)   Wt 129 lb 6 oz (58.7 kg)   SpO2 98%   BMI 20.88 kg/m  General: Well appearing, appearing stated age,  well-nourished, awake HEENT: Ears are patent, TM's negative, Nose patent without discharge, MMM, tongue without deviation or edema, uvula without edema, pharynx without erythema or petechiae; Neck without masses, edema or asymmetry Heart: RRR, no murmurs Lungs: CTAB, no rales or stridor, normal respiratory effort without accessory muscle use Neuro: Alert and oriented, fluent and goal-oriented speech Skin: Exposed skin is warm and dry with urticaria over the low back, abdomen, and upper extremities bilaterally Psych: Age appropriate judgment and insight, normal affect and mood  Allergic reaction, initial encounter - Plan: EPINEPHrine (EPIPEN 2-PAK) 0.3 mg/0.3 mL IJ SOAJ injection, Ambulatory referral to Allergy  Orders as above. I reviewed her medicines/supplements and did not see anything that would be beneficial to stop at this time.  Given her angioedema, will refer to the allergy team and call in an EpiPen.  Recommended a daily antihistamine with Benadryl or hydroxyzine as needed.  Continue ranitidine. Pt informed to seek emergent care if starting to experience SOB, swelling with tongue or airway/neck.  F/u as needed otherwise. The patient voiced understanding and agreement to the plan.  Kimball, DO 07/13/17 2:29 PM

## 2017-07-13 NOTE — Patient Instructions (Addendum)
OK to take Benadryl OR hydroxyzine during periods of itching.   Claritin (loratadine), Allegra (fexofenadine), Zyrtec (cetirizine); these are listed in order from weakest to strongest. Generic, and therefore cheaper, options are in the parentheses.   There are available OTC, and the generic versions, which may be cheaper, are in parentheses. Show this to a pharmacist if you have trouble finding any of these items.  Swelling in mouth/neck/throat/shortness of breath should prompt you to go to ER.  Let us know if you need anything.

## 2017-07-13 NOTE — Progress Notes (Signed)
Pre visit review using our clinic review tool, if applicable. No additional management support is needed unless otherwise documented below in the visit note. 

## 2017-07-16 ENCOUNTER — Encounter: Payer: Self-pay | Admitting: Family Medicine

## 2017-07-17 NOTE — Telephone Encounter (Signed)
Pt states she is leaving for [facility] in a week and would like to be seen before then.

## 2017-07-17 NOTE — Telephone Encounter (Signed)
I can do a quick visit THursday at 7:30 but do not really see any other options

## 2017-07-17 NOTE — Telephone Encounter (Signed)
Spoke with pt she states she rather come in for and appointment working on scheduling appointment now

## 2017-07-18 NOTE — Telephone Encounter (Signed)
Appointment made for 6/13 at 0730.

## 2017-07-18 NOTE — Telephone Encounter (Signed)
Pt is able to come Thurs 6/13 at 7:30. Will you please put her on the schedule?

## 2017-07-19 ENCOUNTER — Encounter: Payer: Self-pay | Admitting: Family Medicine

## 2017-07-19 ENCOUNTER — Telehealth: Payer: Self-pay | Admitting: Family Medicine

## 2017-07-19 ENCOUNTER — Ambulatory Visit (INDEPENDENT_AMBULATORY_CARE_PROVIDER_SITE_OTHER): Payer: Medicare Other | Admitting: Family Medicine

## 2017-07-19 DIAGNOSIS — F419 Anxiety disorder, unspecified: Secondary | ICD-10-CM

## 2017-07-19 DIAGNOSIS — G4700 Insomnia, unspecified: Secondary | ICD-10-CM

## 2017-07-19 DIAGNOSIS — L509 Urticaria, unspecified: Secondary | ICD-10-CM | POA: Diagnosis not present

## 2017-07-19 DIAGNOSIS — I10 Essential (primary) hypertension: Secondary | ICD-10-CM

## 2017-07-19 MED ORDER — METHYLPREDNISOLONE 4 MG PO TABS: ORAL_TABLET | ORAL | 0 refills | Status: DC

## 2017-07-19 MED ORDER — METOPROLOL SUCCINATE ER 25 MG PO TB24: 25.0000 mg | ORAL_TABLET | Freq: Every day | ORAL | 3 refills | Status: DC

## 2017-07-19 MED ORDER — RANITIDINE HCL 150 MG PO TABS: 150.0000 mg | ORAL_TABLET | Freq: Two times a day (BID) | ORAL | 2 refills | Status: DC | PRN

## 2017-07-19 MED ORDER — CETIRIZINE HCL 10 MG PO TABS: 10.0000 mg | ORAL_TABLET | Freq: Two times a day (BID) | ORAL | 2 refills | Status: DC | PRN

## 2017-07-19 MED ORDER — ALPRAZOLAM 0.25 MG PO TABS: 0.2500 mg | ORAL_TABLET | Freq: Two times a day (BID) | ORAL | 0 refills | Status: DC | PRN

## 2017-07-19 MED FILL — CETIRIZINE HCL 10 MG TABLET: 10 | 50 days supply | Qty: 100 | Fill #0

## 2017-07-19 MED FILL — raNITIdine HCL 150 MG TABS: 150 | 30 days supply | Qty: 60 | Fill #0

## 2017-07-19 NOTE — Telephone Encounter (Signed)
Copied from [place] [id number]. Topic: Quick Communication - See Telephone Encounter >> Jul 19, 2017  2:28 PM Hewitt Shorts wrote: Pt is needing to know if she should be taking atenolol and metoprolol together or just metoprolol  Best number [phone]

## 2017-07-19 NOTE — Assessment & Plan Note (Signed)
Poorly controlled will alter medications, encouraged DASH diet, minimize caffeine and obtain adequate sleep. Report concerning symptoms and follow up as directed and as needed. Add Metoprolol XR 25 mg po daily

## 2017-07-19 NOTE — Assessment & Plan Note (Addendum)
On adrenaline overload, with her mother's recent passing her daughter pregnant as a single parent and more. She is offered a daily SSRI but will hold off for now. Given Alprazolam to use sparingly prn. Spent 25 minutes of a 30 minute visit in counseling

## 2017-07-19 NOTE — Patient Instructions (Addendum)
Melatonin, Unisom, Benadryl can help sleep OTC Encouraged good sleep hygiene such as dark, quiet room. No blue/green glowing lights such as computer screens in bedroom. No alcohol or stimulants in evening. Cut down on caffeine as able. Regular exercise is helpful but not just prior to bed time.   Zyrtec and Zantac twice a day for a month Alprazolam as needed for anxiety/insomnia Hives Hives (urticaria) are itchy, red, swollen areas on your skin. Hives can appear on any part of your body and can vary in size. They can be as small as the tip of a pen or much larger. Hives often fade within 24 hours (acute hives). In other cases, new hives appear after old ones fade. This cycle can continue for several days or weeks (chronic hives). Hives result from your body's reaction to an irritant or to something that you are allergic to (trigger). When you are exposed to a trigger, your body releases a chemical (histamine) that causes redness, itching, and swelling. You can get hives immediately after being exposed to a trigger or hours later. Hives do not spread from person to person (are not contagious). Your hives may get worse with scratching, exercise, and emotional stress. What are the causes? Causes of this condition include:  Allergies to certain foods or ingredients.  Insect bites or stings.  Exposure to pollen or pet dander.  Contact with latex or chemicals.  Spending time in sunlight, heat, or cold (exposure).  Exercise.  Stress.  You can also get hives from some medical conditions and treatments. These include:  Viruses, including the common cold.  Bacterial infections, such as urinary tract infections and strep throat.  Disorders such as vasculitis, lupus, or thyroid disease.  Certain medications.  Allergy shots.  Blood transfusions.  Sometimes, the cause of hives is not known (idiopathic hives). What increases the risk? This condition is more likely to develop  in:  Women.  People who have food allergies, especially to citrus fruits, milk, eggs, peanuts, tree nuts, or shellfish.  People who are allergic to: ? Medicines. ? Latex. ? Insects. ? Animals. ? Pollen.  People who have certain medical conditions, includinglupus or thyroid disease.  What are the signs or symptoms? The main symptom of this condition is raised, itchyred or white bumps or patches on your skin. These areas may:  Become large and swollen (welts).  Change in shape and location, quickly and repeatedly.  Be separate hives or connect over a large area of skin.  Sting or become painful.  Turn white when pressed in the center (blanch).  In severe cases, yourhands, feet, and face may also become swollen. This may occur if hives develop deeper in your skin. How is this diagnosed? This condition is diagnosed based on your symptoms, medical history, and physical exam. Your skin, urine, or blood may be tested to find out what is causing your hives and to rule out other health issues. Your health care provider may also remove a small sample of skin from the affected area and examine it under a microscope (biopsy). How is this treated? Treatment depends on the severity of your condition. Your health care provider may recommend using cool, wet cloths (cool compresses) or taking cool showers to relieve itching. Hives are sometimes treated with medicines, including:  Antihistamines.  Corticosteroids.  Antibiotics.  An injectable medicine (omalizumab). Your health care provider may prescribe this if you have chronic idiopathic hives and you continue to have symptoms even after treatment with antihistamines.  Severe  cases may require an emergency injection of adrenaline (epinephrine) to prevent a life-threatening allergic reaction (anaphylaxis). Follow these instructions at home: Medicines  Take or apply over-the-counter and prescription medicines only as told by your  health care provider.  If you were prescribed an antibiotic medicine, use it as told by your health care provider. Do not stop taking the antibiotic even if you start to feel better. Skin Care  Apply cool compresses to the affected areas.  Do not scratch or rub your skin. General instructions  Do not take hot showers or baths. This can make itching worse.  Do not wear tight-fitting clothing.  Use sunscreen and wear protective clothing when you are outside.  Avoid any substances that cause your hives. Keep a journal to help you track what causes your hives. Write down: ? What medicines you take. ? What you eat and drink. ? What products you use on your skin.  Keep all follow-up visits as told by your health care provider. This is important. Contact a health care provider if:  Your symptoms are not controlled with medicine.  Your joints are painful or swollen. Get help right away if:  You have a fever.  You have pain in your abdomen.  Your tongue or lips are swollen.  Your eyelids are swollen.  Your chest or throat feels tight.  You have trouble breathing or swallowing. These symptoms may represent a serious problem that is an emergency. Do not wait to see if the symptoms will go away. Get medical help right away. Call your local emergency services (911 in the [place] Do not drive yourself to the hospital. This information is not intended to replace advice given to you by your health care provider. Make sure you discuss any questions you have with your health care provider. Document Released: 01/23/2005 Document Revised: 06/23/2015 Document Reviewed: 11/11/2014 Elsevier Interactive Patient Education  2018 [employer].

## 2017-07-19 NOTE — Telephone Encounter (Signed)
Left detailed message on voicemail that pt is to take both atenolol and metoprolol according to the notes during OV with Dr. Charlett Blake on 07/19/17. Advised pt to call office back with additional concerns or questions.

## 2017-07-19 NOTE — Progress Notes (Signed)
Subjective:  I acted as a Education administrator for Dr. Charlett Blake. [place], [place]  Patient ID: Carrie [name], female    DOB: 1945/04/01, 72 y.o.   MRN: [id number]  No chief complaint on file.   HPI  Patient is in today for a medication follow up. On adrenaline overload, with her mother's recent passing her daughter pregnant as a single parent and more. She is offered a daily SSRI but will hold off for now. She has been struggling with diffuse urticaria and has responded to histamine blockade and steroids but does not like the way the steroids put her on edge. She denies any new products and believes this is all stress related. Denies CP/palp/SOB/HA/congestion/fevers/GI or GU c/o. Taking meds as prescribed  Patient Care Team: [name], MD as PCP - General (Family Medicine) Justice Britain, MD as Consulting Physician (Orthopedic Surgery) Danella Sensing, MD as Consulting Physician (Dermatology) Richmond Campbell, MD as Consulting Physician (Gastroenterology) Fay Records, MD as Consulting Physician (Cardiology) Linda Hedges, DO as Consulting Physician (Obstetrics and Gynecology)   Past Medical History:  Diagnosis Date  . Breast cancer ([facility]) 08/2004   Left breast invasive ductal carcinoma  . CHICKENPOX, HX OF 03/03/2010  . COMMON MIGRAINE 03/03/2010  . DEGENERATIVE DISC DISEASE 03/03/2010  . Essential hypertension, benign 03/03/2010  . Foot pain, bilateral 02/08/2016  . HYPOKALEMIA 03/03/2010  . Increased vitamin B12 level 01/10/2015  . Insomnia 08/01/2016  . Low back pain 08/01/2016  . MEASLES, HX OF 03/03/2010  . Melanoma of skin, site unspecified 03/03/2010  . 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 NEOPLASM, MALIGNANT, BREAST, HX OF 03/03/2010  . OSTEOPENIA 03/03/2010  . PALPITATIONS, HX OF 03/03/2010  . Pulmonary nodules 01/07/2015  . Rib fracture    left, 3 fractures s/p radiation: right side 2 fractured, no surgery    Past Surgical History:  Procedure Laterality Date  . BREAST LUMPECTOMY WITH NEEDLE  LOCALIZATION AND AXILLARY SENTINEL LYMPH NODE BX Left 08/2004  . clavicle dislocate  72 yrs old   right, reapproximated w/pins, subsequently removed  . HERNIA REPAIR  05/2012  . left hip traumatic history     hematoma surgically evacuated  . SHOULDER ARTHROSCOPY     b/l shoulder with debridement  . TONSILLECTOMY      Family History  Problem Relation Age of Onset  . Hypertension Mother   . COPD Mother   . Alcohol abuse Father        history of  . Coronary artery disease Father   . Osteoarthritis Brother        knees  . Basal cell carcinoma Sister   . Heart disease Maternal Grandmother   . Stroke Maternal Grandfather   . Osteoarthritis Sister        knees  . Migraines Sister     Social History   Socioeconomic History  . Marital status: Married    Spouse name: Not on file  . Number of children: Not on file  . Years of education: Not on file  . Highest education level: Not on file  Occupational History  . Not on file  Social Needs  . Financial resource strain: Not on file  . Food insecurity:    Worry: Not on file    Inability: Not on file  . Transportation needs:    Medical: Not on file    Non-medical: Not on file  Tobacco Use  . Smoking status: Former Smoker    Packs/day: 0.25    Last attempt to  quit: 02/07/1968    Years since quitting: 49.4  . Smokeless tobacco: Never Used  Substance and Sexual Activity  . Alcohol use: Yes    Comment: 1-2 glasses of wine per day  . Drug use: No  . 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 Physical activity:    Days per week: Not on file    Minutes per session: Not on file  . Stress: Not on file  Relationships  . Social connections:    Talks on phone: Not on file    Gets together: Not on file    Attends religious service: Not on file    Active member of club or organization: Not on file    Attends meetings of  clubs or organizations: Not on file    Relationship status: Not on file  . Intimate partner violence:    Fear of current or ex partner: Not on file    Emotionally abused: Not on file    Physically abused: Not on file    Forced sexual activity: Not on file  Other Topics Concern  . Not on file  Social History Narrative  . Not on file    Outpatient Medications Prior to Visit  Medication Sig Dispense Refill  . Ascorbic Acid (VITAMIN C) 1000 MG tablet Take 1,000 mg by mouth daily.    Marland Kitchen atenolol (TENORMIN) 25 MG tablet TAKE ONE HALF TABLETS (12.5 MG TOTAL) BY MOUTH DAILY. 90 tablet 0  . Calcium-Vitamin D-Vitamin K (CALCIUM SOFT CHEWS PO) Take 1 tablet by mouth 2 (two) times daily.    . Cholecalciferol (VITAMIN D3) 1000 UNITS CAPS Take 1 capsule by mouth daily.    Marland Kitchen glucosamine-chondroitin 500-400 MG tablet Take 1 tablet by mouth daily.      . Omega-3 Fatty Acids (FISH OIL) 1200 MG CAPS Take 1,200 mg by mouth 2 (two) times daily.     . ranitidine (ZANTAC) 300 MG tablet TAKE 1/2 TABLET BY MOUTH EVERY DAY 90 tablet 1  . Vitamins-Lipotropics (B-50) TABS Take 1 tablet by mouth daily.      No facility-administered medications prior to visit.     Allergies  Allergen Reactions  . Hydrocodone-Acetaminophen Other (See Comments)    Slows Respiration Rate Down  . Hydrocodone-Acetaminophen Other (See Comments)    Unknown  Slows Respiration Rate Down   . Codeine     REACTION: upsets stomach  . Demerol     hives  . Hydrocodone-Acetaminophen     REACTION: slows respiratory rate down  . Meperidine Hcl     REACTION: Hives    Review of Systems  Constitutional: Negative for fever.  HENT: Negative for congestion.   Eyes: Negative for blurred vision.  Respiratory: Positive for shortness of breath.   Cardiovascular: Negative for chest pain, palpitations and leg swelling.  Gastrointestinal: Negative for abdominal pain, blood in stool and nausea.  Genitourinary: Negative for dysuria and  frequency.  Musculoskeletal: Negative for falls.  Skin: Positive for itching and rash.  Neurological: Negative for dizziness, loss of consciousness and headaches.  Endo/Heme/Allergies: Negative for environmental allergies.  Psychiatric/Behavioral: Positive for depression. Negative for suicidal ideas. The patient is nervous/anxious and has insomnia.        Objective:    Physical Exam  Constitutional: She is oriented to person, place, and time. She appears well-developed and well-nourished. No distress.  HENT:  Head: Normocephalic and atraumatic.  Nose: Nose normal.  Eyes: Right eye exhibits no discharge. Left eye exhibits no discharge.  Neck: Normal range of motion. Neck supple.  Cardiovascular: Normal rate and regular rhythm.  No murmur heard. Pulmonary/Chest: Effort normal and breath sounds normal.  Abdominal: Soft. Bowel sounds are normal. There is no tenderness.  Musculoskeletal: She exhibits no edema.  Neurological: She is alert and oriented to person, place, and time.  Skin: Skin is warm and dry.  Psychiatric: She has a normal mood and affect.  Nursing note and vitals reviewed.   BP (!) 152/82 (BP Location: Left Arm, Patient Position: Sitting, Cuff Size: Normal)   Pulse (!) 54   Temp 98 F (36.7 C) (Oral)   Resp 18   Wt 126 lb (57.2 kg)   SpO2 96%   BMI 20.34 kg/m  Wt Readings from Last 3 Encounters:  07/19/17 126 lb (57.2 kg)  07/13/17 129 lb 6 oz (58.7 kg)  03/12/17 127 lb 6.4 oz (57.8 kg)   BP Readings from Last 3 Encounters:  07/19/17 (!) 152/82  07/13/17 108/76  03/12/17 134/88     Immunization History  Administered Date(s) Administered  . Influenza Whole 10/07/2009  . Influenza,inj,Quad PF,6+ Mos 01/07/2015  . Influenza-Unspecified 11/07/2015  . Pneumococcal Conjugate-13 12/16/2013  . Tdap 06/21/2011    Health Maintenance  Topic Date Due  . PNA vac Low Risk Adult (2 of 2 - PPSV23) 12/17/2014  . INFLUENZA VACCINE  09/06/2017  . MAMMOGRAM   03/21/2018  . TETANUS/TDAP  06/20/2021  . COLONOSCOPY  01/12/2026  . DEXA SCAN  Completed  . Hepatitis C Screening  Completed    Lab Results  Component Value Date   WBC 4.2 01/23/2017   HGB 12.0 01/23/2017   HCT 36.0 01/23/2017   PLT 167.0 01/23/2017   GLUCOSE 90 01/23/2017   CHOL 200 (H) 01/21/2016   TRIG 79 01/21/2016   HDL 101 01/21/2016   LDLDIRECT 94.8 07/28/2010   LDLCALC 83 01/21/2016   ALT 10 01/23/2017   AST 15 01/23/2017   NA 140 01/23/2017   K 3.6 01/23/2017   CL 104 01/23/2017   CREATININE 0.55 01/23/2017   BUN 16 01/23/2017   CO2 29 01/23/2017   TSH 0.78 01/23/2017    Lab Results  Component Value Date   TSH 0.78 01/23/2017   Lab Results  Component Value Date   WBC 4.2 01/23/2017   HGB 12.0 01/23/2017   HCT 36.0 01/23/2017   MCV 93.8 01/23/2017   PLT 167.0 01/23/2017   Lab Results  Component Value Date   NA 140 01/23/2017   K 3.6 01/23/2017   CO2 29 01/23/2017   GLUCOSE 90 01/23/2017   BUN 16 01/23/2017   CREATININE 0.55 01/23/2017   BILITOT 0.4 01/23/2017   ALKPHOS 152 (H) 01/23/2017   AST 15 01/23/2017   ALT 10 01/23/2017   PROT 6.3 01/23/2017   ALBUMIN 3.9 01/23/2017   CALCIUM 8.7 01/23/2017   GFR 116.35 01/23/2017   Lab Results  Component Value Date   CHOL 200 (H) 01/21/2016   Lab Results  Component Value Date   HDL 101 01/21/2016   Lab Results  Component Value Date   LDLCALC 83 01/21/2016   Lab Results  Component Value Date   TRIG 79 01/21/2016   Lab Results  Component Value Date   CHOLHDL 2.0 01/21/2016   No results found for: HGBA1C       Assessment & Plan:   Problem List Items Addressed This Visit  ESSENTIAL HYPERTENSION, BENIGN    Poorly controlled will alter medications, encouraged DASH diet, minimize caffeine and obtain adequate sleep. Report concerning symptoms and follow up as directed and as needed. Add Metoprolol XR 25 mg po daily      Relevant Medications   metoprolol succinate (TOPROL-XL) 25 MG  24 hr tablet   Insomnia    Encouraged good sleep hygiene such as dark, quiet room. No blue/green glowing lights such as computer screens in bedroom. No alcohol or stimulants in evening. Cut down on caffeine as able. Regular exercise is helpful but not just prior to bed time.       Anxiety    On adrenaline overload, with her mother's recent passing her daughter pregnant as a single parent and more. She is offered a daily SSRI but will hold off for now. Given Alprazolam to use sparingly prn. Spent 25 minutes of a 30 minute visit in counseling      Relevant Medications   ALPRAZolam (XANAX) 0.25 MG tablet   Urticaria    Has resolved at present. Has a few more days of her prednisone to take. She will complete and is given a Medrol dosepak to take if recurs while she is traveling. Encouraged Zyrtec and Zantac bid for next month and as needed. She denies any new products and believes it is stress related         I am having Carrie [name] start on cetirizine, ranitidine, metoprolol succinate, methylPREDNISolone, and ALPRAZolam. I am also having her maintain her B-50, Fish Oil, glucosamine-chondroitin, Calcium-Vitamin D-Vitamin K (CALCIUM SOFT CHEWS PO), Vitamin D3, vitamin C, atenolol, and ranitidine.  Meds ordered this encounter  Medications  . cetirizine (ZYRTEC) 10 MG tablet    Sig: Take 1 tablet (10 mg total) by mouth 2 (two) times daily as needed for allergies.    Dispense:  60 tablet    Refill:  2  . ranitidine (ZANTAC) 150 MG tablet    Sig: Take 1 tablet (150 mg total) by mouth 2 (two) times daily as needed for heartburn.    Dispense:  60 tablet    Refill:  2  . metoprolol succinate (TOPROL-XL) 25 MG 24 hr tablet    Sig: Take 1 tablet (25 mg total) by mouth daily.    Dispense:  30 tablet    Refill:  3  . methylPREDNISolone (MEDROL) 4 MG tablet    Sig: 5 tab po qd X 1d then 4 tab po qd X 1d then 3 tab po qd X 1d then 2 tab po qd then 1 tab po qd    Dispense:  15 tablet     Refill:  0  . ALPRAZolam (XANAX) 0.25 MG tablet    Sig: Take 1 tablet (0.25 mg total) by mouth 2 (two) times daily as needed for anxiety.    Dispense:  10 tablet    Refill:  0    CMA served as scribe during this visit. History, Physical and Plan performed by medical provider. Documentation and orders reviewed and attested to.  Penni Homans, MD

## 2017-07-19 NOTE — Assessment & Plan Note (Signed)
Encouraged good sleep hygiene such as dark, quiet room. No blue/green glowing lights such as computer screens in bedroom. No alcohol or stimulants in evening. Cut down on caffeine as able. Regular exercise is helpful but not just prior to bed time.  

## 2017-07-21 DIAGNOSIS — L509 Urticaria, unspecified: Secondary | ICD-10-CM | POA: Insufficient documentation

## 2017-07-21 NOTE — Assessment & Plan Note (Signed)
Has resolved at present. Has a few more days of her prednisone to take. She will complete and is given a Medrol dosepak to take if recurs while she is traveling. Encouraged Zyrtec and Zantac bid for next month and as needed. She denies any new products and believes it is stress related

## 2017-08-28 ENCOUNTER — Encounter: Payer: Self-pay | Admitting: Allergy and Immunology

## 2017-08-28 ENCOUNTER — Ambulatory Visit (INDEPENDENT_AMBULATORY_CARE_PROVIDER_SITE_OTHER): Payer: Medicare Other | Admitting: Allergy and Immunology

## 2017-08-28 VITALS — BP 110/70 | HR 47 | Resp 16 | Ht 66.0 in | Wt 127.8 lb

## 2017-08-28 DIAGNOSIS — L509 Urticaria, unspecified: Secondary | ICD-10-CM

## 2017-08-28 NOTE — Progress Notes (Signed)
Dear Carrie [name],  Thank you for referring Carrie [name] to the [facility] on 08/28/2017.   Below is a summation of this patient's evaluation and recommendations.  Thank you for your referral. I will keep you informed about this patient's response to treatment.   If you have any questions please do not hesitate to contact me.   Sincerely,  Jiles Prows, MD Allergy / Immunology [place]   ______________________________________________________________________    NEW PATIENT NOTE  Referring Provider: Shelda [name]* Primary Provider: [name], MD Date of office visit: 08/28/2017    Subjective:   Chief Complaint:  Carrie [name] (DOB: December 13, 1945) is a 72 y.o. female who presents to the clinic on 08/28/2017 with a chief complaint of Urticaria and Allergic Reaction (unknown source ) .     HPI: [name] presents to this clinic in evaluation of hives.  Apparently with some unknown etiologic factor she developed immunological hyperreactivity manifested as global urticaria requiring emergency room evaluation on 3 occasions over the course of 7 days.  She was treated with prolonged systemic steroid use which she finished in mid June and ever since that point in time she has not had any urticarial lesions.  Associated with her urticaria was the sensation that she had difficulty swallowing but no other associated systemic or constitutional symptoms.  She did not have any gastrointestinal symptoms or respiratory tract symptoms.  She is not really an atopic individual in general.  There was a history of her developing eye itching when being exposed to horses.  She has not really on any new medications that may have accounted for this immunological hyperactivity.  Her environment did not really change significantly and she did not use any new supplements or  medications.  Past Medical History:  Diagnosis Date  . Breast cancer ([place]) 08/2004   Left breast invasive ductal carcinoma  . CHICKENPOX, HX OF 03/03/2010  . COMMON MIGRAINE 03/03/2010  . DEGENERATIVE DISC DISEASE 03/03/2010  . Essential hypertension, benign 03/03/2010  . Foot pain, bilateral 02/08/2016  . HYPOKALEMIA 03/03/2010  . Increased vitamin B12 level 01/10/2015  . Insomnia 08/01/2016  . Low back pain 08/01/2016  . MEASLES, HX OF 03/03/2010  . Melanoma of skin, site unspecified 03/03/2010  . 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 NEOPLASM, MALIGNANT, BREAST, HX OF 03/03/2010  . OSTEOPENIA 03/03/2010  . PALPITATIONS, HX OF 03/03/2010  . Pulmonary nodules 01/07/2015  . Rib fracture    left, 3 fractures s/p radiation: right side 2 fractured, no surgery    Past Surgical History:  Procedure Laterality Date  . BREAST LUMPECTOMY WITH NEEDLE LOCALIZATION AND AXILLARY SENTINEL LYMPH NODE BX Left 08/2004  . clavicle dislocate  72 yrs old   right, reapproximated w/pins, subsequently removed  . HERNIA REPAIR  05/2012  . left hip traumatic history     hematoma surgically evacuated  . SHOULDER ARTHROSCOPY     b/l shoulder with debridement  . TONSILLECTOMY      Allergies as of 08/28/2017      Reactions   Hydrocodone-acetaminophen Other (See Comments)   Slows Respiration Rate Down   Hydrocodone-acetaminophen Other (See Comments)   Unknown  Slows Respiration Rate Down   Codeine    REACTION: upsets stomach   Demerol    hives   Hydrocodone-acetaminophen    REACTION: slows respiratory rate down   Meperidine Hcl    REACTION: Hives  Medication List      ALPRAZolam 0.25 MG tablet Commonly known as:  XANAX Take 1 tablet (0.25 mg total) by mouth 2 (two) times daily as needed for anxiety.   atenolol 25 MG tablet Commonly known as:  TENORMIN TAKE ONE HALF TABLETS (12.5 MG TOTAL) BY MOUTH DAILY.   B-50 Tabs Take 1 tablet by mouth daily.   CALCIUM SOFT CHEWS PO Take 1 tablet by mouth 2  (two) times daily.   cetirizine 10 MG tablet Commonly known as:  ZYRTEC Take 1 tablet (10 mg total) by mouth 2 (two) times daily as needed for allergies.   EPINEPHrine 0.3 mg/0.3 mL Soaj injection Commonly known as:  EPI-PEN INJECT 0.3 MLS (0.3 MG TOTAL) INTO THE MUSCLE ONCE FOR 1 DOSE.   Fish Oil 1200 MG Caps Take 1,200 mg by mouth 2 (two) times daily.   glucosamine-chondroitin 500-400 MG tablet Take 1 tablet by mouth daily.   metoprolol succinate 25 MG 24 hr tablet Commonly known as:  TOPROL-XL Take 1 tablet (25 mg total) by mouth daily.   ranitidine 150 MG tablet Commonly known as:  ZANTAC Take 1 tablet (150 mg total) by mouth 2 (two) times daily as needed for heartburn.   vitamin C 1000 MG tablet Take 1,000 mg by mouth daily.   Vitamin D3 1000 units Caps Take 1 capsule by mouth daily.       Review of systems negative except as noted in HPI / PMHx or noted below:  Review of Systems  Constitutional: Negative.   HENT: Negative.   Eyes: Negative.   Respiratory: Negative.   Cardiovascular: Negative.   Gastrointestinal: Negative.   Genitourinary: Negative.   Musculoskeletal: Negative.   Skin: Negative.   Neurological: Negative.   Endo/Heme/Allergies: Negative.   Psychiatric/Behavioral: Negative.     Family History  Problem Relation Age of Onset  . Hypertension Mother   . COPD Mother   . Alcohol abuse Father        history of  . Coronary artery disease Father   . Osteoarthritis Brother        knees  . Basal cell carcinoma Sister   . Heart disease Maternal Grandmother   . Stroke Maternal Grandfather   . Osteoarthritis Sister        knees  . Migraines Sister     Social History   Socioeconomic History  . Marital status: Married    Spouse name: Not on file  . Number of children: Not on file  . Years of education: Not on file  . Highest education level: Not on file  Occupational History  . Not on file  Social Needs  . Financial resource strain:  Not on file  . Food insecurity:    Worry: Not on file    Inability: Not on file  . Transportation needs:    Medical: Not on file    Non-medical: Not on file  Tobacco Use  . Smoking status: Former Smoker    Packs/day: 0.25    Last attempt to quit: 02/07/1968    Years since quitting: 49.5  . Smokeless tobacco: Never Used  Substance and Sexual Activity  . Alcohol use: Yes    Comment: 1-2 glasses of wine per day  . Drug use: No  . 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 Physical activity:    Days per week: Not on file  Minutes per session: Not on file  . Stress: Not on file  Relationships  . Social connections:    Talks on phone: Not on file    Gets together: Not on file    Attends religious service: Not on file    Active member of club or organization: Not on file    Attends meetings of clubs or organizations: Not on file    Relationship status: Not on file  . Intimate partner violence:    Fear of current or ex partner: Not on file    Emotionally abused: Not on file    Physically abused: Not on file    Forced sexual activity: Not on file  Other Topics Concern  . Not on file  Social History Narrative  . Not on file    Environmental and Social history  Lives in a house with a dry environment, no animals located inside the household, carpet in the bedroom, no plastic on the bed, no plastic on the pillow, no smokers located inside the household.  Objective:   Vitals:   08/28/17 1437  BP: 110/70  Pulse: (!) 47  Resp: 16  SpO2: 97%   Height: 5\' 6"  (167.6 cm) Weight: 127 lb 12.8 oz (58 kg)  Physical Exam  HENT:  Head: Normocephalic. Head is without right periorbital erythema and without left periorbital erythema.  Right Ear: Tympanic membrane, external ear and ear canal normal.  Left Ear: Tympanic membrane, external ear and ear canal normal.  Nose:  Nose normal. No mucosal edema or rhinorrhea.  Mouth/Throat: Oropharynx is clear and moist and mucous membranes are normal. No oropharyngeal exudate.  Eyes: Pupils are equal, round, and reactive to light. Conjunctivae and lids are normal.  Neck: Trachea normal. No tracheal deviation present. No thyromegaly present.  Cardiovascular: Normal rate, regular rhythm, S1 normal, S2 normal and normal heart sounds.  No murmur heard. Pulmonary/Chest: Effort normal. No stridor. No respiratory distress. She has no wheezes. She has no rales. She exhibits no tenderness.  Abdominal: Soft. She exhibits no distension and no mass. There is no hepatosplenomegaly. There is no tenderness. There is no rebound and no guarding.  Musculoskeletal: She exhibits no edema or tenderness.  Lymphadenopathy:       Head (right side): No tonsillar adenopathy present.       Head (left side): No tonsillar adenopathy present.    She has no cervical adenopathy.    She has no axillary adenopathy.  Neurological: She is alert.  Skin: No rash noted. She is not diaphoretic. No erythema. No pallor. Nails show no clubbing.    Diagnostics: Allergy skin tests were not performed.   Assessment and Plan:    1. Urticaria     Jamirah had some form of immunological hyperreactivity that was relatively transient and short-lived and at this point in time I will assume that this was an isolated event and will not be recurrent and will not require any further evaluation at this point.  If she has recurrent urticaria or other significant evidence of immunological hyperactivity she will contact me and we will need to then have her undergo further evaluation for identification of the etiologic agent responsible for this condition.  Hopefully that will not be the case and this will be an isolated event.  It is very reasonable for her to carry an EpiPen over the course of the next several months until we are sure that her immunological hyperactivity has  completely abated.  Jiles Prows, MD Allergy /  Immunology [facility]

## 2017-08-29 ENCOUNTER — Encounter: Payer: Self-pay | Admitting: Allergy and Immunology

## 2017-09-21 DIAGNOSIS — K5790 Diverticulosis of intestine, part unspecified, without perforation or abscess without bleeding: Secondary | ICD-10-CM | POA: Diagnosis not present

## 2017-09-21 DIAGNOSIS — Z8719 Personal history of other diseases of the digestive system: Secondary | ICD-10-CM | POA: Diagnosis not present

## 2017-09-24 ENCOUNTER — Ambulatory Visit (INDEPENDENT_AMBULATORY_CARE_PROVIDER_SITE_OTHER): Payer: Medicare Other | Admitting: Family Medicine

## 2017-09-24 VITALS — BP 122/70 | HR 55 | Temp 98.2°F | Resp 18 | Wt 125.0 lb

## 2017-09-24 DIAGNOSIS — K5792 Diverticulitis of intestine, part unspecified, without perforation or abscess without bleeding: Secondary | ICD-10-CM

## 2017-09-24 DIAGNOSIS — I10 Essential (primary) hypertension: Secondary | ICD-10-CM | POA: Diagnosis not present

## 2017-09-24 DIAGNOSIS — R748 Abnormal levels of other serum enzymes: Secondary | ICD-10-CM | POA: Diagnosis not present

## 2017-09-24 DIAGNOSIS — F419 Anxiety disorder, unspecified: Secondary | ICD-10-CM | POA: Diagnosis not present

## 2017-09-24 DIAGNOSIS — E538 Deficiency of other specified B group vitamins: Secondary | ICD-10-CM | POA: Diagnosis not present

## 2017-09-24 DIAGNOSIS — Z8679 Personal history of other diseases of the circulatory system: Secondary | ICD-10-CM | POA: Diagnosis not present

## 2017-09-24 MED ORDER — RANITIDINE HCL 150 MG PO TABS: 150.0000 mg | ORAL_TABLET | Freq: Every day | ORAL | 1 refills | Status: DC

## 2017-09-24 MED ORDER — ATENOLOL 25 MG PO TABS: ORAL_TABLET | ORAL | 1 refills | Status: DC

## 2017-09-24 NOTE — Assessment & Plan Note (Signed)
Doing well without any doses of Alprazolam

## 2017-09-24 NOTE — Assessment & Plan Note (Signed)
cmp

## 2017-09-24 NOTE — Assessment & Plan Note (Signed)
Check level 

## 2017-09-24 NOTE — Patient Instructions (Signed)

## 2017-09-24 NOTE — Progress Notes (Signed)
Subjective:  I acted as a Education administrator for Dr. Charlett Blake. [place], [place]  Patient ID: Carrie [name], female    DOB: 1945-02-08, 72 y.o.   MRN: [id number]  No chief complaint on file.   HPI  Patient is in today for a 2 month follow up and she feels well today. While she was on vacation in [place] she had severe abdominal pain and was seen and diagnosed with Diverticulosis. She was treated with antibiotics and her symptoms have all resolved. She is now following with gastroenterology. She otherwise feels well. Her stress level is still high but she is managing well and uses Alprazolam infrequently with adequate results and no side effects. No other acute concerns or hospitalizations. Denies CP/palp/SOB/HA/congestion or GU c/o. Taking meds as prescribed  Patient Care Team: [name], MD as PCP - General (Family Medicine) Justice Britain, MD as Consulting Physician (Orthopedic Surgery) Danella Sensing, MD as Consulting Physician (Dermatology) Richmond Campbell, MD as Consulting Physician (Gastroenterology) Fay Records, MD as Consulting Physician (Cardiology) Linda Hedges, DO as Consulting Physician (Obstetrics and Gynecology)   Past Medical History:  Diagnosis Date  . Breast cancer ([facility]) 08/2004   Left breast invasive ductal carcinoma  . CHICKENPOX, HX OF 03/03/2010  . COMMON MIGRAINE 03/03/2010  . DEGENERATIVE DISC DISEASE 03/03/2010  . Essential hypertension, benign 03/03/2010  . Foot pain, bilateral 02/08/2016  . HYPOKALEMIA 03/03/2010  . Increased vitamin B12 level 01/10/2015  . Insomnia 08/01/2016  . Low back pain 08/01/2016  . MEASLES, HX OF 03/03/2010  . Melanoma of skin, site unspecified 03/03/2010  . 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 NEOPLASM, MALIGNANT, BREAST, HX OF 03/03/2010  . OSTEOPENIA 03/03/2010  . PALPITATIONS, HX OF 03/03/2010  . Pulmonary nodules 01/07/2015  . Rib fracture    left, 3 fractures s/p radiation: right side 2 fractured, no surgery    Past Surgical History:  Procedure  Laterality Date  . BREAST LUMPECTOMY WITH NEEDLE LOCALIZATION AND AXILLARY SENTINEL LYMPH NODE BX Left 08/2004  . clavicle dislocate  72 yrs old   right, reapproximated w/pins, subsequently removed  . HERNIA REPAIR  05/2012  . left hip traumatic history     hematoma surgically evacuated  . SHOULDER ARTHROSCOPY     b/l shoulder with debridement  . TONSILLECTOMY      Family History  Problem Relation Age of Onset  . Hypertension Mother   . COPD Mother   . Alcohol abuse Father        history of  . Coronary artery disease Father   . Osteoarthritis Brother        knees  . Basal cell carcinoma Sister   . Heart disease Maternal Grandmother   . Stroke Maternal Grandfather   . Osteoarthritis Sister        knees  . Migraines Sister     Social History   Socioeconomic History  . Marital status: Married    Spouse name: Not on file  . Number of children: Not on file  . Years of education: Not on file  . Highest education level: Not on file  Occupational History  . Not on file  Social Needs  . Financial resource strain: Not on file  . Food insecurity:    Worry: Not on file    Inability: Not on file  . Transportation needs:    Medical: Not on file    Non-medical: Not on file  Tobacco Use  . Smoking status: Former Smoker    Packs/day: 0.25  Last attempt to quit: 02/07/1968    Years since quitting: 49.6  . Smokeless tobacco: Never Used  Substance and Sexual Activity  . Alcohol use: Yes    Comment: 1-2 glasses of wine per day  . Drug use: No  . 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 Physical activity:    Days per week: Not on file    Minutes per session: Not on file  . Stress: Not on file  Relationships  . Social connections:    Talks on phone: Not on file    Gets together: Not on file    Attends religious service: Not on file    Active member of club or  organization: Not on file    Attends meetings of clubs or organizations: Not on file    Relationship status: Not on file  . Intimate partner violence:    Fear of current or ex partner: Not on file    Emotionally abused: Not on file    Physically abused: Not on file    Forced sexual activity: Not on file  Other Topics Concern  . Not on file  Social History Narrative  . Not on file    Outpatient Medications Prior to Visit  Medication Sig Dispense Refill  . ALPRAZolam (XANAX) 0.25 MG tablet Take 1 tablet (0.25 mg total) by mouth 2 (two) times daily as needed for anxiety. 10 tablet 0  . Ascorbic Acid (VITAMIN C) 1000 MG tablet Take 1,000 mg by mouth daily.    . Calcium-Vitamin D-Vitamin K (CALCIUM SOFT CHEWS PO) Take 1 tablet by mouth 2 (two) times daily.    . Cholecalciferol (VITAMIN D3) 1000 UNITS CAPS Take 1 capsule by mouth daily.    Marland Kitchen EPINEPHrine 0.3 mg/0.3 mL IJ SOAJ injection INJECT 0.3 MLS (0.3 MG TOTAL) INTO THE MUSCLE ONCE FOR 1 DOSE.  1  . glucosamine-chondroitin 500-400 MG tablet Take 1 tablet by mouth daily.      . Lactobacillus (PROBIOTIC ACIDOPHILUS PO) Take by mouth.    . metoprolol succinate (TOPROL-XL) 25 MG 24 hr tablet Take 1 tablet (25 mg total) by mouth daily. 30 tablet 3  . Omega-3 Fatty Acids (FISH OIL) 1200 MG CAPS Take 1,200 mg by mouth 2 (two) times daily.     . Vitamins-Lipotropics (B-50) TABS Take 1 tablet by mouth daily.     Marland Kitchen atenolol (TENORMIN) 25 MG tablet TAKE ONE HALF TABLETS (12.5 MG TOTAL) BY MOUTH DAILY. 90 tablet 0  . ranitidine (ZANTAC) 150 MG tablet Take 1 tablet (150 mg total) by mouth 2 (two) times daily as needed for heartburn. 60 tablet 2  . cetirizine (ZYRTEC) 10 MG tablet Take 1 tablet (10 mg total) by mouth 2 (two) times daily as needed for allergies. (Patient not taking: Reported on 08/28/2017) 60 tablet 2   No facility-administered medications prior to visit.     Allergies  Allergen Reactions  . Hydrocodone-Acetaminophen Other (See  Comments)    Slows Respiration Rate Down  . Hydrocodone-Acetaminophen Other (See Comments)    Unknown  Slows Respiration Rate Down   . Codeine     REACTION: upsets stomach  . Demerol     hives  . Hydrocodone-Acetaminophen     REACTION: slows respiratory rate down  . Meperidine Hcl     REACTION: Hives    Review of Systems  Constitutional: Negative for fever and malaise/fatigue.  HENT: Negative for congestion.   Eyes: Negative for blurred vision.  Respiratory: Negative for shortness of breath.   Cardiovascular: Negative for chest pain, palpitations and leg swelling.  Gastrointestinal: Negative for abdominal pain, blood in stool and nausea.  Genitourinary: Negative for dysuria and frequency.  Musculoskeletal: Negative for falls.  Skin: Negative for rash.  Neurological: Negative for dizziness, loss of consciousness and headaches.  Endo/Heme/Allergies: Negative for environmental allergies.  Psychiatric/Behavioral: Negative for depression. The patient is not nervous/anxious.        Objective:    Physical Exam  Constitutional: She is oriented to person, place, and time. She appears well-developed and well-nourished. No distress.  HENT:  Head: Normocephalic and atraumatic.  Nose: Nose normal.  Eyes: Right eye exhibits no discharge. Left eye exhibits no discharge.  Neck: Normal range of motion. Neck supple.  Cardiovascular: Normal rate and regular rhythm.  No murmur heard. Pulmonary/Chest: Effort normal and breath sounds normal.  Abdominal: Soft. Bowel sounds are normal. There is no tenderness.  Musculoskeletal: She exhibits no edema.  Neurological: She is alert and oriented to person, place, and time.  Skin: Skin is warm and dry.  Psychiatric: She has a normal mood and affect.  Nursing note and vitals reviewed.   BP 122/70 (BP Location: Left Arm, Patient Position: Sitting, Cuff Size: Normal)   Pulse (!) 55   Temp 98.2 F (36.8 C) (Oral)   Resp 18   Wt 125 lb (56.7 kg)    SpO2 96%   BMI 20.18 kg/m  Wt Readings from Last 3 Encounters:  09/24/17 125 lb (56.7 kg)  08/28/17 127 lb 12.8 oz (58 kg)  07/19/17 126 lb (57.2 kg)   BP Readings from Last 3 Encounters:  09/24/17 122/70  08/28/17 110/70  07/19/17 (!) 152/82     Immunization History  Administered Date(s) Administered  . Influenza Whole 10/07/2009  . Influenza,inj,Quad PF,6+ Mos 01/07/2015  . Influenza-Unspecified 11/07/2015  . Pneumococcal Conjugate-13 12/16/2013  . Tdap 06/21/2011    Health Maintenance  Topic Date Due  . PNA vac Low Risk Adult (2 of 2 - PPSV23) 12/17/2014  . INFLUENZA VACCINE  09/06/2017  . MAMMOGRAM  03/21/2018  . TETANUS/TDAP  06/20/2021  . COLONOSCOPY  01/12/2026  . DEXA SCAN  Completed  . Hepatitis C Screening  Completed    Lab Results  Component Value Date   WBC 5.5 09/24/2017   HGB 14.2 09/24/2017   HCT 41.8 09/24/2017   PLT 217.0 09/24/2017   GLUCOSE 88 09/24/2017   CHOL 214 (H) 09/24/2017   TRIG 165.0 (H) 09/24/2017   HDL 79.10 09/24/2017   LDLDIRECT 94.8 07/28/2010   LDLCALC 101 (H) 09/24/2017   ALT 17 09/24/2017   AST 21 09/24/2017   NA 141 09/24/2017   K 4.3 09/24/2017   CL 102 09/24/2017   CREATININE 0.67 09/24/2017   BUN 20 09/24/2017   CO2 31 09/24/2017   TSH 0.66 09/24/2017   HGBA1C 5.5 09/25/2017    Lab Results  Component Value Date   TSH 0.66 09/24/2017   Lab Results  Component Value Date   WBC 5.5 09/24/2017   HGB 14.2 09/24/2017   HCT 41.8 09/24/2017   MCV 101.1 (H) 09/24/2017   PLT 217.0 09/24/2017   Lab Results  Component Value Date   NA 141 09/24/2017   K 4.3 09/24/2017   CO2 31 09/24/2017   GLUCOSE 88 09/24/2017   BUN 20 09/24/2017   CREATININE 0.67 09/24/2017   BILITOT 0.6 09/24/2017  ALKPHOS 121 (H) 09/24/2017   AST 21 09/24/2017   ALT 17 09/24/2017   PROT 6.4 09/24/2017   ALBUMIN 4.2 09/24/2017   CALCIUM 9.8 09/24/2017   GFR 92.47 09/24/2017   Lab Results  Component Value Date   CHOL 214 (H)  09/24/2017   Lab Results  Component Value Date   HDL 79.10 09/24/2017   Lab Results  Component Value Date   LDLCALC 101 (H) 09/24/2017   Lab Results  Component Value Date   TRIG 165.0 (H) 09/24/2017   Lab Results  Component Value Date   CHOLHDL 3 09/24/2017   Lab Results  Component Value Date   HGBA1C 5.5 09/25/2017         Assessment & Plan:   Problem List Items Addressed This Visit    ESSENTIAL HYPERTENSION, BENIGN    Well controlled, no changes to meds. Encouraged heart healthy diet such as the DASH diet and exercise as tolerated. Continue Metropolol Xr 25 mg       Relevant Orders   CBC with Differential/Platelet (Completed)   Comprehensive metabolic panel (Completed)   Lipid panel (Completed)   TSH (Completed)   History of cardiovascular disorder    Check lipid panel      Relevant Orders   Lipid panel (Completed)   Elevated alkaline phosphatase level    cmp      Relevant Orders   Lipid panel (Completed)   Anxiety    Doing well without any doses of Alprazolam      Disorder of vitamin B12 - Primary    Check level       Relevant Orders   Vitamin B12 (Completed)   Diverticulitis    Had a flare while on vacation in [place]. Feels well now. Documentation brought in by patient from [facility] reviewed she is now following with gastoenterology. Discussed need to hydrate and eat lots of fiber. Repot any symptoms early.          I have discontinued Natasa A. Dowdle's atenolol, cetirizine, and atenolol. I am also having her maintain her B-50, Fish Oil, glucosamine-chondroitin, Calcium-Vitamin D-Vitamin K (CALCIUM SOFT CHEWS PO), Vitamin D3, vitamin C, metoprolol succinate, ALPRAZolam, EPINEPHrine, Lactobacillus (PROBIOTIC ACIDOPHILUS PO), and ranitidine.  Meds ordered this encounter  Medications  . DISCONTD: ranitidine (ZANTAC) 150 MG tablet    Sig: Take 1 tablet (150 mg total) by mouth daily.    Dispense:  90 tablet    Refill:  1  . DISCONTD: atenolol  (TENORMIN) 25 MG tablet    Sig: TAKE ONE HALF TABLETS (12.5 MG TOTAL) BY MOUTH DAILY.    Dispense:  90 tablet    Refill:  1  . ranitidine (ZANTAC) 150 MG tablet    Sig: Take 1 tablet (150 mg total) by mouth daily.    Dispense:  90 tablet    Refill:  1    CMA served as Education administrator during this visit. History, Physical and Plan performed by medical provider. Documentation and orders reviewed and attested to.  Penni Homans, MD

## 2017-09-24 NOTE — Assessment & Plan Note (Signed)
Check lipid panel  

## 2017-09-24 NOTE — Assessment & Plan Note (Addendum)
Well controlled, no changes to meds. Encouraged heart healthy diet such as the DASH diet and exercise as tolerated. Continue Metropolol Xr 25 mg

## 2017-09-25 ENCOUNTER — Other Ambulatory Visit (INDEPENDENT_AMBULATORY_CARE_PROVIDER_SITE_OTHER): Payer: Medicare Other

## 2017-09-25 ENCOUNTER — Encounter: Payer: Self-pay | Admitting: Family Medicine

## 2017-09-25 ENCOUNTER — Ambulatory Visit: Payer: Self-pay | Admitting: Allergy and Immunology

## 2017-09-25 DIAGNOSIS — R739 Hyperglycemia, unspecified: Secondary | ICD-10-CM

## 2017-09-25 LAB — CBC WITH DIFFERENTIAL/PLATELET
BASOS ABS: 0 10*3/uL (ref 0.0–0.1)
BASOS PCT: 0.7 % (ref 0.0–3.0)
Eosinophils Absolute: 0.1 10*3/uL (ref 0.0–0.7)
Eosinophils Relative: 1 % (ref 0.0–5.0)
HCT: 41.8 % (ref 36.0–46.0)
Hemoglobin: 14.2 g/dL (ref 12.0–15.0)
LYMPHS ABS: 1.4 10*3/uL (ref 0.7–4.0)
Lymphocytes Relative: 25 % (ref 12.0–46.0)
MCHC: 34 g/dL (ref 30.0–36.0)
MCV: 101.1 fl — AB (ref 78.0–100.0)
MONOS PCT: 6.3 % (ref 3.0–12.0)
Monocytes Absolute: 0.4 10*3/uL (ref 0.1–1.0)
NEUTROS ABS: 3.7 10*3/uL (ref 1.4–7.7)
NEUTROS PCT: 67 % (ref 43.0–77.0)
PLATELETS: 217 10*3/uL (ref 150.0–400.0)
RBC: 4.14 Mil/uL (ref 3.87–5.11)
RDW: 14.1 % (ref 11.5–15.5)
WBC: 5.5 10*3/uL (ref 4.0–10.5)

## 2017-09-25 LAB — COMPREHENSIVE METABOLIC PANEL
ALT: 17 U/L (ref 0–35)
AST: 21 U/L (ref 0–37)
Albumin: 4.2 g/dL (ref 3.5–5.2)
Alkaline Phosphatase: 121 U/L — ABNORMAL HIGH (ref 39–117)
BILIRUBIN TOTAL: 0.6 mg/dL (ref 0.2–1.2)
BUN: 20 mg/dL (ref 6–23)
CALCIUM: 9.8 mg/dL (ref 8.4–10.5)
CHLORIDE: 102 meq/L (ref 96–112)
CO2: 31 meq/L (ref 19–32)
Creatinine, Ser: 0.67 mg/dL (ref 0.40–1.20)
GFR: 92.47 mL/min (ref >60.00)
GLUCOSE: 88 mg/dL (ref 70–99)
POTASSIUM: 4.3 meq/L (ref 3.5–5.1)
Sodium: 141 mEq/L (ref 135–145)
Total Protein: 6.4 g/dL (ref 6.0–8.3)

## 2017-09-25 LAB — VITAMIN B12: Vitamin B-12: 500 pg/mL (ref 211–911)

## 2017-09-25 LAB — LIPID PANEL
CHOL/HDL RATIO: 3
Cholesterol: 214 mg/dL — ABNORMAL HIGH (ref 0–200)
HDL: 79.1 mg/dL (ref >39.00)
LDL CALC: 101 mg/dL — AB (ref 0–99)
NONHDL: 134.46
TRIGLYCERIDES: 165 mg/dL — AB (ref 0.0–149.0)
VLDL: 33 mg/dL (ref 0.0–40.0)

## 2017-09-25 LAB — TSH: TSH: 0.66 u[IU]/mL (ref 0.35–4.50)

## 2017-09-25 LAB — HEMOGLOBIN A1C: Hgb A1c MFr Bld: 5.5 % (ref 4.6–6.5)

## 2017-09-28 ENCOUNTER — Encounter: Payer: Self-pay | Admitting: Family Medicine

## 2017-09-30 DIAGNOSIS — K5792 Diverticulitis of intestine, part unspecified, without perforation or abscess without bleeding: Secondary | ICD-10-CM | POA: Insufficient documentation

## 2017-09-30 NOTE — Assessment & Plan Note (Signed)
Had a flare while on vacation in [place]. Feels well now. Documentation brought in by patient from [facility] reviewed she is now following with gastoenterology. Discussed need to hydrate and eat lots of fiber. Repot any symptoms early.

## 2017-10-03 ENCOUNTER — Encounter: Payer: Self-pay | Admitting: Family Medicine

## 2017-10-05 ENCOUNTER — Telehealth: Payer: Self-pay | Admitting: *Deleted

## 2017-10-05 NOTE — Telephone Encounter (Signed)
ROI faxed to [facility]; release [phone]

## 2017-10-12 ENCOUNTER — Other Ambulatory Visit: Payer: Self-pay | Admitting: Family Medicine

## 2017-10-15 ENCOUNTER — Encounter: Payer: Self-pay | Admitting: Family Medicine

## 2017-10-16 ENCOUNTER — Other Ambulatory Visit: Payer: Self-pay | Admitting: Family Medicine

## 2017-10-16 ENCOUNTER — Encounter: Payer: Self-pay | Admitting: Family Medicine

## 2017-10-16 MED ORDER — ATENOLOL 25 MG PO TABS: ORAL_TABLET | ORAL | 1 refills | Status: DC

## 2017-10-16 NOTE — Telephone Encounter (Signed)
I haven't seen anything from [facility].

## 2017-10-16 NOTE — Telephone Encounter (Signed)
I have not seen anything from [place] [facility]  Have anyone of you?? Please advise

## 2017-10-18 ENCOUNTER — Other Ambulatory Visit: Payer: Self-pay | Admitting: Family Medicine

## 2017-10-18 DIAGNOSIS — R911 Solitary pulmonary nodule: Secondary | ICD-10-CM

## 2017-10-18 DIAGNOSIS — R079 Chest pain, unspecified: Secondary | ICD-10-CM

## 2017-10-18 NOTE — Telephone Encounter (Signed)
Pt called to check the status of getting orders for a CT scan; contact pt when needed

## 2017-10-18 NOTE — Telephone Encounter (Signed)
I have the CT results given them to PCP for review.

## 2017-10-22 ENCOUNTER — Ambulatory Visit (HOSPITAL_BASED_OUTPATIENT_CLINIC_OR_DEPARTMENT_OTHER)
Admission: RE | Admit: 2017-10-22 | Discharge: 2017-10-22 | Disposition: A | Discharge status: Home / Self Care | Payer: Medicare Other | Source: Ambulatory Visit | Attending: Family Medicine | Admitting: Family Medicine

## 2017-10-22 DIAGNOSIS — R079 Chest pain, unspecified: Secondary | ICD-10-CM | POA: Diagnosis not present

## 2017-10-22 DIAGNOSIS — I700 Atherosclerosis of aorta: Secondary | ICD-10-CM | POA: Insufficient documentation

## 2017-10-22 DIAGNOSIS — R911 Solitary pulmonary nodule: Secondary | ICD-10-CM | POA: Diagnosis present

## 2017-10-22 DIAGNOSIS — R918 Other nonspecific abnormal finding of lung field: Secondary | ICD-10-CM | POA: Diagnosis not present

## 2017-10-25 ENCOUNTER — Other Ambulatory Visit: Payer: Self-pay

## 2017-10-25 MED ORDER — CETIRIZINE HCL 10 MG PO TABS: 10.0000 mg | ORAL_TABLET | Freq: Every day | ORAL | 1 refills | Status: DC

## 2017-10-29 DIAGNOSIS — K219 Gastro-esophageal reflux disease without esophagitis: Secondary | ICD-10-CM | POA: Diagnosis not present

## 2017-10-30 DIAGNOSIS — C44519 Basal cell carcinoma of skin of other part of trunk: Secondary | ICD-10-CM | POA: Diagnosis not present

## 2017-10-30 DIAGNOSIS — L570 Actinic keratosis: Secondary | ICD-10-CM | POA: Diagnosis not present

## 2017-10-30 DIAGNOSIS — Z85828 Personal history of other malignant neoplasm of skin: Secondary | ICD-10-CM | POA: Diagnosis not present

## 2017-10-30 DIAGNOSIS — D225 Melanocytic nevi of trunk: Secondary | ICD-10-CM | POA: Diagnosis not present

## 2017-10-30 DIAGNOSIS — D485 Neoplasm of uncertain behavior of skin: Secondary | ICD-10-CM | POA: Diagnosis not present

## 2017-10-30 DIAGNOSIS — D1801 Hemangioma of skin and subcutaneous tissue: Secondary | ICD-10-CM | POA: Diagnosis not present

## 2017-10-30 DIAGNOSIS — L821 Other seborrheic keratosis: Secondary | ICD-10-CM | POA: Diagnosis not present

## 2017-10-30 DIAGNOSIS — Z85820 Personal history of malignant melanoma of skin: Secondary | ICD-10-CM | POA: Diagnosis not present

## 2017-10-31 DIAGNOSIS — Z23 Encounter for immunization: Secondary | ICD-10-CM | POA: Diagnosis not present

## 2017-11-05 ENCOUNTER — Telehealth: Payer: Self-pay | Admitting: *Deleted

## 2017-11-05 NOTE — Telephone Encounter (Signed)
Received Revised Emergency Department Summary from [facility]; forwarded to provider/SLS 09/30

## 2017-11-13 ENCOUNTER — Ambulatory Visit (INDEPENDENT_AMBULATORY_CARE_PROVIDER_SITE_OTHER): Payer: Medicare Other | Admitting: Internal Medicine

## 2017-11-13 ENCOUNTER — Encounter: Payer: Self-pay | Admitting: Internal Medicine

## 2017-11-13 ENCOUNTER — Ambulatory Visit (HOSPITAL_BASED_OUTPATIENT_CLINIC_OR_DEPARTMENT_OTHER)
Admission: RE | Admit: 2017-11-13 | Discharge: 2017-11-13 | Disposition: A | Discharge status: Home / Self Care | Payer: Medicare Other | Source: Ambulatory Visit | Attending: Internal Medicine | Admitting: Internal Medicine

## 2017-11-13 VITALS — BP 126/74 | HR 59 | Temp 97.8°F | Resp 16 | Ht 66.0 in | Wt 125.0 lb

## 2017-11-13 DIAGNOSIS — X58XXXA Exposure to other specified factors, initial encounter: Secondary | ICD-10-CM | POA: Insufficient documentation

## 2017-11-13 DIAGNOSIS — S92515A Nondisplaced fracture of proximal phalanx of left lesser toe(s), initial encounter for closed fracture: Secondary | ICD-10-CM | POA: Diagnosis not present

## 2017-11-13 DIAGNOSIS — S92512A Displaced fracture of proximal phalanx of left lesser toe(s), initial encounter for closed fracture: Secondary | ICD-10-CM | POA: Diagnosis not present

## 2017-11-13 DIAGNOSIS — S92502A Displaced unspecified fracture of left lesser toe(s), initial encounter for closed fracture: Secondary | ICD-10-CM

## 2017-11-13 DIAGNOSIS — S99922A Unspecified injury of left foot, initial encounter: Secondary | ICD-10-CM | POA: Diagnosis not present

## 2017-11-13 NOTE — Progress Notes (Signed)
Subjective:    Patient ID: Carrie [name], female    DOB: 12-08-1945, 72 y.o.   MRN: [id number]  DOS:  11/13/2017 Type of visit - description : Acute Interval history: 3 days ago, at home, she "slammed" the left foot against a door. Since then is having pain on the lateral aspect of the left foot. Initially, the fifth toe looks slightly out of place but that is not the case anymore. She is concerned because she is also having some pain at the plantar aspect of the left foot    Review of Systems Denies other injuries  Past Medical History:  Diagnosis Date  . Breast cancer ([place]) 08/2004   Left breast invasive ductal carcinoma  . CHICKENPOX, HX OF 03/03/2010  . COMMON MIGRAINE 03/03/2010  . DEGENERATIVE DISC DISEASE 03/03/2010  . Essential hypertension, benign 03/03/2010  . Foot pain, bilateral 02/08/2016  . HYPOKALEMIA 03/03/2010  . Increased vitamin B12 level 01/10/2015  . Insomnia 08/01/2016  . Low back pain 08/01/2016  . MEASLES, HX OF 03/03/2010  . Melanoma of skin, site unspecified 03/03/2010  . 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 NEOPLASM, MALIGNANT, BREAST, HX OF 03/03/2010  . OSTEOPENIA 03/03/2010  . PALPITATIONS, HX OF 03/03/2010  . Pulmonary nodules 01/07/2015  . Rib fracture    left, 3 fractures s/p radiation: right side 2 fractured, no surgery    Past Surgical History:  Procedure Laterality Date  . BREAST LUMPECTOMY WITH NEEDLE LOCALIZATION AND AXILLARY SENTINEL LYMPH NODE BX Left 08/2004  . clavicle dislocate  72 yrs old   right, reapproximated w/pins, subsequently removed  . HERNIA REPAIR  05/2012  . left hip traumatic history     hematoma surgically evacuated  . SHOULDER ARTHROSCOPY     b/l shoulder with debridement  . TONSILLECTOMY      Social History   Socioeconomic History  . Marital status: Married    Spouse name: Not on file  . Number of children: Not on file  . Years of education: Not on file  . Highest education level: Not on file  Occupational History    . Not on file  Social Needs  . Financial resource strain: Not on file  . Food insecurity:    Worry: Not on file    Inability: Not on file  . Transportation needs:    Medical: Not on file    Non-medical: Not on file  Tobacco Use  . Smoking status: Former Smoker    Packs/day: 0.25    Last attempt to quit: 02/07/1968    Years since quitting: 49.8  . Smokeless tobacco: Never Used  Substance and Sexual Activity  . Alcohol use: Yes    Comment: 1-2 glasses of wine per day  . Drug use: No  . 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 Physical activity:    Days per week: Not on file    Minutes per session: Not on file  . Stress: Not on file  Relationships  . Social connections:    Talks on phone: Not on file    Gets together: Not on file    Attends religious service: Not on file    Active member of club or organization: Not on file    Attends meetings of clubs or organizations: Not on file    Relationship status: Not on file  . Intimate partner violence:    Fear  of current or ex partner: Not on file    Emotionally abused: Not on file    Physically abused: Not on file    Forced sexual activity: Not on file  Other Topics Concern  . Not on file  Social History Narrative  . Not on file      Allergies as of 11/13/2017      Reactions   Hydrocodone-acetaminophen Other (See Comments)   Slows Respiration Rate Down   Codeine    REACTION: upsets stomach   Demerol    hives   Meperidine Hcl    REACTION: Hives      Medication List        Accurate as of 11/13/17  2:37 PM. Always use your most recent med list.          ALPRAZolam 0.25 MG tablet Commonly known as:  XANAX Take 1 tablet (0.25 mg total) by mouth 2 (two) times daily as needed for anxiety.   B-50 Tabs Take 1 tablet by mouth daily.   CALCIUM SOFT CHEWS PO Take 1 tablet by mouth 2 (two) times  daily.   cetirizine 10 MG tablet Commonly known as:  ZYRTEC Take 1 tablet (10 mg total) by mouth daily.   EPINEPHrine 0.3 mg/0.3 mL Soaj injection Commonly known as:  EPI-PEN INJECT 0.3 MLS (0.3 MG TOTAL) INTO THE MUSCLE ONCE FOR 1 DOSE.   Fish Oil 1200 MG Caps Take 1,200 mg by mouth 2 (two) times daily.   glucosamine-chondroitin 500-400 MG tablet Take 1 tablet by mouth daily.   metoprolol succinate 25 MG 24 hr tablet Commonly known as:  TOPROL-XL TAKE 1 TABLET BY MOUTH EVERY DAY   omeprazole 20 MG capsule Commonly known as:  PRILOSEC Take 20 mg by mouth daily.   PROBIOTIC ACIDOPHILUS PO Take by mouth.   ranitidine 150 MG tablet Commonly known as:  ZANTAC Take 1 tablet (150 mg total) by mouth daily.   vitamin C 1000 MG tablet Take 1,000 mg by mouth daily.   Vitamin D3 1000 units Caps Take 1 capsule by mouth daily.          Objective:   Physical Exam  Musculoskeletal:       Feet:   BP 126/74 (BP Location: Right Arm, Patient Position: Sitting, Cuff Size: Small)   Pulse (!) 59   Temp 97.8 F (36.6 C) (Oral)   Resp 16   Ht 5\' 6"  (1.676 m)   Wt 125 lb (56.7 kg)   SpO2 98%   BMI 20.18 kg/m  General:   Well developed, NAD, see BMI.  HEENT:  Normocephalic . Face symmetric, atraumatic MSK: See graphic. Neurologic:  alert & oriented X3.  Speech normal, gait appropriate for age and unassisted Psych--  Cognition and judgment appear intact.  Cooperative with normal attention span and concentration.  Behavior appropriate. No anxious or depressed appearing.      Assessment & Plan:   Injury, left foot: She is tender at the fifth toe but also proximally, get a x-ray.  Further advised with results. Pain is mild, does not require Tylenol so far.

## 2017-11-13 NOTE — Progress Notes (Signed)
Pre visit review using our clinic review tool, if applicable. No additional management support is needed unless otherwise documented below in the visit note. 

## 2017-11-13 NOTE — Patient Instructions (Addendum)
    STOP BY THE FIRST FLOOR:  get the XR    Will call you with the results as soon as we can

## 2017-11-15 ENCOUNTER — Encounter (INDEPENDENT_AMBULATORY_CARE_PROVIDER_SITE_OTHER): Payer: Self-pay | Admitting: Orthopedic Surgery

## 2017-11-15 ENCOUNTER — Ambulatory Visit (INDEPENDENT_AMBULATORY_CARE_PROVIDER_SITE_OTHER): Payer: Medicare Other | Admitting: Orthopedic Surgery

## 2017-11-15 VITALS — Ht 66.0 in | Wt 125.0 lb

## 2017-11-15 DIAGNOSIS — S92515A Nondisplaced fracture of proximal phalanx of left lesser toe(s), initial encounter for closed fracture: Secondary | ICD-10-CM

## 2017-11-15 NOTE — Progress Notes (Signed)
Office Visit Note   Patient: Carrie [name]           Date of Birth: 03/04/1945           MRN: [id number] Visit Date: 11/15/2017              Requested by: [name], MD [address] [place],  [id number] PCP: [name], MD  Chief Complaint  Patient presents with  . Left Foot - Fracture      HPI: Patient is a 72-year-old woman who presents with a acute fracture of the left little toe she states she reduced it has pain with weightbearing she is currently in regular shoes.  Assessment & Plan: Visit Diagnoses:  1. Closed nondisplaced fracture of distal phalanx of lesser toe of left foot, initial encounter     Plan: Recommend a stiff soled walking sneaker for 4 weeks and then advance to regular shoewear.  Follow-Up Instructions: Return if symptoms worsen or fail to improve.   Ortho Exam  Patient is alert, oriented, no adenopathy, well-dressed, normal affect, normal respiratory effort. Examination patient has good pulses good ankle good subtalar motion there is no rotational deformity or angular deformity to the little toe.  There is some bruising and ecchymosis on the plantar aspect of the toe.  Review of the radiographs shows a nondisplaced fracture of the proximal phalanx left little toe.  Imaging: No results found. No images are attached to the encounter.  Labs: Lab Results  Component Value Date   HGBA1C 5.5 09/25/2017     Lab Results  Component Value Date   ALBUMIN 4.2 09/24/2017   ALBUMIN 3.9 01/23/2017   ALBUMIN 3.9 01/21/2016    Body mass index is 20.18 kg/m.  Orders:  No orders of the defined types were placed in this encounter.  No orders of the defined types were placed in this encounter.    Procedures: No procedures performed  Clinical Data: No additional findings.  ROS:  All other systems negative, except as noted in the HPI. Review of Systems  Objective: Vital Signs: Ht 5\' 6"  (1.676 m)   Wt 125 lb  (56.7 kg)   BMI 20.18 kg/m   Specialty Comments:  No specialty comments available.  PMFS History: Patient Active Problem List   Diagnosis Date Noted  . Diverticulitis 09/30/2017  . Disorder of vitamin B12 09/24/2017  . Urticaria 07/21/2017  . Anxiety 07/19/2017  . Elevated alkaline phosphatase level 01/24/2017  . Insomnia 08/01/2016  . Low back pain radiating to right leg 08/01/2016  . Foot pain, bilateral 02/08/2016  . Squamous cell carcinoma 01/21/2016  . Pain in joint, ankle and foot 01/21/2016  . Pulmonary nodules 01/07/2015  . PVC's (premature ventricular contractions) 01/20/2014  . Spider veins 02/26/2013  . GERD (gastroesophageal reflux disease) 01/08/2012  . Preventative health care 07/28/2010  . MUMPS ENCEPHALITIS 03/03/2010  . Melanoma of skin ([place]) 03/03/2010  . HYPOKALEMIA 03/03/2010  . Migraine without aura 03/03/2010  . ESSENTIAL HYPERTENSION, BENIGN 03/03/2010  . DEGENERATIVE DISC DISEASE 03/03/2010  . Disorder of bone and cartilage 03/03/2010  . NEOPLASM, MALIGNANT, BREAST, HX OF 03/03/2010  . MEASLES, HX OF 03/03/2010  . History of cardiovascular disorder 03/03/2010  . CHICKENPOX, HX OF 03/03/2010   Past Medical History:  Diagnosis Date  . Breast cancer ([place]) 08/2004   Left breast invasive ductal carcinoma  . CHICKENPOX, HX OF 03/03/2010  . COMMON MIGRAINE 03/03/2010  . DEGENERATIVE DISC DISEASE 03/03/2010  .  Essential hypertension, benign 03/03/2010  . Foot pain, bilateral 02/08/2016  . HYPOKALEMIA 03/03/2010  . Increased vitamin B12 level 01/10/2015  . Insomnia 08/01/2016  . Low back pain 08/01/2016  . MEASLES, HX OF 03/03/2010  . Melanoma of skin, site unspecified 03/03/2010  . 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 NEOPLASM, MALIGNANT, BREAST, HX OF 03/03/2010  . OSTEOPENIA 03/03/2010  . PALPITATIONS, HX OF 03/03/2010  . Pulmonary nodules 01/07/2015  . Rib fracture    left, 3 fractures s/p radiation: right side 2 fractured, no surgery    Family History    Problem Relation Age of Onset  . Hypertension Mother   . COPD Mother   . Alcohol abuse Father        history of  . Coronary artery disease Father   . Osteoarthritis Brother        knees  . Basal cell carcinoma Sister   . Heart disease Maternal Grandmother   . Stroke Maternal Grandfather   . Osteoarthritis Sister        knees  . Migraines Sister     Past Surgical History:  Procedure Laterality Date  . BREAST LUMPECTOMY WITH NEEDLE LOCALIZATION AND AXILLARY SENTINEL LYMPH NODE BX Left 08/2004  . clavicle dislocate  72 yrs old   right, reapproximated w/pins, subsequently removed  . HERNIA REPAIR  05/2012  . left hip traumatic history     hematoma surgically evacuated  . SHOULDER ARTHROSCOPY     b/l shoulder with debridement  . TONSILLECTOMY     Social History   Occupational History  . Not on file  Tobacco Use  . Smoking status: Former Smoker    Packs/day: 0.25    Last attempt to quit: 02/07/1968    Years since quitting: 49.8  . Smokeless tobacco: Never Used  Substance and Sexual Activity  . Alcohol use: Yes    Comment: 1-2 glasses of wine per day  . Drug use: No  . 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

## 2017-12-24 ENCOUNTER — Other Ambulatory Visit: Payer: Self-pay

## 2018-01-21 DIAGNOSIS — L259 Unspecified contact dermatitis, unspecified cause: Secondary | ICD-10-CM | POA: Diagnosis not present

## 2018-01-23 NOTE — Progress Notes (Deleted)
Subjective:   Carrie [name] is a 72 y.o. female who presents for Medicare Annual (Subsequent) preventive examination.  Review of Systems: No ROS.  Medicare Wellness Visit. Additional risk factors are reflected in the social history.   Sleep patterns: Home Safety/Smoke Alarms: Feels safe in home. Smoke alarms in place.   Female:      Mammo-       Dexa scan- utd       CCS-01/13/16- normal. Recall 10 yrs     Objective:     Vitals: There were no vitals taken for this visit.  There is no height or weight on file to calculate BMI.  Advanced Directives 01/23/2017 01/21/2016  Does Patient Have a Medical Advance Directive? Yes;No Yes  Type of Paramedic of [place];Living will [place];Living will  Copy of [place] in Chart? No - copy requested No - copy requested    Tobacco Social History   Tobacco Use  Smoking Status Former Smoker  . Packs/day: 0.25  . Last attempt to quit: 02/07/1968  . Years since quitting: 49.9  Smokeless Tobacco Never Used     Counseling given: Not Answered   Clinical Intake:                       Past Medical History:  Diagnosis Date  . Breast cancer ([place]) 08/2004   Left breast invasive ductal carcinoma  . CHICKENPOX, HX OF 03/03/2010  . COMMON MIGRAINE 03/03/2010  . DEGENERATIVE DISC DISEASE 03/03/2010  . Essential hypertension, benign 03/03/2010  . Foot pain, bilateral 02/08/2016  . HYPOKALEMIA 03/03/2010  . Increased vitamin B12 level 01/10/2015  . Insomnia 08/01/2016  . Low back pain 08/01/2016  . MEASLES, HX OF 03/03/2010  . Melanoma of skin, site unspecified 03/03/2010  . 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 NEOPLASM, MALIGNANT, BREAST, HX OF 03/03/2010  . OSTEOPENIA 03/03/2010  . PALPITATIONS, HX OF 03/03/2010  . Pulmonary nodules 01/07/2015  . Rib fracture    left, 3 fractures s/p radiation: right side 2 fractured, no surgery   Past Surgical History:  Procedure  Laterality Date  . BREAST LUMPECTOMY WITH NEEDLE LOCALIZATION AND AXILLARY SENTINEL LYMPH NODE BX Left 08/2004  . clavicle dislocate  72 yrs old   right, reapproximated w/pins, subsequently removed  . HERNIA REPAIR  05/2012  . left hip traumatic history     hematoma surgically evacuated  . SHOULDER ARTHROSCOPY     b/l shoulder with debridement  . TONSILLECTOMY     Family History  Problem Relation Age of Onset  . Hypertension Mother   . COPD Mother   . Alcohol abuse Father        history of  . Coronary artery disease Father   . Osteoarthritis Brother        knees  . Basal cell carcinoma Sister   . Heart disease Maternal Grandmother   . Stroke Maternal Grandfather   . Osteoarthritis Sister        knees  . Migraines Sister    Social History   Socioeconomic History  . Marital status: Married    Spouse name: Not on file  . Number of children: Not on file  . Years of education: Not on file  . Highest education level: Not on file  Occupational History  . Not on file  Social Needs  . Financial resource strain: Not on file  . Food insecurity:    Worry: Not on  file    Inability: Not on file  . Transportation needs:    Medical: Not on file    Non-medical: Not on file  Tobacco Use  . Smoking status: Former Smoker    Packs/day: 0.25    Last attempt to quit: 02/07/1968    Years since quitting: 49.9  . Smokeless tobacco: Never Used  Substance and Sexual Activity  . Alcohol use: Yes    Comment: 1-2 glasses of wine per day  . Drug use: No  . 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 Physical activity:    Days per week: Not on file    Minutes per session: Not on file  . Stress: Not on file  Relationships  . Social connections:    Talks on phone: Not on file    Gets together: Not on file    Attends religious service: Not on file    Active member of club or  organization: Not on file    Attends meetings of clubs or organizations: Not on file    Relationship status: Not on file  Other Topics Concern  . Not on file  Social History Narrative  . Not on file    Outpatient Encounter Medications as of 01/24/2018  Medication Sig  . ALPRAZolam (XANAX) 0.25 MG tablet Take 1 tablet (0.25 mg total) by mouth 2 (two) times daily as needed for anxiety.  . Ascorbic Acid (VITAMIN C) 1000 MG tablet Take 1,000 mg by mouth daily.  . Calcium-Vitamin D-Vitamin K (CALCIUM SOFT CHEWS PO) Take 1 tablet by mouth 2 (two) times daily.  . cetirizine (ZYRTEC) 10 MG tablet Take 1 tablet (10 mg total) by mouth daily.  . Cholecalciferol (VITAMIN D3) 1000 UNITS CAPS Take 1 capsule by mouth daily.  Marland Kitchen EPINEPHrine 0.3 mg/0.3 mL IJ SOAJ injection INJECT 0.3 MLS (0.3 MG TOTAL) INTO THE MUSCLE ONCE FOR 1 DOSE.  Marland Kitchen glucosamine-chondroitin 500-400 MG tablet Take 1 tablet by mouth daily.    . Lactobacillus (PROBIOTIC ACIDOPHILUS PO) Take by mouth.  . metoprolol succinate (TOPROL-XL) 25 MG 24 hr tablet TAKE 1 TABLET BY MOUTH EVERY DAY  . Omega-3 Fatty Acids (FISH OIL) 1200 MG CAPS Take 1,200 mg by mouth 2 (two) times daily.   Marland Kitchen omeprazole (PRILOSEC) 20 MG capsule Take 20 mg by mouth daily.  . ranitidine (ZANTAC) 150 MG tablet Take 1 tablet (150 mg total) by mouth daily.  . Vitamins-Lipotropics (B-50) TABS Take 1 tablet by mouth daily.    No facility-administered encounter medications on file as of 01/24/2018.     Activities of Daily Living In your present state of health, do you have any difficulty performing the following activities: 01/23/2017  Hearing? N  Vision? N  Comment wears contacts. Dr.Byrnes yearly.  Difficulty concentrating or making decisions? N  Walking or climbing stairs? N  Dressing or bathing? N  Doing errands, shopping? N  Preparing Food and eating ? N  Using the Toilet? N  In the past six months, have you accidently leaked urine? N  Do you have problems  with loss of bowel control? N  Managing your Medications? N  Managing your Finances? N  Housekeeping or managing your Housekeeping? N  Some recent data might be hidden    Patient Care Team: [name], MD as PCP - General (Family Medicine) Justice Britain, MD as Consulting Physician (Orthopedic Surgery) Danella Sensing,  MD as Consulting Physician (Dermatology) Richmond Campbell, MD as Consulting Physician (Gastroenterology) Fay Records, MD as Consulting Physician (Cardiology) Linda Hedges, DO as Consulting Physician (Obstetrics and Gynecology)    Assessment:   This is a routine wellness examination for [place]. Physical assessment deferred to PCP.  Exercise Activities and Dietary recommendations   Diet (meal preparation, eat out, water intake, caffeinated beverages, dairy products, fruits and vegetables): {Desc; diets:16563} Breakfast: Lunch:  Dinner:      Goals    . Healthy Lifestyle     Continue to eat heart healthy diet (full of fruits, vegetables, whole grains, lean protein, water--limit salt, fat, and sugar intake) and increase physical activity as tolerated. Continue doing brain stimulating activities (puzzles, reading, adult coloring books, staying active) to keep memory sharp.      . Maintain current health (pt-stated)       Fall Risk Fall Risk  01/23/2017 01/21/2016 07/13/2015 07/03/2014  Falls in the past year? No No No No   Depression Screen PHQ 2/9 Scores 01/23/2017 01/21/2016 07/13/2015 07/03/2014  PHQ - 2 Score 0 0 0 0     Cognitive Function MMSE - Mini Mental State Exam 01/21/2016  Orientation to time 5  Orientation to Place 5  Registration 3  Attention/ Calculation 5  Recall 3  Language- name 2 objects 2  Language- repeat 1  Language- follow 3 step command 3  Language- read & follow direction 1  Write a sentence 1  Copy design 1  Total score 30        Immunization History  Administered Date(s) Administered  . Influenza Whole 10/07/2009  .  Influenza, High Dose Seasonal PF 10/31/2017  . Influenza,inj,Quad PF,6+ Mos 01/07/2015  . Influenza-Unspecified 11/07/2015  . Pneumococcal Conjugate-13 12/16/2013  . Tdap 06/21/2011    Screening Tests Health Maintenance  Topic Date Due  . PNA vac Low Risk Adult (2 of 2 - PPSV23) 12/17/2014  . MAMMOGRAM  03/21/2018  . TETANUS/TDAP  06/20/2021  . COLONOSCOPY  01/12/2026  . INFLUENZA VACCINE  Completed  . DEXA SCAN  Completed  . Hepatitis C Screening  Completed      Plan:   ***   I have personally reviewed and noted the following in the patient's chart:   . Medical and social history . Use of alcohol, tobacco or illicit drugs  . Current medications and supplements . Functional ability and status . Nutritional status . Physical activity . Advanced directives . List of other physicians . Hospitalizations, surgeries, and ER visits in previous 12 months . Vitals . Screenings to include cognitive, depression, and falls . Referrals and appointments  In addition, I have reviewed and discussed with patient certain preventive protocols, quality metrics, and best practice recommendations. A written personalized care plan for preventive services as well as general preventive health recommendations were provided to patient.     Shela Nevin, [place]  01/23/2018

## 2018-01-24 ENCOUNTER — Ambulatory Visit (INDEPENDENT_AMBULATORY_CARE_PROVIDER_SITE_OTHER): Payer: Medicare Other | Admitting: Family Medicine

## 2018-01-24 ENCOUNTER — Encounter: Payer: Self-pay | Admitting: Family Medicine

## 2018-01-24 ENCOUNTER — Ambulatory Visit: Payer: Medicare Other | Admitting: *Deleted

## 2018-01-24 VITALS — BP 118/70 | HR 73 | Temp 97.6°F | Ht 66.0 in | Wt 126.2 lb

## 2018-01-24 DIAGNOSIS — B9689 Other specified bacterial agents as the cause of diseases classified elsewhere: Secondary | ICD-10-CM

## 2018-01-24 DIAGNOSIS — A084 Viral intestinal infection, unspecified: Secondary | ICD-10-CM | POA: Diagnosis not present

## 2018-01-24 DIAGNOSIS — J208 Acute bronchitis due to other specified organisms: Secondary | ICD-10-CM

## 2018-01-24 MED ORDER — DOXYCYCLINE HYCLATE 100 MG PO TABS: 100.0000 mg | ORAL_TABLET | Freq: Two times a day (BID) | ORAL | 0 refills | Status: AC

## 2018-01-24 NOTE — Patient Instructions (Addendum)
Continue to push fluids, practice good hand hygiene, and cover your mouth if you cough.  If you start having increasing fevers, shaking or shortness of breath, seek immediate care.  Stay hydrated with low calorie Gatorade/Powerade or Pedialyte as long as you are having diarrhea.  OK to take Tylenol 1000 mg (2 extra strength tabs) or 975 mg (3 regular strength tabs) every 6 hours as needed.  Take medicine with food.   Let us know if you need anything.

## 2018-01-24 NOTE — Progress Notes (Signed)
Chief Complaint  Patient presents with  . Cough    nasal and chest congestion  . Nausea  . Diarrhea  . Fever    Carrie [name] here for URI complaints.  Duration: 10 d Associated symptoms: Fever (102 F), sinus congestion, rhinorrhea, ear fullness, shortness of breath, chest tightness and myalgia Denies: sinus pain, itchy watery eyes, ear pain, ear drainage, sore throat and wheezing Treatment to date: Tylenol Sick contacts: Yes   1 d ago started having diarrhea, nausea and decreased appetite. No bleeding, abd pain, vomiting. Is trying to stay hydrated.  ROS:  Const: +fevers HEENT: As noted in HPI Lungs: No SOB  Past Medical History:  Diagnosis Date  . Breast cancer ([place]) 08/2004   Left breast invasive ductal carcinoma  . CHICKENPOX, HX OF 03/03/2010  . COMMON MIGRAINE 03/03/2010  . DEGENERATIVE DISC DISEASE 03/03/2010  . Essential hypertension, benign 03/03/2010  . Foot pain, bilateral 02/08/2016  . HYPOKALEMIA 03/03/2010  . Increased vitamin B12 level 01/10/2015  . Insomnia 08/01/2016  . Low back pain 08/01/2016  . MEASLES, HX OF 03/03/2010  . Melanoma of skin, site unspecified 03/03/2010  . 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 NEOPLASM, MALIGNANT, BREAST, HX OF 03/03/2010  . OSTEOPENIA 03/03/2010  . PALPITATIONS, HX OF 03/03/2010  . Pulmonary nodules 01/07/2015  . Rib fracture    left, 3 fractures s/p radiation: right side 2 fractured, no surgery    BP 118/70 (BP Location: Left Arm, Patient Position: Sitting, Cuff Size: Normal)   Pulse 73   Temp 97.6 F (36.4 C) (Oral)   Ht 5\' 6"  (1.676 m)   Wt 126 lb 4 oz (57.3 kg)   SpO2 96%   BMI 20.38 kg/m  General: Awake, alert, appears stated age HEENT: AT, [name], ears patent b/l and TM's neg, nares patent w/o discharge, +ttp over max sinuses b/l, pharynx pink and without exudates, MMM Neck: No masses or asymmetry Heart: RRR Lungs: CTAB, no accessory muscle use Abd: BS+, soft, NT, ND Psych: Age appropriate judgment and insight, normal  mood and affect  Acute bacterial bronchitis - Plan: doxycycline (VIBRA-TABS) 100 MG tablet  Viral gastroenteritis  Orders as above. Continue to push fluids, practice good hand hygiene, cover mouth when coughing. If still having diarrhea on Mon, will need to ck stool.  F/u prn. If starting to experience fevers, shaking, or shortness of breath, seek immediate care. Pt voiced understanding and agreement to the plan.  [place], DO 01/24/18 2:27 PM

## 2018-01-24 NOTE — Progress Notes (Signed)
Pre visit review using our clinic review tool, if applicable. No additional management support is needed unless otherwise documented below in the visit note. 

## 2018-01-25 ENCOUNTER — Telehealth: Payer: Self-pay | Admitting: Family Medicine

## 2018-01-25 NOTE — Telephone Encounter (Signed)
Pt seen yesterday. Requesting nausea medication.  Copied from [place] [id number]. Topic: Quick Communication - See Telephone Encounter >> Jan 25, 2018  8:39 AM Blase Mess A wrote: CRM for notification. See Telephone encounter for: 01/25/18.  Patient saw Dr. Nani Ravens yesterday. Dr. Nani Ravens asked the patient if she wanted an anti nausea medication. The patient declined.  At this time, the patient is calling to request nausea medication.            Pharmacy- [id number] - [place], [place]           [phone] (Phone) [phone] (Fax)

## 2018-01-25 NOTE — Telephone Encounter (Signed)
Copied from [place] [id number]. Topic: Quick Communication - See Telephone Encounter >> Jan 25, 2018  8:39 AM Blase Mess A wrote: CRM for notification. See Telephone encounter for: 01/25/18.  Patient saw Dr. Nani Ravens yesterday. Dr. Nani Ravens asked the patient if she wanted an anti nausea medication. The patient declined.  At this time, the patient is calling to request nausea medication.  Pharmacy- [id number] - [place], [place] [phone] (Phone) [phone] (Fax)

## 2018-01-28 MED ORDER — ONDANSETRON HCL 4 MG PO TABS: 4.0000 mg | ORAL_TABLET | Freq: Three times a day (TID) | ORAL | 0 refills | Status: DC | PRN

## 2018-01-28 NOTE — Telephone Encounter (Signed)
The patient informed and doing better today.

## 2018-01-28 NOTE — Telephone Encounter (Signed)
Sent. TY

## 2018-01-28 NOTE — Addendum Note (Signed)
Addended by: Ames Coupe on: 01/28/2018 09:15 AM   Modules accepted: Orders

## 2018-02-25 ENCOUNTER — Encounter: Payer: Medicare Other | Admitting: Family Medicine

## 2018-03-18 NOTE — Progress Notes (Addendum)
Subjective:   Carrie [name] is a 73 y.o. female who presents for Medicare Annual (Subsequent) preventive examination.  Serves in church and choir. Plays bells.  Review of Systems: No ROS.  Medicare Wellness Visit. Additional risk factors are reflected in the social history. Cardiac Risk Factors include: advanced age (>63men, >76 women);hypertension Sleep patterns: Wakes often to urinate. Does kegels.    Home Safety/Smoke Alarms: Feels safe in home. Smoke alarms in place.  Lives with husband in 2 story home.   Female:        Mammo-pt states scheduled for Feb       Dexa scan-   pt states scheduled for Feb           CCS- 01/13/16. Normal. Recall 10 yrs    Objective:     Vitals: BP 116/70 (BP Location: Left Arm, Patient Position: Sitting, Cuff Size: Normal)   Pulse (!) 58   Ht 5\' 6"  (1.676 m)   Wt 132 lb (59.9 kg)   SpO2 96%   BMI 21.31 kg/m   Body mass index is 21.31 kg/m.  Advanced Directives 03/19/2018 01/23/2017 01/21/2016  Does Patient Have a Medical Advance Directive? Yes Yes;No Yes  Type of Paramedic of [place];Living will [place];Living will [place];Living will  Does patient want to make changes to medical advance directive? No - Patient declined - -  Copy of [place] in Chart? No - copy requested No - copy requested No - copy requested    Tobacco Social History   Tobacco Use  Smoking Status Former Smoker  . Packs/day: 0.25  . Last attempt to quit: 02/07/1968  . Years since quitting: 50.1  Smokeless Tobacco Never Used     Counseling given: Not Answered   Clinical Intake:  Pain : No/denies pain   Past Medical History:  Diagnosis Date  . Breast cancer ([place]) 08/2004   Left breast invasive ductal carcinoma  . CHICKENPOX, HX OF 03/03/2010  . COMMON MIGRAINE 03/03/2010  . DEGENERATIVE DISC DISEASE 03/03/2010  . Essential hypertension, benign 03/03/2010  . Foot pain,  bilateral 02/08/2016  . HYPOKALEMIA 03/03/2010  . Increased vitamin B12 level 01/10/2015  . Insomnia 08/01/2016  . Low back pain 08/01/2016  . MEASLES, HX OF 03/03/2010  . Melanoma of skin, site unspecified 03/03/2010  . 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 NEOPLASM, MALIGNANT, BREAST, HX OF 03/03/2010  . OSTEOPENIA 03/03/2010  . PALPITATIONS, HX OF 03/03/2010  . Pulmonary nodules 01/07/2015  . Rib fracture    left, 3 fractures s/p radiation: right side 2 fractured, no surgery   Past Surgical History:  Procedure Laterality Date  . BREAST LUMPECTOMY WITH NEEDLE LOCALIZATION AND AXILLARY SENTINEL LYMPH NODE BX Left 08/2004  . clavicle dislocate  73 yrs old   right, reapproximated w/pins, subsequently removed  . HERNIA REPAIR  05/2012  . left hip traumatic history     hematoma surgically evacuated  . SHOULDER ARTHROSCOPY     b/l shoulder with debridement  . TONSILLECTOMY     Family History  Problem Relation Age of Onset  . Hypertension Mother   . COPD Mother   . Alcohol abuse Father        history of  . Coronary artery disease Father   . Osteoarthritis Brother        knees  . Basal cell carcinoma Sister   . Heart disease Maternal Grandmother   . Stroke Maternal Grandfather   .  Osteoarthritis Sister        knees  . Migraines Sister    Social History   Socioeconomic History  . Marital status: Married    Spouse name: Not on file  . Number of children: Not on file  . Years of education: Not on file  . Highest education level: Not on file  Occupational History  . Not on file  Social Needs  . Financial resource strain: Not on file  . Food insecurity:    Worry: Not on file    Inability: Not on file  . Transportation needs:    Medical: Not on file    Non-medical: Not on file  Tobacco Use  . Smoking status: Former Smoker    Packs/day: 0.25    Last attempt to quit: 02/07/1968    Years since quitting: 50.1  . Smokeless tobacco: Never Used  Substance and Sexual Activity  . Alcohol  use: Yes    Comment: 1-2 glasses of wine per day  . Drug use: No  . 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 Physical activity:    Days per week: Not on file    Minutes per session: Not on file  . Stress: Not on file  Relationships  . Social connections:    Talks on phone: Not on file    Gets together: Not on file    Attends religious service: Not on file    Active member of club or organization: Not on file    Attends meetings of clubs or organizations: Not on file    Relationship status: Not on file  Other Topics Concern  . Not on file  Social History Narrative  . Not on file    Outpatient Encounter Medications as of 03/19/2018  Medication Sig  . Ascorbic Acid (VITAMIN C) 1000 MG tablet Take 1,000 mg by mouth daily.  . Calcium-Vitamin D-Vitamin K (CALCIUM SOFT CHEWS PO) Take 1 tablet by mouth 2 (two) times daily.  . Cholecalciferol (VITAMIN D3) 1000 UNITS CAPS Take 1 capsule by mouth daily.  Marland Kitchen glucosamine-chondroitin 500-400 MG tablet Take 1 tablet by mouth daily.    . Lactobacillus (PROBIOTIC ACIDOPHILUS PO) Take by mouth.  . metoprolol succinate (TOPROL-XL) 25 MG 24 hr tablet TAKE 1 TABLET BY MOUTH EVERY DAY  . omeprazole (PRILOSEC) 20 MG capsule Take 20 mg by mouth daily.  . Vitamins-Lipotropics (B-50) TABS Take 1 tablet by mouth daily.   Marland Kitchen EPINEPHrine 0.3 mg/0.3 mL IJ SOAJ injection INJECT 0.3 MLS (0.3 MG TOTAL) INTO THE MUSCLE ONCE FOR 1 DOSE.  . [DISCONTINUED] ALPRAZolam (XANAX) 0.25 MG tablet Take 1 tablet (0.25 mg total) by mouth 2 (two) times daily as needed for anxiety.  . [DISCONTINUED] cetirizine (ZYRTEC) 10 MG tablet Take 1 tablet (10 mg total) by mouth daily.  . [DISCONTINUED] Omega-3 Fatty Acids (FISH OIL) 1200 MG CAPS Take 1,200 mg by mouth 2 (two) times daily.   . [DISCONTINUED] ondansetron (ZOFRAN) 4 MG tablet Take 1 tablet (4 mg total) by mouth  every 8 (eight) hours as needed.  . [DISCONTINUED] ranitidine (ZANTAC) 150 MG tablet Take 1 tablet (150 mg total) by mouth daily.   No facility-administered encounter medications on file as of 03/19/2018.     Activities of Daily Living In your present state of health, do you have any difficulty performing the following activities: 03/19/2018  Hearing? N  Vision?  N  Comment wears contacts  Difficulty concentrating or making decisions? N  Walking or climbing stairs? N  Dressing or bathing? N  Doing errands, shopping? N  Preparing Food and eating ? N  Using the Toilet? N  In the past six months, have you accidently leaked urine? N  Do you have problems with loss of bowel control? N  Managing your Medications? N  Managing your Finances? N  Housekeeping or managing your Housekeeping? N  Some recent data might be hidden    Patient Care Team: [name], MD as PCP - General (Family Medicine) Justice Britain, MD as Consulting Physician (Orthopedic Surgery) Danella Sensing, MD as Consulting Physician (Dermatology) Richmond Campbell, MD as Consulting Physician (Gastroenterology) Fay Records, MD as Consulting Physician (Cardiology) Linda Hedges, DO as Consulting Physician (Obstetrics and Gynecology)    Assessment:   This is a routine wellness examination for [place]. Physical assessment deferred to PCP.  Exercise Activities and Dietary recommendations Current Exercise Habits: Structured exercise class, Type of exercise: strength training/weights;treadmill, Time (Minutes): 60, Frequency (Times/Week): 5, Weekly Exercise (Minutes/Week): 300, Intensity: Moderate, Exercise limited by: None identified   Diet (meal preparation, eat out, water intake, caffeinated beverages, dairy products, fruits and vegetables): in general, a "healthy" diet  , well balanced, on average, 3 meals per day      Goals    . Healthy Lifestyle     Continue to eat heart healthy diet (full of fruits, vegetables,  whole grains, lean protein, water--limit salt, fat, and sugar intake) and increase physical activity as tolerated. Continue doing brain stimulating activities (puzzles, reading, adult coloring books, staying active) to keep memory sharp.         Fall Risk Fall Risk  03/19/2018 12/24/2017 01/23/2017 01/21/2016 07/13/2015  Falls in the past year? 0 0 No No No  Comment - Emmi Telephone Survey: data to providers prior to load - - -   Depression Screen PHQ 2/9 Scores 03/19/2018 01/23/2017 01/21/2016 07/13/2015  PHQ - 2 Score 0 0 0 0     Cognitive Function Ad8 score reviewed for issues:  Issues making decisions:no   Less interest in hobbies / activities:no  Repeats questions, stories (family complaining):no  Trouble using ordinary gadgets (microwave, computer, phone):no  Forgets the month or year: no  Mismanaging finances: no  Remembering appts:no  Daily problems with thinking and/or memory:no Ad8 score is=0    MMSE - Mini Mental State Exam 01/21/2016  Orientation to time 5  Orientation to Place 5  Registration 3  Attention/ Calculation 5  Recall 3  Language- name 2 objects 2  Language- repeat 1  Language- follow 3 step command 3  Language- read & follow direction 1  Write a sentence 1  Copy design 1  Total score 30        Immunization History  Administered Date(s) Administered  . Influenza Whole 10/07/2009  . Influenza, High Dose Seasonal PF 10/31/2017  . Influenza,inj,Quad PF,6+ Mos 01/07/2015  . Influenza-Unspecified 11/07/2015  . Pneumococcal Conjugate-13 12/16/2013  . Tdap 06/21/2011   Screening Tests Health Maintenance  Topic Date Due  . PNA vac Low Risk Adult (2 of 2 - PPSV23) 12/17/2014  . MAMMOGRAM  03/21/2018  . TETANUS/TDAP  06/20/2021  . COLONOSCOPY  01/12/2026  . INFLUENZA VACCINE  Completed  . DEXA SCAN  Completed  . Hepatitis C Screening  Completed     Plan:    Please schedule your next medicare wellness visit with me in 1  yr.  Continue to eat healthy  Continue staying active  Bring a copy of your living will and/or healthcare power of attorney to your next office visit.  Keep up the great work!!      I have personally reviewed and noted the following in the patient's chart:   . Medical and social history . Use of alcohol, tobacco or illicit drugs  . Current medications and supplements . Functional ability and status . Nutritional status . Physical activity . Advanced directives . List of other physicians . Hospitalizations, surgeries, and ER visits in previous 12 months . Vitals . Screenings to include cognitive, depression, and falls . Referrals and appointments  In addition, I have reviewed and discussed with patient certain preventive protocols, quality metrics, and best practice recommendations. A written personalized care plan for preventive services as well as general preventive health recommendations were provided to patient.     Shela Nevin, [place]  03/19/2018   Medical screening examination/treatment was performed by qualified clinical staff member and as supervising physician I was immediately available for consultation/collaboration. I have reviewed documentation and agree with assessment and plan.  Penni Homans, MD

## 2018-03-19 ENCOUNTER — Ambulatory Visit (INDEPENDENT_AMBULATORY_CARE_PROVIDER_SITE_OTHER): Payer: Medicare Other | Admitting: Family Medicine

## 2018-03-19 ENCOUNTER — Encounter: Payer: Self-pay | Admitting: *Deleted

## 2018-03-19 ENCOUNTER — Ambulatory Visit (INDEPENDENT_AMBULATORY_CARE_PROVIDER_SITE_OTHER): Payer: Medicare Other | Admitting: *Deleted

## 2018-03-19 ENCOUNTER — Encounter: Payer: Self-pay | Admitting: Family Medicine

## 2018-03-19 VITALS — BP 116/70 | HR 58 | Temp 98.6°F | Resp 18 | Wt 132.0 lb

## 2018-03-19 VITALS — BP 116/70 | HR 58 | Ht 66.0 in | Wt 132.0 lb

## 2018-03-19 DIAGNOSIS — I10 Essential (primary) hypertension: Secondary | ICD-10-CM | POA: Diagnosis not present

## 2018-03-19 DIAGNOSIS — G4700 Insomnia, unspecified: Secondary | ICD-10-CM | POA: Diagnosis not present

## 2018-03-19 DIAGNOSIS — E785 Hyperlipidemia, unspecified: Secondary | ICD-10-CM | POA: Diagnosis not present

## 2018-03-19 DIAGNOSIS — R21 Rash and other nonspecific skin eruption: Secondary | ICD-10-CM

## 2018-03-19 DIAGNOSIS — Z23 Encounter for immunization: Secondary | ICD-10-CM | POA: Diagnosis not present

## 2018-03-19 DIAGNOSIS — Z0000 Encounter for general adult medical examination without abnormal findings: Secondary | ICD-10-CM | POA: Diagnosis not present

## 2018-03-19 DIAGNOSIS — K219 Gastro-esophageal reflux disease without esophagitis: Secondary | ICD-10-CM | POA: Diagnosis not present

## 2018-03-19 DIAGNOSIS — E538 Deficiency of other specified B group vitamins: Secondary | ICD-10-CM | POA: Diagnosis not present

## 2018-03-19 NOTE — Assessment & Plan Note (Signed)
Well controlled, no changes to meds. Encouraged heart healthy diet such as the DASH diet and exercise as tolerated.  °

## 2018-03-19 NOTE — Patient Instructions (Addendum)
Unisom Valerian Root  Warm milk with honey and vanilla and a carbohydrate  Pepcid/Famotidine 20 mg once to twice daily for hives and heartburn    Hypertension Hypertension, commonly called high blood pressure, is when the force of blood pumping through the arteries is too strong. The arteries are the blood vessels that carry blood from the heart throughout the body. Hypertension forces the heart to work harder to pump blood and may cause arteries to become narrow or stiff. Having untreated or uncontrolled hypertension can cause heart attacks, strokes, kidney disease, and other problems. A blood pressure reading consists of a higher number over a lower number. Ideally, your blood pressure should be below 120/80. The first ("top") number is called the systolic pressure. It is a measure of the pressure in your arteries as your heart beats. The second ("bottom") number is called the diastolic pressure. It is a measure of the pressure in your arteries as the heart relaxes. What are the causes? The cause of this condition is not known. What increases the risk? Some risk factors for high blood pressure are under your control. Others are not. Factors you can change  Smoking.  Having type 2 diabetes mellitus, high cholesterol, or both.  Not getting enough exercise or physical activity.  Being overweight.  Having too much fat, sugar, calories, or salt (sodium) in your diet.  Drinking too much alcohol. Factors that are difficult or impossible to change  Having chronic kidney disease.  Having a family history of high blood pressure.  Age. Risk increases with age.  Race. You may be at higher risk if you are African-American.  Gender. Men are at higher risk than women before age 71. After age 52, women are at higher risk than men.  Having obstructive sleep apnea.  Stress. What are the signs or symptoms? Extremely high blood pressure (hypertensive crisis) may  cause:  Headache.  Anxiety.  Shortness of breath.  Nosebleed.  Nausea and vomiting.  Severe chest pain.  Jerky movements you cannot control (seizures). How is this diagnosed? This condition is diagnosed by measuring your blood pressure while you are seated, with your arm resting on a surface. The cuff of the blood pressure monitor will be placed directly against the skin of your upper arm at the level of your heart. It should be measured at least twice using the same arm. Certain conditions can cause a difference in blood pressure between your right and left arms. Certain factors can cause blood pressure readings to be lower or higher than normal (elevated) for a short period of time:  When your blood pressure is higher when you are in a health care provider's office than when you are at home, this is called white coat hypertension. Most people with this condition do not need medicines.  When your blood pressure is higher at home than when you are in a health care provider's office, this is called masked hypertension. Most people with this condition may need medicines to control blood pressure. If you have a high blood pressure reading during one visit or you have normal blood pressure with other risk factors:  You may be asked to return on a different day to have your blood pressure checked again.  You may be asked to monitor your blood pressure at home for 1 week or longer. If you are diagnosed with hypertension, you may have other blood or imaging tests to help your health care provider understand your overall risk for other conditions. How  is this treated? This condition is treated by making healthy lifestyle changes, such as eating healthy foods, exercising more, and reducing your alcohol intake. Your health care provider may prescribe medicine if lifestyle changes are not enough to get your blood pressure under control, and if:  Your systolic blood pressure is above 130.  Your  diastolic blood pressure is above 80. Your personal target blood pressure may vary depending on your medical conditions, your age, and other factors. Follow these instructions at home: Eating and drinking   Eat a diet that is high in fiber and potassium, and low in sodium, added sugar, and fat. An example eating plan is called the DASH (Dietary Approaches to Stop Hypertension) diet. To eat this way: ? Eat plenty of fresh fruits and vegetables. Try to fill half of your plate at each meal with fruits and vegetables. ? Eat whole grains, such as whole wheat pasta, [place] rice, or whole grain bread. Fill about one quarter of your plate with whole grains. ? Eat or drink low-fat dairy products, such as skim milk or low-fat yogurt. ? Avoid fatty cuts of meat, processed or cured meats, and poultry with skin. Fill about one quarter of your plate with lean proteins, such as fish, chicken without skin, beans, eggs, and tofu. ? Avoid premade and processed foods. These tend to be higher in sodium, added sugar, and fat.  Reduce your daily sodium intake. Most people with hypertension should eat less than 1,500 mg of sodium a day.  Limit alcohol intake to no more than 1 drink a day for nonpregnant women and 2 drinks a day for men. One drink equals 12 oz of beer, 5 oz of wine, or 1 oz of hard liquor. Lifestyle   Work with your health care provider to maintain a healthy body weight or to lose weight. Ask what an ideal weight is for you.  Get at least 30 minutes of exercise that causes your heart to beat faster (aerobic exercise) most days of the week. Activities may include walking, swimming, or biking.  Include exercise to strengthen your muscles (resistance exercise), such as pilates or lifting weights, as part of your weekly exercise routine. Try to do these types of exercises for 30 minutes at least 3 days a week.  Do not use any products that contain nicotine or tobacco, such as cigarettes and  e-cigarettes. If you need help quitting, ask your health care provider.  Monitor your blood pressure at home as told by your health care provider.  Keep all follow-up visits as told by your health care provider. This is important. Medicines  Take over-the-counter and prescription medicines only as told by your health care provider. Follow directions carefully. Blood pressure medicines must be taken as prescribed.  Do not skip doses of blood pressure medicine. Doing this puts you at risk for problems and can make the medicine less effective.  Ask your health care provider about side effects or reactions to medicines that you should watch for. Contact a health care provider if:  You think you are having a reaction to a medicine you are taking.  You have headaches that keep coming back (recurring).  You feel dizzy.  You have swelling in your ankles.  You have trouble with your vision. Get help right away if:  You develop a severe headache or confusion.  You have unusual weakness or numbness.  You feel faint.  You have severe pain in your chest or abdomen.  You vomit repeatedly.  You have trouble breathing. Summary  Hypertension is when the force of blood pumping through your arteries is too strong. If this condition is not controlled, it may put you at risk for serious complications.  Your personal target blood pressure may vary depending on your medical conditions, your age, and other factors. For most people, a normal blood pressure is less than 120/80.  Hypertension is treated with lifestyle changes, medicines, or a combination of both. Lifestyle changes include weight loss, eating a healthy, low-sodium diet, exercising more, and limiting alcohol. This information is not intended to replace advice given to you by your health care provider. Make sure you discuss any questions you have with your health care provider. Document Released: 01/23/2005 Document Revised: 12/22/2015  Document Reviewed: 12/22/2015 Elsevier Interactive Patient Education  2019 [employer].

## 2018-03-19 NOTE — Patient Instructions (Signed)
Please schedule your next medicare wellness visit with me in 1 yr.  Continue to eat healthy  Continue staying active  Bring a copy of your living will and/or healthcare power of attorney to your next office visit.  Keep up the great work!!   Carrie [name] , Thank you for taking time to come for your Medicare Wellness Visit. I appreciate your ongoing commitment to your health goals. Please review the following plan we discussed and let me know if I can assist you in the future.   These are the goals we discussed: Goals    . Healthy Lifestyle     Continue to eat heart healthy diet (full of fruits, vegetables, whole grains, lean protein, water--limit salt, fat, and sugar intake) and increase physical activity as tolerated. Continue doing brain stimulating activities (puzzles, reading, adult coloring books, staying active) to keep memory sharp.         This is a list of the screening recommended for you and due dates:  Health Maintenance  Topic Date Due  . Mammogram  03/21/2018  . Tetanus Vaccine  06/20/2021  . Colon Cancer Screening  01/12/2026  . Flu Shot  Completed  . DEXA scan (bone density measurement)  Completed  .  Hepatitis C: One time screening is recommended by Center for Disease Control  (CDC) for  adults born from [birth year] through 1963.   Completed  . Pneumonia vaccines  Completed    Health Maintenance After Age 21 After age 23, you are at a higher risk for certain long-term diseases and infections as well as injuries from falls. Falls are a major cause of broken bones and head injuries in people who are older than age 49. Getting regular preventive care can help to keep you healthy and well. Preventive care includes getting regular testing and making lifestyle changes as recommended by your health care provider. Talk with your health care provider about:  Which screenings and tests you should have. A screening is a test that checks for a disease when you have no  symptoms.  A diet and exercise plan that is right for you. What should I know about screenings and tests to prevent falls? Screening and testing are the best ways to find a health problem early. Early diagnosis and treatment give you the best chance of managing medical conditions that are common after age 62. Certain conditions and lifestyle choices may make you more likely to have a fall. Your health care provider may recommend:  Regular vision checks. Poor vision and conditions such as cataracts can make you more likely to have a fall. If you wear glasses, make sure to get your prescription updated if your vision changes.  Medicine review. Work with your health care provider to regularly review all of the medicines you are taking, including over-the-counter medicines. Ask your health care provider about any side effects that may make you more likely to have a fall. Tell your health care provider if any medicines that you take make you feel dizzy or sleepy.  Osteoporosis screening. Osteoporosis is a condition that causes the bones to get weaker. This can make the bones weak and cause them to break more easily.  Blood pressure screening. Blood pressure changes and medicines to control blood pressure can make you feel dizzy.  Strength and balance checks. Your health care provider may recommend certain tests to check your strength and balance while standing, walking, or changing positions.  Foot health exam. Foot pain and numbness, as  well as not wearing proper footwear, can make you more likely to have a fall.  Depression screening. You may be more likely to have a fall if you have a fear of falling, feel emotionally low, or feel unable to do activities that you used to do.  Alcohol use screening. Using too much alcohol can affect your balance and may make you more likely to have a fall. What actions can I take to lower my risk of falls? General instructions  Talk with your health care  provider about your risks for falling. Tell your health care provider if: ? You fall. Be sure to tell your health care provider about all falls, even ones that seem minor. ? You feel dizzy, sleepy, or off-balance.  Take over-the-counter and prescription medicines only as told by your health care provider. These include any supplements.  Eat a healthy diet and maintain a healthy weight. A healthy diet includes low-fat dairy products, low-fat (lean) meats, and fiber from whole grains, beans, and lots of fruits and vegetables. Home safety  Remove any tripping hazards, such as rugs, cords, and clutter.  Install safety equipment such as grab bars in bathrooms and safety rails on stairs.  Keep rooms and walkways well-lit. Activity   Follow a regular exercise program to stay fit. This will help you maintain your balance. Ask your health care provider what types of exercise are appropriate for you.  If you need a cane or walker, use it as recommended by your health care provider.  Wear supportive shoes that have nonskid soles. Lifestyle  Do not drink alcohol if your health care provider tells you not to drink.  If you drink alcohol, limit how much you have: ? 0-1 drink a day for women. ? 0-2 drinks a day for men.  Be aware of how much alcohol is in your drink. In the [place] one drink equals one typical bottle of beer (12 oz), one-half glass of wine (5 oz), or one shot of hard liquor (1 oz).  Do not use any products that contain nicotine or tobacco, such as cigarettes and e-cigarettes. If you need help quitting, ask your health care provider. Summary  Having a healthy lifestyle and getting preventive care can help to protect your health and wellness after age 63.  Screening and testing are the best way to find a health problem early and help you avoid having a fall. Early diagnosis and treatment give you the best chance for managing medical conditions that are more common for people who  are older than age 14.  Falls are a major cause of broken bones and head injuries in people who are older than age 40. Take precautions to prevent a fall at home.  Work with your health care provider to learn what changes you can make to improve your health and wellness and to prevent falls. This information is not intended to replace advice given to you by your health care provider. Make sure you discuss any questions you have with your health care provider. Document Released: 12/06/2016 Document Revised: 12/06/2016 Document Reviewed: 12/06/2016 Elsevier Interactive Patient Education  2019 [employer].

## 2018-03-20 DIAGNOSIS — R21 Rash and other nonspecific skin eruption: Secondary | ICD-10-CM | POA: Insufficient documentation

## 2018-03-20 NOTE — Progress Notes (Signed)
Subjective:    Patient ID: Carrie [name], female    DOB: 10/21/1945, 73 y.o.   MRN: [id number]  No chief complaint on file.   HPI Patient is in today for follow up. She is now a grandmother and shares her pictures of her adorable granddaughter. She has no acute concerns and has no recent febrile illness or hospitalizations. Denies CP/palp/SOB/HA/congestion/fevers or GU c/o. Taking meds as prescribed. Notes some intermittent insomnia, rash and heartburn.   Past Medical History:  Diagnosis Date  . Breast cancer ([place]) 08/2004   Left breast invasive ductal carcinoma  . CHICKENPOX, HX OF 03/03/2010  . COMMON MIGRAINE 03/03/2010  . DEGENERATIVE DISC DISEASE 03/03/2010  . Essential hypertension, benign 03/03/2010  . Foot pain, bilateral 02/08/2016  . HYPOKALEMIA 03/03/2010  . Increased vitamin B12 level 01/10/2015  . Insomnia 08/01/2016  . Low back pain 08/01/2016  . MEASLES, HX OF 03/03/2010  . Melanoma of skin, site unspecified 03/03/2010  . 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 NEOPLASM, MALIGNANT, BREAST, HX OF 03/03/2010  . OSTEOPENIA 03/03/2010  . PALPITATIONS, HX OF 03/03/2010  . Pulmonary nodules 01/07/2015  . Rib fracture    left, 3 fractures s/p radiation: right side 2 fractured, no surgery    Past Surgical History:  Procedure Laterality Date  . BREAST LUMPECTOMY WITH NEEDLE LOCALIZATION AND AXILLARY SENTINEL LYMPH NODE BX Left 08/2004  . clavicle dislocate  73 yrs old   right, reapproximated w/pins, subsequently removed  . HERNIA REPAIR  05/2012  . left hip traumatic history     hematoma surgically evacuated  . SHOULDER ARTHROSCOPY     b/l shoulder with debridement  . TONSILLECTOMY      Family History  Problem Relation Age of Onset  . Hypertension Mother   . COPD Mother   . Alcohol abuse Father        history of  . Coronary artery disease Father   . Osteoarthritis Brother        knees  . Basal cell carcinoma Sister   . Heart disease Maternal Grandmother   . Stroke Maternal  Grandfather   . Osteoarthritis Sister        knees  . Migraines Sister     Social History   Socioeconomic History  . Marital status: Married    Spouse name: Not on file  . Number of children: Not on file  . Years of education: Not on file  . Highest education level: Not on file  Occupational History  . Not on file  Social Needs  . Financial resource strain: Not on file  . Food insecurity:    Worry: Not on file    Inability: Not on file  . Transportation needs:    Medical: Not on file    Non-medical: Not on file  Tobacco Use  . Smoking status: Former Smoker    Packs/day: 0.25    Last attempt to quit: 02/07/1968    Years since quitting: 50.1  . Smokeless tobacco: Never Used  Substance and Sexual Activity  . Alcohol use: Yes    Comment: 1-2 glasses of wine per day  . Drug use: No  . 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 Physical activity:    Days per week: Not on file    Minutes per session: Not on file  . Stress: Not on file  Relationships  . Social connections:  Talks on phone: Not on file    Gets together: Not on file    Attends religious service: Not on file    Active member of club or organization: Not on file    Attends meetings of clubs or organizations: Not on file    Relationship status: Not on file  . Intimate partner violence:    Fear of current or ex partner: Not on file    Emotionally abused: Not on file    Physically abused: Not on file    Forced sexual activity: Not on file  Other Topics Concern  . Not on file  Social History Narrative  . Not on file    Outpatient Medications Prior to Visit  Medication Sig Dispense Refill  . Ascorbic Acid (VITAMIN C) 1000 MG tablet Take 1,000 mg by mouth daily.    . Calcium-Vitamin D-Vitamin K (CALCIUM SOFT CHEWS PO) Take 1 tablet by mouth 2 (two) times daily.    . Cholecalciferol (VITAMIN  D3) 1000 UNITS CAPS Take 1 capsule by mouth daily.    Marland Kitchen EPINEPHrine 0.3 mg/0.3 mL IJ SOAJ injection INJECT 0.3 MLS (0.3 MG TOTAL) INTO THE MUSCLE ONCE FOR 1 DOSE.  1  . glucosamine-chondroitin 500-400 MG tablet Take 1 tablet by mouth daily.      . Lactobacillus (PROBIOTIC ACIDOPHILUS PO) Take by mouth.    . metoprolol succinate (TOPROL-XL) 25 MG 24 hr tablet TAKE 1 TABLET BY MOUTH EVERY DAY 90 tablet 1  . omeprazole (PRILOSEC) 20 MG capsule Take 20 mg by mouth daily.    . Vitamins-Lipotropics (B-50) TABS Take 1 tablet by mouth daily.      No facility-administered medications prior to visit.     Allergies  Allergen Reactions  . Hydrocodone-Acetaminophen Other (See Comments)    Slows Respiration Rate Down  . Codeine     REACTION: upsets stomach  . Demerol     hives  . Meperidine Hcl     REACTION: Hives    Review of Systems  Constitutional: Negative for fever and malaise/fatigue.  HENT: Negative for congestion.   Eyes: Negative for blurred vision.  Respiratory: Negative for shortness of breath.   Cardiovascular: Negative for chest pain, palpitations and leg swelling.  Gastrointestinal: Positive for heartburn. Negative for abdominal pain, blood in stool and nausea.  Genitourinary: Negative for dysuria and frequency.  Musculoskeletal: Negative for falls.  Skin: Positive for itching and rash.  Neurological: Negative for dizziness, loss of consciousness and headaches.  Endo/Heme/Allergies: Negative for environmental allergies.  Psychiatric/Behavioral: Negative for depression. The patient has insomnia. The patient is not nervous/anxious.        Objective:    Physical Exam Vitals signs and nursing note reviewed.  Constitutional:      General: She is not in acute distress.    Appearance: She is well-developed.  HENT:     Head: Normocephalic and atraumatic.     Nose: Nose normal.  Eyes:     General:        Right eye: No discharge.        Left eye: No discharge.  Neck:      Musculoskeletal: Normal range of motion and neck supple.  Cardiovascular:     Rate and Rhythm: Normal rate and regular rhythm.     Heart sounds: No murmur.  Pulmonary:     Effort: Pulmonary effort is normal.     Breath sounds: Normal breath sounds.  Abdominal:     General: Bowel sounds are normal.  Palpations: Abdomen is soft.     Tenderness: There is no abdominal tenderness.  Skin:    General: Skin is warm and dry.  Neurological:     Mental Status: She is alert and oriented to person, place, and time.     BP 116/70 (BP Location: Left Arm, Patient Position: Sitting, Cuff Size: Normal)   Pulse (!) 58   Temp 98.6 F (37 C) (Oral)   Resp 18   Wt 132 lb (59.9 kg)   SpO2 96%   BMI 21.31 kg/m  Wt Readings from Last 3 Encounters:  03/19/18 132 lb (59.9 kg)  03/19/18 132 lb (59.9 kg)  01/24/18 126 lb 4 oz (57.3 kg)     Lab Results  Component Value Date   WBC 5.5 09/24/2017   HGB 14.2 09/24/2017   HCT 41.8 09/24/2017   PLT 217.0 09/24/2017   GLUCOSE 88 09/24/2017   CHOL 214 (H) 09/24/2017   TRIG 165.0 (H) 09/24/2017   HDL 79.10 09/24/2017   LDLDIRECT 94.8 07/28/2010   LDLCALC 101 (H) 09/24/2017   ALT 17 09/24/2017   AST 21 09/24/2017   NA 141 09/24/2017   K 4.3 09/24/2017   CL 102 09/24/2017   CREATININE 0.67 09/24/2017   BUN 20 09/24/2017   CO2 31 09/24/2017   TSH 0.66 09/24/2017   HGBA1C 5.5 09/25/2017    Lab Results  Component Value Date   TSH 0.66 09/24/2017   Lab Results  Component Value Date   WBC 5.5 09/24/2017   HGB 14.2 09/24/2017   HCT 41.8 09/24/2017   MCV 101.1 (H) 09/24/2017   PLT 217.0 09/24/2017   Lab Results  Component Value Date   NA 141 09/24/2017   K 4.3 09/24/2017   CO2 31 09/24/2017   GLUCOSE 88 09/24/2017   BUN 20 09/24/2017   CREATININE 0.67 09/24/2017   BILITOT 0.6 09/24/2017   ALKPHOS 121 (H) 09/24/2017   AST 21 09/24/2017   ALT 17 09/24/2017   PROT 6.4 09/24/2017   ALBUMIN 4.2 09/24/2017   CALCIUM 9.8 09/24/2017     GFR 92.47 09/24/2017   Lab Results  Component Value Date   CHOL 214 (H) 09/24/2017   Lab Results  Component Value Date   HDL 79.10 09/24/2017   Lab Results  Component Value Date   LDLCALC 101 (H) 09/24/2017   Lab Results  Component Value Date   TRIG 165.0 (H) 09/24/2017   Lab Results  Component Value Date   CHOLHDL 3 09/24/2017   Lab Results  Component Value Date   HGBA1C 5.5 09/25/2017       Assessment & Plan:   Problem List Items Addressed This Visit    ESSENTIAL HYPERTENSION, BENIGN    Well controlled, no changes to meds. Encouraged heart healthy diet such as the DASH diet and exercise as tolerated.       Relevant Orders   CBC   Comprehensive metabolic panel   TSH   GERD (gastroesophageal reflux disease)    Avoid offending foods, start probiotics. Do not eat large meals in late evening and consider raising head of bed. Pepcid bid prn      Insomnia    Encouraged good sleep hygiene such as dark, quiet room. No blue/green glowing lights such as computer screens in bedroom. No alcohol or stimulants in evening. Cut down on caffeine as able. Regular exercise is helpful but not just prior to bed time. Unisom prn. Valerian root prn. Warm milk and cookies.  Disorder of vitamin B12    Supplement and monitor      Rash    Intermittent and pruritic. Cetirizine bid and Famotidine bid when flares       Other Visit Diagnoses    Hyperlipidemia, unspecified hyperlipidemia type    -  Primary   Relevant Orders   Lipid panel      I am having Melina A. Owens Shark "Chris" maintain her B-50, glucosamine-chondroitin, Calcium-Vitamin D-Vitamin K (CALCIUM SOFT CHEWS PO), Vitamin D3, vitamin C, EPINEPHrine, Lactobacillus (PROBIOTIC ACIDOPHILUS PO), metoprolol succinate, and omeprazole.  No orders of the defined types were placed in this encounter.    Penni Homans, MD

## 2018-03-20 NOTE — Assessment & Plan Note (Signed)
Encouraged good sleep hygiene such as dark, quiet room. No blue/green glowing lights such as computer screens in bedroom. No alcohol or stimulants in evening. Cut down on caffeine as able. Regular exercise is helpful but not just prior to bed time. Unisom prn. Valerian root prn. Warm milk and cookies.

## 2018-03-20 NOTE — Assessment & Plan Note (Signed)
Intermittent and pruritic. Cetirizine bid and Famotidine bid when flares

## 2018-03-20 NOTE — Assessment & Plan Note (Signed)
Supplement and monitor 

## 2018-03-20 NOTE — Assessment & Plan Note (Signed)
Avoid offending foods, start probiotics. Do not eat large meals in late evening and consider raising head of bed. Pepcid bid prn

## 2018-03-27 DIAGNOSIS — Z1231 Encounter for screening mammogram for malignant neoplasm of breast: Secondary | ICD-10-CM | POA: Diagnosis not present

## 2018-03-27 DIAGNOSIS — Z6821 Body mass index (BMI) 21.0-21.9, adult: Secondary | ICD-10-CM | POA: Diagnosis not present

## 2018-03-27 DIAGNOSIS — Z124 Encounter for screening for malignant neoplasm of cervix: Secondary | ICD-10-CM | POA: Diagnosis not present

## 2018-04-07 ENCOUNTER — Other Ambulatory Visit: Payer: Self-pay | Admitting: Family Medicine

## 2018-04-17 ENCOUNTER — Other Ambulatory Visit: Payer: Self-pay | Admitting: Family Medicine

## 2018-04-30 DIAGNOSIS — Z85828 Personal history of other malignant neoplasm of skin: Secondary | ICD-10-CM | POA: Diagnosis not present

## 2018-04-30 DIAGNOSIS — L720 Epidermal cyst: Secondary | ICD-10-CM | POA: Diagnosis not present

## 2018-04-30 DIAGNOSIS — L565 Disseminated superficial actinic porokeratosis (DSAP): Secondary | ICD-10-CM | POA: Diagnosis not present

## 2018-04-30 DIAGNOSIS — L821 Other seborrheic keratosis: Secondary | ICD-10-CM | POA: Diagnosis not present

## 2018-04-30 DIAGNOSIS — D1801 Hemangioma of skin and subcutaneous tissue: Secondary | ICD-10-CM | POA: Diagnosis not present

## 2018-04-30 DIAGNOSIS — D2221 Melanocytic nevi of right ear and external auricular canal: Secondary | ICD-10-CM | POA: Diagnosis not present

## 2018-04-30 DIAGNOSIS — D225 Melanocytic nevi of trunk: Secondary | ICD-10-CM | POA: Diagnosis not present

## 2018-04-30 DIAGNOSIS — Z85820 Personal history of malignant melanoma of skin: Secondary | ICD-10-CM | POA: Diagnosis not present

## 2018-05-27 DIAGNOSIS — D692 Other nonthrombocytopenic purpura: Secondary | ICD-10-CM | POA: Diagnosis not present

## 2018-05-27 DIAGNOSIS — Z85828 Personal history of other malignant neoplasm of skin: Secondary | ICD-10-CM | POA: Diagnosis not present

## 2018-07-11 DIAGNOSIS — D485 Neoplasm of uncertain behavior of skin: Secondary | ICD-10-CM | POA: Diagnosis not present

## 2018-07-11 DIAGNOSIS — L570 Actinic keratosis: Secondary | ICD-10-CM | POA: Diagnosis not present

## 2018-07-11 DIAGNOSIS — D0472 Carcinoma in situ of skin of left lower limb, including hip: Secondary | ICD-10-CM | POA: Diagnosis not present

## 2018-07-11 DIAGNOSIS — Z85828 Personal history of other malignant neoplasm of skin: Secondary | ICD-10-CM | POA: Diagnosis not present

## 2018-07-18 DIAGNOSIS — D1801 Hemangioma of skin and subcutaneous tissue: Secondary | ICD-10-CM | POA: Diagnosis not present

## 2018-09-29 ENCOUNTER — Other Ambulatory Visit: Payer: Self-pay | Admitting: Family Medicine

## 2018-10-04 ENCOUNTER — Other Ambulatory Visit: Payer: Self-pay

## 2018-10-09 DIAGNOSIS — L570 Actinic keratosis: Secondary | ICD-10-CM | POA: Diagnosis not present

## 2018-10-09 DIAGNOSIS — D485 Neoplasm of uncertain behavior of skin: Secondary | ICD-10-CM | POA: Diagnosis not present

## 2018-10-09 DIAGNOSIS — Z85828 Personal history of other malignant neoplasm of skin: Secondary | ICD-10-CM | POA: Diagnosis not present

## 2018-10-10 ENCOUNTER — Other Ambulatory Visit: Payer: Medicare Other

## 2018-10-10 ENCOUNTER — Ambulatory Visit (INDEPENDENT_AMBULATORY_CARE_PROVIDER_SITE_OTHER): Payer: Medicare Other | Admitting: *Deleted

## 2018-10-10 ENCOUNTER — Other Ambulatory Visit: Payer: Self-pay

## 2018-10-10 ENCOUNTER — Other Ambulatory Visit (INDEPENDENT_AMBULATORY_CARE_PROVIDER_SITE_OTHER): Payer: Medicare Other

## 2018-10-10 DIAGNOSIS — I10 Essential (primary) hypertension: Secondary | ICD-10-CM

## 2018-10-10 DIAGNOSIS — Z23 Encounter for immunization: Secondary | ICD-10-CM

## 2018-10-10 DIAGNOSIS — E785 Hyperlipidemia, unspecified: Secondary | ICD-10-CM | POA: Diagnosis not present

## 2018-10-10 LAB — LIPID PANEL
Cholesterol: 207 mg/dL — ABNORMAL HIGH (ref 0–200)
HDL: 94.6 mg/dL (ref >39.00)
LDL Cholesterol: 97 mg/dL (ref 0–99)
NonHDL: 112.17
Total CHOL/HDL Ratio: 2
Triglycerides: 78 mg/dL (ref 0.0–149.0)
VLDL: 15.6 mg/dL (ref 0.0–40.0)

## 2018-10-10 LAB — COMPREHENSIVE METABOLIC PANEL
ALT: 15 U/L (ref 0–35)
AST: 18 U/L (ref 0–37)
Albumin: 4.3 g/dL (ref 3.5–5.2)
Alkaline Phosphatase: 152 U/L — ABNORMAL HIGH (ref 39–117)
BUN: 18 mg/dL (ref 6–23)
CO2: 30 mEq/L (ref 19–32)
Calcium: 9.3 mg/dL (ref 8.4–10.5)
Chloride: 103 mEq/L (ref 96–112)
Creatinine, Ser: 0.64 mg/dL (ref 0.40–1.20)
GFR: 91.45 mL/min (ref >60.00)
Glucose, Bld: 88 mg/dL (ref 70–99)
Potassium: 4.3 mEq/L (ref 3.5–5.1)
Sodium: 140 mEq/L (ref 135–145)
Total Bilirubin: 0.7 mg/dL (ref 0.2–1.2)
Total Protein: 6.5 g/dL (ref 6.0–8.3)

## 2018-10-10 LAB — CBC
HCT: 41.8 % (ref 36.0–46.0)
Hemoglobin: 14.4 g/dL (ref 12.0–15.0)
MCHC: 34.4 g/dL (ref 30.0–36.0)
MCV: 102.5 fl — ABNORMAL HIGH (ref 78.0–100.0)
Platelets: 174 10*3/uL (ref 150.0–400.0)
RBC: 4.08 Mil/uL (ref 3.87–5.11)
RDW: 13 % (ref 11.5–15.5)
WBC: 4.5 10*3/uL (ref 4.0–10.5)

## 2018-10-10 LAB — TSH: TSH: 1.09 u[IU]/mL (ref 0.35–4.50)

## 2018-10-10 NOTE — Progress Notes (Signed)
Patient here for flu vaccine  Vaccine given and patient tolerated well.

## 2018-10-11 ENCOUNTER — Other Ambulatory Visit (INDEPENDENT_AMBULATORY_CARE_PROVIDER_SITE_OTHER): Payer: Medicare Other

## 2018-10-11 ENCOUNTER — Other Ambulatory Visit: Payer: Medicare Other

## 2018-10-11 DIAGNOSIS — R748 Abnormal levels of other serum enzymes: Secondary | ICD-10-CM

## 2018-10-11 LAB — GAMMA GT: GGT: 12 U/L (ref 7–51)

## 2018-10-15 ENCOUNTER — Ambulatory Visit (INDEPENDENT_AMBULATORY_CARE_PROVIDER_SITE_OTHER): Payer: Medicare Other | Admitting: Family Medicine

## 2018-10-15 ENCOUNTER — Other Ambulatory Visit: Payer: Self-pay

## 2018-10-15 DIAGNOSIS — E538 Deficiency of other specified B group vitamins: Secondary | ICD-10-CM

## 2018-10-15 DIAGNOSIS — G43009 Migraine without aura, not intractable, without status migrainosus: Secondary | ICD-10-CM

## 2018-10-15 DIAGNOSIS — R748 Abnormal levels of other serum enzymes: Secondary | ICD-10-CM

## 2018-10-15 DIAGNOSIS — C439 Malignant melanoma of skin, unspecified: Secondary | ICD-10-CM

## 2018-10-15 DIAGNOSIS — I10 Essential (primary) hypertension: Secondary | ICD-10-CM

## 2018-10-15 DIAGNOSIS — E785 Hyperlipidemia, unspecified: Secondary | ICD-10-CM | POA: Insufficient documentation

## 2018-10-15 NOTE — Assessment & Plan Note (Signed)
Has had less trouble with them since giving up wine. Encouraged increased hydration, 64 ounces of clear fluids daily. Minimize alcohol and caffeine. Eat small frequent meals with lean proteins and complex carbs. Avoid high and low blood sugars. Get adequate sleep, 7-8 hours a night. Needs exercise daily preferably in the morning.

## 2018-10-15 NOTE — Assessment & Plan Note (Signed)
Well controlled, no changes to meds. Encouraged heart healthy diet such as the DASH diet and exercise as tolerated.  °

## 2018-10-15 NOTE — Assessment & Plan Note (Signed)
Encouraged heart healthy diet, increase exercise, avoid trans fats, consider a krill oil cap daily 

## 2018-10-15 NOTE — Assessment & Plan Note (Signed)
Supplement and monitor 

## 2018-10-15 NOTE — Progress Notes (Signed)
Subjective:    Patient ID: Carrie [name], female    DOB: 1945/07/18, 73 y.o.   MRN: [id number]  No chief complaint on file.   HPI Patient is in today for follow up on chronic medical concerns including hypertension, hyperlipidemia and more. Her good news is her daughter now has a 52 month old daughter whom she reports is doing well. Patient herself feels well and denies any recent febrile illness or hospitalizations. Is trying to maintain quarantine well but does see her daughter and a few close friends outside and they are also quarantining well. She admits to increased po and alcohol intake the week prior to lab work this time. Denies CP/palp/SOB/HA/congestion/fevers/GI or GU c/o. 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   NEOPLASM, MALIGNANT, BREAST, HX OF 03/03/2010   OSTEOPENIA 03/03/2010   PALPITATIONS, HX OF 03/03/2010   Pulmonary nodules 01/07/2015   Rib fracture    left, 3 fractures s/p radiation: right side 2 fractured, no surgery    Past Surgical History:  Procedure Laterality Date   BREAST LUMPECTOMY WITH NEEDLE LOCALIZATION AND AXILLARY SENTINEL LYMPH NODE BX Left 08/2004   clavicle dislocate  73 yrs old   right, reapproximated w/pins, subsequently removed   HERNIA REPAIR  05/2012   left hip traumatic history     hematoma surgically evacuated   SHOULDER ARTHROSCOPY     b/l shoulder with debridement   TONSILLECTOMY      Family History  Problem Relation Age of  Onset   Hypertension Mother    COPD Mother    Alcohol abuse Father        history of   Coronary artery disease Father    Osteoarthritis Brother        knees   Basal cell carcinoma Sister    Heart disease Maternal Grandmother    Stroke Maternal Grandfather    Osteoarthritis Sister        knees   Migraines Sister     Social History   Socioeconomic History   Marital status: Married    Spouse name: Not on file   Number of children: Not on file   Years of education: Not on file   Highest education level: Not on file  Occupational History   Not on file  Social Needs   Financial resource strain: Not on file   Food insecurity    Worry: Not on file    Inability: Not on file   Transportation needs    Medical: Not on file    Non-medical: Not on file  Tobacco Use   Smoking status: Former Smoker    Packs/day: 0.25    Quit date: 02/07/1968    Years since quitting: 50.7   Smokeless tobacco: Never Used  Substance and Sexual Activity   Alcohol use: Yes    Comment: 1-2 glasses of wine per day   Drug use: No   Sexual activity: Not Currently    Birth control/protection: Post-menopausal    Comment: just  retired from [employer], heart healthy diet  Lifestyle   Physical activity    Days per week: Not on file    Minutes per session: Not on file   Stress: Not on file  Relationships   Social connections    Talks on phone: Not on file    Gets together: Not on file    Attends religious service: Not on file    Active member of club or organization: Not on file    Attends meetings of clubs or organizations: Not on file    Relationship status: Not on file   Intimate partner violence    Fear of current or ex partner: Not on file    Emotionally abused: Not on file    Physically abused: Not on file    Forced sexual activity: Not on file  Other Topics Concern   Not on file  Social History Narrative   Not on file    Outpatient Medications Prior to  Visit  Medication Sig Dispense Refill   Ascorbic Acid (VITAMIN C) 1000 MG tablet Take 1,000 mg by mouth daily.     Calcium-Vitamin D-Vitamin K (CALCIUM SOFT CHEWS PO) Take 1 tablet by mouth 2 (two) times daily.     Cholecalciferol (VITAMIN D3) 1000 UNITS CAPS Take 1 capsule by mouth daily.     EPINEPHrine 0.3 mg/0.3 mL IJ SOAJ injection INJECT 0.3 MLS (0.3 MG TOTAL) INTO THE MUSCLE ONCE FOR 1 DOSE.  1   glucosamine-chondroitin 500-400 MG tablet Take 1 tablet by mouth daily.       Lactobacillus (PROBIOTIC ACIDOPHILUS PO) Take by mouth.     metoprolol succinate (TOPROL-XL) 25 MG 24 hr tablet TAKE 1 TABLET BY MOUTH EVERY DAY 90 tablet 1   omeprazole (PRILOSEC) 20 MG capsule Take 20 mg by mouth daily.     Vitamins-Lipotropics (B-50) TABS Take 1 tablet by mouth daily.      No facility-administered medications prior to visit.     Allergies  Allergen Reactions   Hydrocodone-Acetaminophen Other (See Comments)    Slows Respiration Rate Down   Codeine     REACTION: upsets stomach   Demerol     hives   Meperidine Hcl     REACTION: Hives    Review of Systems  Constitutional: Negative for fever and malaise/fatigue.  HENT: Negative for congestion.   Eyes: Negative for blurred vision.  Respiratory: Negative for shortness of breath.   Cardiovascular: Negative for chest pain, palpitations and leg swelling.  Gastrointestinal: Negative for abdominal pain, blood in stool and nausea.  Genitourinary: Negative for dysuria and frequency.  Musculoskeletal: Negative for falls.  Skin: Negative for rash.  Neurological: Negative for dizziness, loss of consciousness and headaches.  Endo/Heme/Allergies: Negative for environmental allergies.  Psychiatric/Behavioral: Negative for depression. The patient is not nervous/anxious.        Objective:    Physical Exam Vitals signs and nursing note reviewed.  Constitutional:      General: She is not in acute distress.    Appearance: She is  well-developed.  HENT:     Head: Normocephalic and atraumatic.     Nose: Nose normal.  Eyes:     General:        Right eye: No discharge.        Left eye: No discharge.  Neck:     Musculoskeletal: Normal range of motion and neck supple.  Cardiovascular:     Rate and Rhythm: Normal rate and regular rhythm.  Heart sounds: No murmur.  Pulmonary:     Effort: Pulmonary effort is normal.     Breath sounds: Normal breath sounds.  Abdominal:     General: Bowel sounds are normal.     Palpations: Abdomen is soft.     Tenderness: There is no abdominal tenderness.  Skin:    General: Skin is warm and dry.  Neurological:     Mental Status: She is alert and oriented to person, place, and time.     There were no vitals taken for this visit. Wt Readings from Last 3 Encounters:  03/19/18 132 lb (59.9 kg)  03/19/18 132 lb (59.9 kg)  01/24/18 126 lb 4 oz (57.3 kg)    Diabetic Foot Exam - Simple   No data filed     Lab Results  Component Value Date   WBC 4.5 10/10/2018   HGB 14.4 10/10/2018   HCT 41.8 10/10/2018   PLT 174.0 10/10/2018   GLUCOSE 88 10/10/2018   CHOL 207 (H) 10/10/2018   TRIG 78.0 10/10/2018   HDL 94.60 10/10/2018   LDLDIRECT 94.8 07/28/2010   LDLCALC 97 10/10/2018   ALT 15 10/10/2018   AST 18 10/10/2018   NA 140 10/10/2018   K 4.3 10/10/2018   CL 103 10/10/2018   CREATININE 0.64 10/10/2018   BUN 18 10/10/2018   CO2 30 10/10/2018   TSH 1.09 10/10/2018   HGBA1C 5.5 09/25/2017    Lab Results  Component Value Date   TSH 1.09 10/10/2018   Lab Results  Component Value Date   WBC 4.5 10/10/2018   HGB 14.4 10/10/2018   HCT 41.8 10/10/2018   MCV 102.5 (H) 10/10/2018   PLT 174.0 10/10/2018   Lab Results  Component Value Date   NA 140 10/10/2018   K 4.3 10/10/2018   CO2 30 10/10/2018   GLUCOSE 88 10/10/2018   BUN 18 10/10/2018   CREATININE 0.64 10/10/2018   BILITOT 0.7 10/10/2018   ALKPHOS 152 (H) 10/10/2018   AST 18 10/10/2018   ALT 15  10/10/2018   PROT 6.5 10/10/2018   ALBUMIN 4.3 10/10/2018   CALCIUM 9.3 10/10/2018   GFR 91.45 10/10/2018   Lab Results  Component Value Date   CHOL 207 (H) 10/10/2018   Lab Results  Component Value Date   HDL 94.60 10/10/2018   Lab Results  Component Value Date   LDLCALC 97 10/10/2018   Lab Results  Component Value Date   TRIG 78.0 10/10/2018   Lab Results  Component Value Date   CHOLHDL 2 10/10/2018   Lab Results  Component Value Date   HGBA1C 5.5 09/25/2017       Assessment & Plan:   Problem List Items Addressed This Visit    Melanoma of skin ([place])    Continues to follow closely with dermatology      Migraine without aura    Has had less trouble with them since giving up wine. Encouraged increased hydration, 64 ounces of clear fluids daily. Minimize alcohol and caffeine. Eat small frequent meals with lean proteins and complex carbs. Avoid high and low blood sugars. Get adequate sleep, 7-8 hours a night. Needs exercise daily preferably in the morning.      ESSENTIAL HYPERTENSION, BENIGN    Well controlled, no changes to meds. Encouraged heart healthy diet such as the DASH diet and exercise as tolerated.       Elevated alkaline phosphatase level    Mild and asymptomatic, had had increased alcohol and rich  food consumption week prior testing. Will check fasting at next blood draw and monitor      Disorder of vitamin B12    Supplement and monitor      Hyperlipidemia    Encouraged heart healthy diet, increase exercise, avoid trans fats, consider a krill oil cap daily         I am having Marjan A. Owens Shark "Chris" maintain her B-50, glucosamine-chondroitin, Calcium-Vitamin D-Vitamin K (CALCIUM SOFT CHEWS PO), Vitamin D3, vitamin C, EPINEPHrine, Lactobacillus (PROBIOTIC ACIDOPHILUS PO), omeprazole, and metoprolol succinate.  No orders of the defined types were placed in this encounter.    Penni Homans, MD

## 2018-10-15 NOTE — Assessment & Plan Note (Signed)
Continues to follow closely with dermatology

## 2018-10-15 NOTE — Assessment & Plan Note (Signed)
Mild and asymptomatic, had had increased alcohol and rich food consumption week prior testing. Will check fasting at next blood draw and monitor

## 2018-10-15 NOTE — Patient Instructions (Signed)

## 2018-10-18 ENCOUNTER — Encounter: Payer: Medicare Other | Admitting: Family Medicine

## 2018-12-04 DIAGNOSIS — K219 Gastro-esophageal reflux disease without esophagitis: Secondary | ICD-10-CM | POA: Diagnosis not present

## 2018-12-10 DIAGNOSIS — Z85820 Personal history of malignant melanoma of skin: Secondary | ICD-10-CM | POA: Diagnosis not present

## 2018-12-10 DIAGNOSIS — Z85828 Personal history of other malignant neoplasm of skin: Secondary | ICD-10-CM | POA: Diagnosis not present

## 2018-12-10 DIAGNOSIS — D225 Melanocytic nevi of trunk: Secondary | ICD-10-CM | POA: Diagnosis not present

## 2018-12-10 DIAGNOSIS — L570 Actinic keratosis: Secondary | ICD-10-CM | POA: Diagnosis not present

## 2018-12-10 DIAGNOSIS — D1801 Hemangioma of skin and subcutaneous tissue: Secondary | ICD-10-CM | POA: Diagnosis not present

## 2018-12-10 DIAGNOSIS — L565 Disseminated superficial actinic porokeratosis (DSAP): Secondary | ICD-10-CM | POA: Diagnosis not present

## 2018-12-10 DIAGNOSIS — L821 Other seborrheic keratosis: Secondary | ICD-10-CM | POA: Diagnosis not present

## 2018-12-20 DIAGNOSIS — N390 Urinary tract infection, site not specified: Secondary | ICD-10-CM | POA: Diagnosis not present

## 2018-12-22 ENCOUNTER — Encounter: Payer: Self-pay | Admitting: Family Medicine

## 2018-12-22 DIAGNOSIS — R300 Dysuria: Secondary | ICD-10-CM

## 2018-12-26 ENCOUNTER — Telehealth: Payer: Self-pay

## 2018-12-26 NOTE — Telephone Encounter (Signed)
Copied from [place] [id number]. Topic: General - Other >> Dec 26, 2018 10:13 AM Keene Breath wrote: Reason for CRM: Patient called to ask Janai Maudlin to call her.  Patient has some questions for her and would like a call back.  CB# [phone]    Sent patient a mychart message

## 2018-12-27 ENCOUNTER — Other Ambulatory Visit (INDEPENDENT_AMBULATORY_CARE_PROVIDER_SITE_OTHER): Payer: Medicare Other

## 2018-12-27 DIAGNOSIS — R300 Dysuria: Secondary | ICD-10-CM

## 2018-12-27 LAB — URINALYSIS, ROUTINE W REFLEX MICROSCOPIC
RBC / HPF: NONE SEEN (ref 0–?)
WBC, UA: NONE SEEN (ref 0–?)

## 2018-12-27 LAB — CBC WITH DIFFERENTIAL/PLATELET
Basophils Absolute: 0.1 10*3/uL (ref 0.0–0.1)
Basophils Relative: 1.1 % (ref 0.0–3.0)
Eosinophils Absolute: 0.1 10*3/uL (ref 0.0–0.7)
Eosinophils Relative: 1.1 % (ref 0.0–5.0)
HCT: 40.8 % (ref 36.0–46.0)
Hemoglobin: 13.8 g/dL (ref 12.0–15.0)
Lymphocytes Relative: 34.4 % (ref 12.0–46.0)
Lymphs Abs: 1.7 10*3/uL (ref 0.7–4.0)
MCHC: 33.8 g/dL (ref 30.0–36.0)
MCV: 102.5 fl — ABNORMAL HIGH (ref 78.0–100.0)
Monocytes Absolute: 0.4 10*3/uL (ref 0.1–1.0)
Monocytes Relative: 7.9 % (ref 3.0–12.0)
Neutro Abs: 2.7 10*3/uL (ref 1.4–7.7)
Neutrophils Relative %: 55.5 % (ref 43.0–77.0)
Platelets: 208 10*3/uL (ref 150.0–400.0)
RBC: 3.99 Mil/uL (ref 3.87–5.11)
RDW: 12.5 % (ref 11.5–15.5)
WBC: 4.8 10*3/uL (ref 4.0–10.5)

## 2018-12-27 LAB — URINALYSIS

## 2018-12-27 MED ORDER — SULFAMETHOXAZOLE-TRIMETHOPRIM 800-160 MG PO TABS: 1.0000 | ORAL_TABLET | Freq: Two times a day (BID) | ORAL | 0 refills | Status: AC

## 2018-12-27 NOTE — Addendum Note (Signed)
Addended by: Isaiah Serge D on: 12/27/2018 11:37 AM   Modules accepted: Orders

## 2018-12-28 LAB — URINE CULTURE
MICRO NUMBER:: 1123874
Result:: NO GROWTH
SPECIMEN QUALITY:: ADEQUATE

## 2018-12-30 ENCOUNTER — Encounter: Payer: Self-pay | Admitting: Family Medicine

## 2018-12-30 NOTE — Telephone Encounter (Signed)
Noted  

## 2018-12-30 NOTE — Addendum Note (Signed)
Addended by: Magdalene Molly A on: 12/30/2018 11:46 AM   Modules accepted: Orders

## 2018-12-31 ENCOUNTER — Encounter: Payer: Self-pay | Admitting: Family Medicine

## 2018-12-31 ENCOUNTER — Telehealth: Payer: Self-pay

## 2018-12-31 ENCOUNTER — Other Ambulatory Visit: Payer: Self-pay

## 2018-12-31 DIAGNOSIS — R300 Dysuria: Secondary | ICD-10-CM

## 2018-12-31 NOTE — Telephone Encounter (Signed)
Copied from [place] [id number]. Topic: General - Other >> Dec 31, 2018 11:17 AM Antonieta Iba C wrote: Reason for CRM: pt is calling in to speak with Princess, pt says that she has a few concerns. Offered to assist pt, pt declined stating that only princess can help her.    CB: [phone]

## 2018-12-31 NOTE — Telephone Encounter (Signed)
See mychart.  

## 2019-01-14 DIAGNOSIS — N3582 Other urethral stricture, female: Secondary | ICD-10-CM | POA: Diagnosis not present

## 2019-01-14 DIAGNOSIS — R3915 Urgency of urination: Secondary | ICD-10-CM | POA: Diagnosis not present

## 2019-01-21 DIAGNOSIS — Z85828 Personal history of other malignant neoplasm of skin: Secondary | ICD-10-CM | POA: Diagnosis not present

## 2019-01-21 DIAGNOSIS — L438 Other lichen planus: Secondary | ICD-10-CM | POA: Diagnosis not present

## 2019-02-17 ENCOUNTER — Encounter: Payer: Self-pay | Admitting: Family Medicine

## 2019-03-18 DIAGNOSIS — L570 Actinic keratosis: Secondary | ICD-10-CM | POA: Diagnosis not present

## 2019-03-18 DIAGNOSIS — Z85828 Personal history of other malignant neoplasm of skin: Secondary | ICD-10-CM | POA: Diagnosis not present

## 2019-03-21 ENCOUNTER — Ambulatory Visit: Payer: Medicare Other | Admitting: *Deleted

## 2019-03-23 ENCOUNTER — Other Ambulatory Visit: Payer: Self-pay | Admitting: Family Medicine

## 2019-03-28 NOTE — Progress Notes (Signed)
Virtual Visit via Audio Note  I connected with patient on 03/31/19 at  1:45 PM EST by audio enabled telemedicine application and verified that I am speaking with the correct person using two identifiers.   THIS ENCOUNTER IS A VIRTUAL VISIT DUE TO COVID-19 - PATIENT WAS NOT SEEN IN THE OFFICE. PATIENT HAS CONSENTED TO VIRTUAL VISIT / TELEMEDICINE VISIT   Location of patient: home  Location of provider: office  I discussed the limitations of evaluation and management by telemedicine and the availability of in person appointments. The patient expressed understanding and agreed to proceed.   Subjective:   Carrie [name] is a 74 y.o. female who presents for Medicare Annual (Subsequent) preventive examination.  Review of Systems:    Home Safety/Smoke Alarms: Feels safe in home. Smoke alarms in place.  Lives w/ husband in 2 story home.   Female:        Mammo- pt states she has appt in March w/ Dr.Megan Morris Dexa scan-   pt states she has appt in March          CCS- 01/13/16. Recall 10 yrs.     Objective:     Vitals: Unable to assess. This visit is enabled though telemedicine due to Covid 19.   Advanced Directives 03/31/2019 03/19/2018 01/23/2017 01/21/2016  Does Patient Have a Medical Advance Directive? Yes Yes Yes;No Yes  Type of Paramedic of [place];Living will [place];Living will [place];Living will [place];Living will  Does patient want to make changes to medical advance directive? No - Patient declined No - Patient declined - -  Copy of [place] in Chart? No - copy requested No - copy requested No - copy requested No - copy requested    Tobacco Social History   Tobacco Use  Smoking Status Former Smoker  . Packs/day: 0.25  . Quit date: 02/07/1968  . Years since quitting: 51.1  Smokeless Tobacco Never Used     Counseling given: Not Answered   Clinical  Intake: Pain : No/denies pain     Past Medical History:  Diagnosis Date  . Breast cancer ([place]) 08/2004   Left breast invasive ductal carcinoma  . CHICKENPOX, HX OF 03/03/2010  . COMMON MIGRAINE 03/03/2010  . DEGENERATIVE DISC DISEASE 03/03/2010  . Essential hypertension, benign 03/03/2010  . Foot pain, bilateral 02/08/2016  . HYPOKALEMIA 03/03/2010  . Increased vitamin B12 level 01/10/2015  . Insomnia 08/01/2016  . Low back pain 08/01/2016  . MEASLES, HX OF 03/03/2010  . Melanoma of skin, site unspecified 03/03/2010  . 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 NEOPLASM, MALIGNANT, BREAST, HX OF 03/03/2010  . OSTEOPENIA 03/03/2010  . PALPITATIONS, HX OF 03/03/2010  . Pulmonary nodules 01/07/2015  . Rib fracture    left, 3 fractures s/p radiation: right side 2 fractured, no surgery   Past Surgical History:  Procedure Laterality Date  . BREAST LUMPECTOMY WITH NEEDLE LOCALIZATION AND AXILLARY SENTINEL LYMPH NODE BX Left 08/2004  . clavicle dislocate  74 yrs old   right, reapproximated w/pins, subsequently removed  . HERNIA REPAIR  05/2012  . left hip traumatic history     hematoma surgically evacuated  . SHOULDER ARTHROSCOPY     b/l shoulder with debridement  . TONSILLECTOMY     Family History  Problem Relation Age of Onset  . Hypertension Mother   . COPD Mother   . Alcohol abuse Father        history  of  . Coronary artery disease Father   . Osteoarthritis Brother        knees  . Basal cell carcinoma Sister   . Heart disease Maternal Grandmother   . Stroke Maternal Grandfather   . Osteoarthritis Sister        knees  . Migraines Sister    Social History   Socioeconomic History  . Marital status: Married    Spouse name: Not on file  . Number of children: Not on file  . Years of education: Not on file  . Highest education level: Not on file  Occupational History  . Not on file  Tobacco Use  . Smoking status: Former Smoker    Packs/day: 0.25    Quit date: 02/07/1968    Years since  quitting: 51.1  . Smokeless tobacco: Never Used  Substance and Sexual Activity  . Alcohol use: Yes    Comment: 1-2 glasses of wine per day  . Drug use: No  . 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 Not on file  Social History Narrative  . Not on file   Social Determinants of Health   Financial Resource Strain:   . Difficulty of Paying Living Expenses: Not on file  Food Insecurity:   . Worried About Charity fundraiser in the Last Year: Not on file  . Ran Out of Food in the Last Year: Not on file  Transportation Needs:   . Lack of Transportation (Medical): Not on file  . Lack of Transportation (Non-Medical): Not on file  Physical Activity:   . Days of Exercise per Week: Not on file  . Minutes of Exercise per Session: Not on file  Stress:   . Feeling of Stress : Not on file  Social Connections:   . Frequency of Communication with Friends and Family: Not on file  . Frequency of Social Gatherings with Friends and Family: Not on file  . Attends Religious Services: Not on file  . Active Member of Clubs or Organizations: Not on file  . Attends Archivist Meetings: Not on file  . Marital Status: Not on file    Outpatient Encounter Medications as of 03/31/2019  Medication Sig  . Ascorbic Acid (VITAMIN C) 1000 MG tablet Take 1,000 mg by mouth daily.  . Calcium-Vitamin D-Vitamin K (CALCIUM SOFT CHEWS PO) Take 1 tablet by mouth 2 (two) times daily.  . Cholecalciferol (VITAMIN D3) 1000 UNITS CAPS Take 1 capsule by mouth daily.  . famotidine (PEPCID) 40 MG tablet Take by mouth.  Marland Kitchen glucosamine-chondroitin 500-400 MG tablet Take 1 tablet by mouth daily.    . Lactobacillus (PROBIOTIC ACIDOPHILUS PO) Take by mouth.  . metoprolol succinate (TOPROL-XL) 25 MG 24 hr tablet TAKE 1 TABLET BY MOUTH EVERY DAY  . Vitamins-Lipotropics (B-50) TABS Take 1 tablet by  mouth daily.   Marland Kitchen EPINEPHrine 0.3 mg/0.3 mL IJ SOAJ injection INJECT 0.3 MLS (0.3 MG TOTAL) INTO THE MUSCLE ONCE FOR 1 DOSE.  . [DISCONTINUED] omeprazole (PRILOSEC) 20 MG capsule Take 20 mg by mouth daily.   No facility-administered encounter medications on file as of 03/31/2019.    Activities of Daily Living In your present state of health, do you have any difficulty performing the following activities: 03/31/2019  Hearing? N  Vision? N  Difficulty concentrating or making decisions? N  Walking or climbing stairs? N  Dressing or bathing? N  Doing errands, shopping? N  Preparing Food and eating ? N  Using the Toilet? N  In the past six months, have you accidently leaked urine? Y  Do you have problems with loss of bowel control? N  Managing your Medications? N  Managing your Finances? N  Some recent data might be hidden    Patient Care Team: [name], MD as PCP - General (Family Medicine) Danella Sensing, MD as Consulting Physician (Dermatology) Richmond Campbell, MD as Consulting Physician (Gastroenterology) Fay Records, MD as Consulting Physician (Cardiology) Linda Hedges, DO as Consulting Physician (Obstetrics and Gynecology) Kathie Rhodes, MD as Consulting Physician (Urology)    Assessment:   This is a routine wellness examination for Carrie [name]. Physical assessment deferred to PCP.   Exercise Activities and Dietary recommendations Current Exercise Habits: Home exercise routine, Type of exercise: walking;treadmill;strength training/weights, Time (Minutes): 60, Frequency (Times/Week): 5, Weekly Exercise (Minutes/Week): 300, Intensity: Mild, Exercise limited by: None identified   Diet (meal preparation, eat out, water intake, caffeinated beverages, dairy products, fruits and vegetables): in general, a "healthy" diet  , well balanced    Goals    . Healthy Lifestyle     Continue to eat heart healthy diet (full of fruits, vegetables, whole grains, lean protein,  water--limit salt, fat, and sugar intake) and increase physical activity as tolerated. Continue doing brain stimulating activities (puzzles, reading, adult coloring books, staying active) to keep memory sharp.         Fall Risk Fall Risk  03/31/2019 03/19/2018 12/24/2017 01/23/2017 01/21/2016  Falls in the past year? 0 0 0 No No  Comment - - Emmi Telephone Survey: data to providers prior to load - -  Number falls in past yr: 0 - - - -  Injury with Fall? 0 - - - -  Follow up Education provided;Falls prevention discussed - - - -   Depression Screen PHQ 2/9 Scores 03/31/2019 03/19/2018 01/23/2017 01/21/2016  PHQ - 2 Score 0 0 0 0     Cognitive Function Ad8 score reviewed for issues:  Issues making decisions:no  Less interest in hobbies / activities:no  Repeats questions, stories (family complaining):no  Trouble using ordinary gadgets (microwave, computer, phone):no  Forgets the month or year: no  Mismanaging finances: no  Remembering appts:no  Daily problems with thinking and/or memory:no Ad8 score is=0     MMSE - Mini Mental State Exam 01/21/2016  Orientation to time 5  Orientation to Place 5  Registration 3  Attention/ Calculation 5  Recall 3  Language- name 2 objects 2  Language- repeat 1  Language- follow 3 step command 3  Language- read & follow direction 1  Write a sentence 1  Copy design 1  Total score 30        Immunization History  Administered Date(s) Administered  . Fluad Quad(high Dose 65+) 10/10/2018  . Influenza Whole 10/07/2009  . Influenza, High Dose Seasonal PF 10/31/2017  . Influenza,inj,Quad PF,6+ Mos 01/07/2015  . Influenza-Unspecified 11/07/2015  . Pneumococcal Conjugate-13 12/16/2013  . Pneumococcal Polysaccharide-23 03/19/2018  . Tdap 06/21/2011   Screening Tests Health Maintenance  Topic Date Due  . MAMMOGRAM  03/21/2018  . TETANUS/TDAP  06/20/2021  . COLONOSCOPY  01/12/2026  . INFLUENZA VACCINE  Completed  . DEXA SCAN   Completed  . Hepatitis C Screening  Completed  . PNA vac Low Risk Adult  Completed     Plan:    Please schedule your next medicare wellness visit  with me in 1 yr.  Continue to eat heart healthy diet (full of fruits, vegetables, whole grains, lean protein, water--limit salt, fat, and sugar intake) and increase physical activity as tolerated.  Continue doing brain stimulating activities (puzzles, reading, adult coloring books, staying active) to keep memory sharp.   Bring a copy of your living will and/or healthcare power of attorney to your next office visit.  Please have Dr.Morris send a copy of your mammogram and bone density reports once done.   I have personally reviewed and noted the following in the patient's chart:   . Medical and social history . Use of alcohol, tobacco or illicit drugs  . Current medications and supplements . Functional ability and status . Nutritional status . Physical activity . Advanced directives . List of other physicians . Hospitalizations, surgeries, and ER visits in previous 12 months . Vitals . Screenings to include cognitive, depression, and falls . Referrals and appointments  In addition, I have reviewed and discussed with patient certain preventive protocols, quality metrics, and best practice recommendations. A written personalized care plan for preventive services as well as general preventive health recommendations were provided to patient.     Naaman Plummer [place], [place]  03/31/2019

## 2019-03-31 ENCOUNTER — Other Ambulatory Visit: Payer: Self-pay

## 2019-03-31 ENCOUNTER — Ambulatory Visit (INDEPENDENT_AMBULATORY_CARE_PROVIDER_SITE_OTHER): Payer: Medicare Other | Admitting: *Deleted

## 2019-03-31 ENCOUNTER — Encounter: Payer: Self-pay | Admitting: *Deleted

## 2019-03-31 DIAGNOSIS — Z0000 Encounter for general adult medical examination without abnormal findings: Secondary | ICD-10-CM

## 2019-03-31 NOTE — Patient Instructions (Signed)
Please schedule your next medicare wellness visit with me in 1 yr.  Continue to eat heart healthy diet (full of fruits, vegetables, whole grains, lean protein, water--limit salt, fat, and sugar intake) and increase physical activity as tolerated.  Continue doing brain stimulating activities (puzzles, reading, adult coloring books, staying active) to keep memory sharp.   Bring a copy of your living will and/or healthcare power of attorney to your next office visit.  Please have Dr.Morris send a copy of your mammogram and bone density reports once done.    Carrie [name] , Thank you for taking time to come for your Medicare Wellness Visit. I appreciate your ongoing commitment to your health goals. Please review the following plan we discussed and let me know if I can assist you in the future.   These are the goals we discussed: Goals    . Healthy Lifestyle     Continue to eat heart healthy diet (full of fruits, vegetables, whole grains, lean protein, water--limit salt, fat, and sugar intake) and increase physical activity as tolerated. Continue doing brain stimulating activities (puzzles, reading, adult coloring books, staying active) to keep memory sharp.         This is a list of the screening recommended for you and due dates:  Health Maintenance  Topic Date Due  . Mammogram  03/21/2018  . Tetanus Vaccine  06/20/2021  . Colon Cancer Screening  01/12/2026  . Flu Shot  Completed  . DEXA scan (bone density measurement)  Completed  .  Hepatitis C: One time screening is recommended by Center for Disease Control  (CDC) for  adults born from [birth year] through 1963.   Completed  . Pneumonia vaccines  Completed    Preventive Care 72 Years and Older, Female Preventive care refers to lifestyle choices and visits with your health care provider that can promote health and wellness. This includes:  A yearly physical exam. This is also called an annual well check.  Regular dental and eye  exams.  Immunizations.  Screening for certain conditions.  Healthy lifestyle choices, such as diet and exercise. What can I expect for my preventive care visit? Physical exam Your health care provider will check:  Height and weight. These may be used to calculate body mass index (BMI), which is a measurement that tells if you are at a healthy weight.  Heart rate and blood pressure.  Your skin for abnormal spots. Counseling Your health care provider may ask you questions about:  Alcohol, tobacco, and drug use.  Emotional well-being.  Home and relationship well-being.  Sexual activity.  Eating habits.  History of falls.  Memory and ability to understand (cognition).  Work and work Statistician.  Pregnancy and menstrual history. What immunizations do I need?  Influenza (flu) vaccine  This is recommended every year. Tetanus, diphtheria, and pertussis (Tdap) vaccine  You may need a Td booster every 10 years. Varicella (chickenpox) vaccine  You may need this vaccine if you have not already been vaccinated. Zoster (shingles) vaccine  You may need this after age 45. Pneumococcal conjugate (PCV13) vaccine  One dose is recommended after age 72. Pneumococcal polysaccharide (PPSV23) vaccine  One dose is recommended after age 72. Measles, mumps, and rubella (MMR) vaccine  You may need at least one dose of MMR if you were born in 1957 or later. You may also need a second dose. Meningococcal conjugate (MenACWY) vaccine  You may need this if you have certain conditions. Hepatitis A vaccine  You may  need this if you have certain conditions or if you travel or work in places where you may be exposed to hepatitis A. Hepatitis B vaccine  You may need this if you have certain conditions or if you travel or work in places where you may be exposed to hepatitis B. Haemophilus influenzae type b (Hib) vaccine  You may need this if you have certain conditions. You may  receive vaccines as individual doses or as more than one vaccine together in one shot (combination vaccines). Talk with your health care provider about the risks and benefits of combination vaccines. What tests do I need? Blood tests  Lipid and cholesterol levels. These may be checked every 5 years, or more frequently depending on your overall health.  Hepatitis C test.  Hepatitis B test. Screening  Lung cancer screening. You may have this screening every year starting at age 72 if you have a 30-pack-year history of smoking and currently smoke or have quit within the past 15 years.  Colorectal cancer screening. All adults should have this screening starting at age 72 and continuing until age 34. Your health care provider may recommend screening at age 39 if you are at increased risk. You will have tests every 1-10 years, depending on your results and the type of screening test.  Diabetes screening. This is done by checking your blood sugar (glucose) after you have not eaten for a while (fasting). You may have this done every 1-3 years.  Mammogram. This may be done every 1-2 years. Talk with your health care provider about how often you should have regular mammograms.  BRCA-related cancer screening. This may be done if you have a family history of breast, ovarian, tubal, or peritoneal cancers. Other tests  Sexually transmitted disease (STD) testing.  Bone density scan. This is done to screen for osteoporosis. You may have this done starting at age 12. Follow these instructions at home: Eating and drinking  Eat a diet that includes fresh fruits and vegetables, whole grains, lean protein, and low-fat dairy products. Limit your intake of foods with high amounts of sugar, saturated fats, and salt.  Take vitamin and mineral supplements as recommended by your health care provider.  Do not drink alcohol if your health care provider tells you not to drink.  If you drink alcohol: ? Limit how  much you have to 0-1 drink a day. ? Be aware of how much alcohol is in your drink. In the [place] one drink equals one 12 oz bottle of beer (355 mL), one 5 oz glass of wine (148 mL), or one 1 oz glass of hard liquor (44 mL). Lifestyle  Take daily care of your teeth and gums.  Stay active. Exercise for at least 30 minutes on 5 or more days each week.  Do not use any products that contain nicotine or tobacco, such as cigarettes, e-cigarettes, and chewing tobacco. If you need help quitting, ask your health care provider.  If you are sexually active, practice safe sex. Use a condom or other form of protection in order to prevent STIs (sexually transmitted infections).  Talk with your health care provider about taking a low-dose aspirin or statin. What's next?  Go to your health care provider once a year for a well check visit.  Ask your health care provider how often you should have your eyes and teeth checked.  Stay up to date on all vaccines. This information is not intended to replace advice given to you by your  health care provider. Make sure you discuss any questions you have with your health care provider. Document Revised: 01/17/2018 Document Reviewed: 01/17/2018 Elsevier Patient Education  2020 [employer].

## 2019-05-15 ENCOUNTER — Ambulatory Visit: Payer: Medicare Other | Admitting: Family Medicine

## 2019-05-20 ENCOUNTER — Telehealth (INDEPENDENT_AMBULATORY_CARE_PROVIDER_SITE_OTHER): Payer: Medicare Other | Admitting: Family Medicine

## 2019-05-20 ENCOUNTER — Other Ambulatory Visit: Payer: Self-pay

## 2019-05-20 DIAGNOSIS — F419 Anxiety disorder, unspecified: Secondary | ICD-10-CM | POA: Diagnosis not present

## 2019-05-20 DIAGNOSIS — G4700 Insomnia, unspecified: Secondary | ICD-10-CM | POA: Diagnosis not present

## 2019-05-20 DIAGNOSIS — E785 Hyperlipidemia, unspecified: Secondary | ICD-10-CM | POA: Diagnosis not present

## 2019-05-20 DIAGNOSIS — I10 Essential (primary) hypertension: Secondary | ICD-10-CM

## 2019-05-21 NOTE — Assessment & Plan Note (Signed)
Monitor and report any concerns. no changes to meds. Encouraged heart healthy diet such as the DASH diet and exercise as tolerated.  

## 2019-05-21 NOTE — Assessment & Plan Note (Signed)
She has been managing well during the pandemic and has cut down on alcohol intake and is oonly drinking 2-3 beverages weekly

## 2019-05-21 NOTE — Assessment & Plan Note (Signed)
Encouraged good sleep hygiene such as dark, quiet room. No blue/green glowing lights such as computer screens in bedroom. No alcohol or stimulants in evening. Cut down on caffeine as able. Regular exercise is helpful but not just prior to bed time. Continues to be an ongoing struggle. May use Melatonin, Alprazolam prn and add Magnesium Glycinate qhs.

## 2019-05-21 NOTE — Progress Notes (Signed)
Virtual Visit via Video Note  I connected with Carrie [name] on 05/20/19 at  3:00 PM EDT by a video enabled telemedicine application and verified that I am speaking with the correct person using two identifiers.  Location: Patient: home Provider: office   I discussed the limitations of evaluation and management by telemedicine and the availability of in person appointments. The patient expressed understanding and agreed to proceed. Kem Boroughs, CMA was able to get the patient setup on a video visit   Subjective:    Patient ID: Carrie [name], female    DOB: Apr 12, 1945, 74 y.o.   MRN: [id number]  Chief Complaint  Patient presents with  . Follow-up  . would like to discuss whether or not she needs epi pen refil    HPI Patient is in today for follow up on chronic medical concerns. She feels well. No recent febrile illness or hospitalizations. She is staying active and eating well. Is enoying her grand daughter. She has had her COVID vaccines and tolerated them well. She is drinking very little alcohol presently and feels well. Denies CP/palp/SOB/HA/congestion/fevers/GI or GU c/o. Taking meds as prescribed  Past Medical History:  Diagnosis Date  . Breast cancer ([place]) 08/2004   Left breast invasive ductal carcinoma  . CHICKENPOX, HX OF 03/03/2010  . COMMON MIGRAINE 03/03/2010  . DEGENERATIVE DISC DISEASE 03/03/2010  . Essential hypertension, benign 03/03/2010  . Foot pain, bilateral 02/08/2016  . HYPOKALEMIA 03/03/2010  . Increased vitamin B12 level 01/10/2015  . Insomnia 08/01/2016  . Low back pain 08/01/2016  . MEASLES, HX OF 03/03/2010  . Melanoma of skin, site unspecified 03/03/2010  . 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 NEOPLASM, MALIGNANT, BREAST, HX OF 03/03/2010  . OSTEOPENIA 03/03/2010  . PALPITATIONS, HX OF 03/03/2010  . Pulmonary nodules 01/07/2015  . Rib fracture    left, 3 fractures s/p radiation: right side 2 fractured, no surgery    Past Surgical History:  Procedure  Laterality Date  . BREAST LUMPECTOMY WITH NEEDLE LOCALIZATION AND AXILLARY SENTINEL LYMPH NODE BX Left 08/2004  . clavicle dislocate  74 yrs old   right, reapproximated w/pins, subsequently removed  . HERNIA REPAIR  05/2012  . left hip traumatic history     hematoma surgically evacuated  . SHOULDER ARTHROSCOPY     b/l shoulder with debridement  . TONSILLECTOMY      Family History  Problem Relation Age of Onset  . Hypertension Mother   . COPD Mother   . Alcohol abuse Father        history of  . Coronary artery disease Father   . Osteoarthritis Brother        knees  . Basal cell carcinoma Sister   . Heart disease Maternal Grandmother   . Stroke Maternal Grandfather   . Osteoarthritis Sister        knees  . Migraines Sister     Social History   Socioeconomic History  . Marital status: Married    Spouse name: Not on file  . Number of children: Not on file  . Years of education: Not on file  . Highest education level: Not on file  Occupational History  . Not on file  Tobacco Use  . Smoking status: Former Smoker    Packs/day: 0.25    Quit date: 02/07/1968    Years since quitting: 51.3  . Smokeless tobacco: Never Used  Substance and Sexual Activity  . Alcohol use: Yes    Comment: 1-2 glasses of wine  per day  . Drug use: No  . 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 Not on file  Social History Narrative  . Not on file   Social Determinants of Health   Financial Resource Strain: Low Risk   . Difficulty of Paying Living Expenses: Not hard at all  Food Insecurity: No Food Insecurity  . Worried About Charity fundraiser in the Last Year: Never true  . Ran Out of Food in the Last Year: Never true  Transportation Needs: No Transportation Needs  . Lack of Transportation (Medical): No  . Lack of Transportation (Non-Medical): No  Physical  Activity:   . Days of Exercise per Week:   . Minutes of Exercise per Session:   Stress:   . Feeling of Stress :   Social Connections:   . Frequency of Communication with Friends and Family:   . Frequency of Social Gatherings with Friends and Family:   . Attends Religious Services:   . Active Member of Clubs or Organizations:   . Attends Archivist Meetings:   Marland Kitchen Marital Status:   Intimate Partner Violence:   . Fear of Current or Ex-Partner:   . Emotionally Abused:   Marland Kitchen Physically Abused:   . Sexually Abused:     Outpatient Medications Prior to Visit  Medication Sig Dispense Refill  . Ascorbic Acid (VITAMIN C) 1000 MG tablet Take 1,000 mg by mouth daily.    . Calcium-Vitamin D-Vitamin K (CALCIUM SOFT CHEWS PO) Take 1 tablet by mouth 2 (two) times daily.    . Cholecalciferol (VITAMIN D3) 1000 UNITS CAPS Take 1 capsule by mouth daily.    Marland Kitchen EPINEPHrine 0.3 mg/0.3 mL IJ SOAJ injection INJECT 0.3 MLS (0.3 MG TOTAL) INTO THE MUSCLE ONCE FOR 1 DOSE.  1  . famotidine (PEPCID) 40 MG tablet Take by mouth.    Marland Kitchen glucosamine-chondroitin 500-400 MG tablet Take 1 tablet by mouth daily.      . Lactobacillus (PROBIOTIC ACIDOPHILUS PO) Take by mouth.    . metoprolol succinate (TOPROL-XL) 25 MG 24 hr tablet TAKE 1 TABLET BY MOUTH EVERY DAY 90 tablet 1  . Vitamins-Lipotropics (B-50) TABS Take 1 tablet by mouth daily.      No facility-administered medications prior to visit.    Allergies  Allergen Reactions  . Hydrocodone-Acetaminophen Other (See Comments)    Slows Respiration Rate Down  . Codeine     REACTION: upsets stomach  . Demerol     hives  . Meperidine Hcl     REACTION: Hives    Review of Systems  Constitutional: Negative for fever and malaise/fatigue.  HENT: Negative for congestion.   Eyes: Negative for blurred vision.  Respiratory: Negative for shortness of breath.   Cardiovascular: Negative for chest pain, palpitations and leg swelling.  Gastrointestinal: Negative for  abdominal pain, blood in stool and nausea.  Genitourinary: Negative for dysuria and frequency.  Musculoskeletal: Negative for falls.  Skin: Negative for rash.  Neurological: Negative for dizziness, loss of consciousness and headaches.  Endo/Heme/Allergies: Negative for environmental allergies.  Psychiatric/Behavioral: Negative for depression. The patient is not nervous/anxious.        Objective:    Physical Exam Constitutional:      Appearance: Normal appearance. She is not ill-appearing.  HENT:     Head: Normocephalic and atraumatic.     Right Ear: External ear normal.  Left Ear: External ear normal.     Nose: Nose normal.  Eyes:     General:        Right eye: No discharge.        Left eye: No discharge.  Pulmonary:     Effort: Pulmonary effort is normal.  Neurological:     Mental Status: She is alert and oriented to person, place, and time.  Psychiatric:        Behavior: Behavior normal.     BP 112/72   Pulse 62   Temp 97.6 F (36.4 C)   Wt 126 lb 6.4 oz (57.3 kg)   BMI 20.40 kg/m  Wt Readings from Last 3 Encounters:  05/20/19 126 lb 6.4 oz (57.3 kg)  03/19/18 132 lb (59.9 kg)  03/19/18 132 lb (59.9 kg)    Diabetic Foot Exam - Simple   No data filed     Lab Results  Component Value Date   WBC 4.8 12/27/2018   HGB 13.8 12/27/2018   HCT 40.8 12/27/2018   PLT 208.0 12/27/2018   GLUCOSE 88 10/10/2018   CHOL 207 (H) 10/10/2018   TRIG 78.0 10/10/2018   HDL 94.60 10/10/2018   LDLDIRECT 94.8 07/28/2010   LDLCALC 97 10/10/2018   ALT 15 10/10/2018   AST 18 10/10/2018   NA 140 10/10/2018   K 4.3 10/10/2018   CL 103 10/10/2018   CREATININE 0.64 10/10/2018   BUN 18 10/10/2018   CO2 30 10/10/2018   TSH 1.09 10/10/2018   HGBA1C 5.5 09/25/2017    Lab Results  Component Value Date   TSH 1.09 10/10/2018   Lab Results  Component Value Date   WBC 4.8 12/27/2018   HGB 13.8 12/27/2018   HCT 40.8 12/27/2018   MCV 102.5 (H) 12/27/2018   PLT 208.0  12/27/2018   Lab Results  Component Value Date   NA 140 10/10/2018   K 4.3 10/10/2018   CO2 30 10/10/2018   GLUCOSE 88 10/10/2018   BUN 18 10/10/2018   CREATININE 0.64 10/10/2018   BILITOT 0.7 10/10/2018   ALKPHOS 152 (H) 10/10/2018   AST 18 10/10/2018   ALT 15 10/10/2018   PROT 6.5 10/10/2018   ALBUMIN 4.3 10/10/2018   CALCIUM 9.3 10/10/2018   GFR 91.45 10/10/2018   Lab Results  Component Value Date   CHOL 207 (H) 10/10/2018   Lab Results  Component Value Date   HDL 94.60 10/10/2018   Lab Results  Component Value Date   LDLCALC 97 10/10/2018   Lab Results  Component Value Date   TRIG 78.0 10/10/2018   Lab Results  Component Value Date   CHOLHDL 2 10/10/2018   Lab Results  Component Value Date   HGBA1C 5.5 09/25/2017       Assessment & Plan:   Problem List Items Addressed This Visit    ESSENTIAL HYPERTENSION, BENIGN    Monitor and report any concerns. no changes to meds. Encouraged heart healthy diet such as the DASH diet and exercise as tolerated.       Insomnia    Encouraged good sleep hygiene such as dark, quiet room. No blue/green glowing lights such as computer screens in bedroom. No alcohol or stimulants in evening. Cut down on caffeine as able. Regular exercise is helpful but not just prior to bed time. Continues to be an ongoing struggle. May use Melatonin, Alprazolam prn and add Magnesium Glycinate qhs.       Anxiety    She has been  managing well during the pandemic and has cut down on alcohol intake and is oonly drinking 2-3 beverages weekly      Hyperlipidemia    Encouraged heart healthy diet, increase exercise, avoid trans fats, consider a krill oil cap daily         I am having Shandrika A. Owens Shark "Chris" maintain her B-50, glucosamine-chondroitin, Calcium-Vitamin D-Vitamin K (CALCIUM SOFT CHEWS PO), Vitamin D3, vitamin C, EPINEPHrine, Lactobacillus (PROBIOTIC ACIDOPHILUS PO), metoprolol succinate, and famotidine.  No orders of the  defined types were placed in this encounter.    Penni Homans, MD     I discussed the assessment and treatment plan with the patient. The patient was provided an opportunity to ask questions and all were answered. The patient agreed with the plan and demonstrated an understanding of the instructions.   The patient was advised to call back or seek an in-person evaluation if the symptoms worsen or if the condition fails to improve as anticipated.  I provided 25 minutes of non-face-to-face time during this encounter.   Penni Homans, MD

## 2019-05-21 NOTE — Assessment & Plan Note (Signed)
Encouraged heart healthy diet, increase exercise, avoid trans fats, consider a krill oil cap daily 

## 2019-05-22 DIAGNOSIS — L820 Inflamed seborrheic keratosis: Secondary | ICD-10-CM | POA: Diagnosis not present

## 2019-05-22 DIAGNOSIS — Z85828 Personal history of other malignant neoplasm of skin: Secondary | ICD-10-CM | POA: Diagnosis not present

## 2019-06-09 DIAGNOSIS — Z85828 Personal history of other malignant neoplasm of skin: Secondary | ICD-10-CM | POA: Diagnosis not present

## 2019-06-09 DIAGNOSIS — L570 Actinic keratosis: Secondary | ICD-10-CM | POA: Diagnosis not present

## 2019-06-09 DIAGNOSIS — L814 Other melanin hyperpigmentation: Secondary | ICD-10-CM | POA: Diagnosis not present

## 2019-06-09 DIAGNOSIS — L565 Disseminated superficial actinic porokeratosis (DSAP): Secondary | ICD-10-CM | POA: Diagnosis not present

## 2019-06-09 DIAGNOSIS — Z85820 Personal history of malignant melanoma of skin: Secondary | ICD-10-CM | POA: Diagnosis not present

## 2019-06-09 DIAGNOSIS — D1801 Hemangioma of skin and subcutaneous tissue: Secondary | ICD-10-CM | POA: Diagnosis not present

## 2019-06-09 DIAGNOSIS — L821 Other seborrheic keratosis: Secondary | ICD-10-CM | POA: Diagnosis not present

## 2019-06-18 DIAGNOSIS — Z6821 Body mass index (BMI) 21.0-21.9, adult: Secondary | ICD-10-CM | POA: Diagnosis not present

## 2019-06-18 DIAGNOSIS — Z01419 Encounter for gynecological examination (general) (routine) without abnormal findings: Secondary | ICD-10-CM | POA: Diagnosis not present

## 2019-06-18 DIAGNOSIS — Z1231 Encounter for screening mammogram for malignant neoplasm of breast: Secondary | ICD-10-CM | POA: Diagnosis not present

## 2019-06-18 DIAGNOSIS — N958 Other specified menopausal and perimenopausal disorders: Secondary | ICD-10-CM | POA: Diagnosis not present

## 2019-06-18 DIAGNOSIS — M8588 Other specified disorders of bone density and structure, other site: Secondary | ICD-10-CM | POA: Diagnosis not present

## 2019-06-18 LAB — HM DEXA SCAN

## 2019-06-18 LAB — HM MAMMOGRAPHY

## 2019-06-19 ENCOUNTER — Encounter: Payer: Self-pay | Admitting: Family Medicine

## 2019-06-19 ENCOUNTER — Other Ambulatory Visit: Payer: Self-pay | Admitting: Family Medicine

## 2019-06-19 MED ORDER — METOPROLOL SUCCINATE ER 50 MG PO TB24: 50.0000 mg | ORAL_TABLET | Freq: Every day | ORAL | 1 refills | Status: DC

## 2019-06-20 ENCOUNTER — Other Ambulatory Visit: Payer: Self-pay | Admitting: *Deleted

## 2019-06-20 DIAGNOSIS — I10 Essential (primary) hypertension: Secondary | ICD-10-CM

## 2019-06-20 NOTE — Telephone Encounter (Signed)
Appointment set up for lab and nurse visit.

## 2019-06-24 ENCOUNTER — Encounter: Payer: Self-pay | Admitting: *Deleted

## 2019-07-01 ENCOUNTER — Other Ambulatory Visit (INDEPENDENT_AMBULATORY_CARE_PROVIDER_SITE_OTHER): Payer: Medicare Other

## 2019-07-01 ENCOUNTER — Other Ambulatory Visit: Payer: Self-pay

## 2019-07-01 ENCOUNTER — Ambulatory Visit: Payer: Medicare Other | Admitting: Family Medicine

## 2019-07-01 VITALS — BP 146/88 | HR 54

## 2019-07-01 DIAGNOSIS — I10 Essential (primary) hypertension: Secondary | ICD-10-CM

## 2019-07-01 LAB — COMPREHENSIVE METABOLIC PANEL
ALT: 14 U/L (ref 0–35)
AST: 18 U/L (ref 0–37)
Albumin: 4.3 g/dL (ref 3.5–5.2)
Alkaline Phosphatase: 159 U/L — ABNORMAL HIGH (ref 39–117)
BUN: 17 mg/dL (ref 6–23)
CO2: 31 mEq/L (ref 19–32)
Calcium: 9.7 mg/dL (ref 8.4–10.5)
Chloride: 101 mEq/L (ref 96–112)
Creatinine, Ser: 0.7 mg/dL (ref 0.40–1.20)
GFR: 82.3 mL/min (ref >60.00)
Glucose, Bld: 91 mg/dL (ref 70–99)
Potassium: 4.9 mEq/L (ref 3.5–5.1)
Sodium: 139 mEq/L (ref 135–145)
Total Bilirubin: 0.6 mg/dL (ref 0.2–1.2)
Total Protein: 6.4 g/dL (ref 6.0–8.3)

## 2019-07-01 LAB — CBC
HCT: 41.1 % (ref 36.0–46.0)
Hemoglobin: 13.8 g/dL (ref 12.0–15.0)
MCHC: 33.7 g/dL (ref 30.0–36.0)
MCV: 101.1 fl — ABNORMAL HIGH (ref 78.0–100.0)
Platelets: 194 10*3/uL (ref 150.0–400.0)
RBC: 4.06 Mil/uL (ref 3.87–5.11)
RDW: 13.6 % (ref 11.5–15.5)
WBC: 5.3 10*3/uL (ref 4.0–10.5)

## 2019-07-01 LAB — TSH: TSH: 2 u[IU]/mL (ref 0.35–4.50)

## 2019-07-01 NOTE — Progress Notes (Signed)
Pt here for Blood pressure check per Dr. Charlett Blake  Pt currently takes: metoprolol 50 mg    Pt reports compliance with medication.  BP today @ = 54  HR = 146/88   Please advise on blood pressure changes or not. She states her blood pressure has been running 130s/80s at home.   I will call with instructions.

## 2019-07-01 NOTE — Telephone Encounter (Signed)
She did not discuss this with me at nurse visit today? Did  you discuss sleep concerns at patients last visit?

## 2019-07-01 NOTE — Progress Notes (Signed)
No changes she can just continue to monitor  Nursing note reviewed. Agree with documention and plan.

## 2019-07-04 ENCOUNTER — Other Ambulatory Visit: Payer: Self-pay | Admitting: Family Medicine

## 2019-07-04 MED ORDER — ZOLPIDEM TARTRATE 10 MG PO TABS: 10.0000 mg | ORAL_TABLET | Freq: Every evening | ORAL | 3 refills | Status: DC | PRN

## 2019-07-09 ENCOUNTER — Encounter: Payer: Self-pay | Admitting: *Deleted

## 2019-07-11 ENCOUNTER — Telehealth: Payer: Self-pay | Admitting: *Deleted

## 2019-07-11 NOTE — Telephone Encounter (Signed)
Patient notified of bone density results.  Per Dr. Charlett Blake notify osteopenia, stay active and take calcium tid.

## 2019-07-13 ENCOUNTER — Encounter: Payer: Self-pay | Admitting: Family Medicine

## 2019-07-31 ENCOUNTER — Encounter: Payer: Self-pay | Admitting: Family Medicine

## 2019-08-26 ENCOUNTER — Encounter: Payer: Self-pay | Admitting: Family Medicine

## 2019-09-18 ENCOUNTER — Encounter: Payer: Self-pay | Admitting: Family Medicine

## 2019-09-25 ENCOUNTER — Encounter: Payer: Self-pay | Admitting: Family Medicine

## 2019-11-10 ENCOUNTER — Encounter: Payer: Self-pay | Admitting: Family Medicine

## 2019-11-16 DIAGNOSIS — Z23 Encounter for immunization: Secondary | ICD-10-CM | POA: Diagnosis not present

## 2019-11-25 DIAGNOSIS — Z23 Encounter for immunization: Secondary | ICD-10-CM | POA: Diagnosis not present

## 2019-12-02 ENCOUNTER — Encounter: Payer: Self-pay | Admitting: Family Medicine

## 2019-12-03 NOTE — Telephone Encounter (Signed)
Called and scheduled pt for 12/23 virtual appt. Only opening With in 8 weeks

## 2019-12-04 DIAGNOSIS — K219 Gastro-esophageal reflux disease without esophagitis: Secondary | ICD-10-CM | POA: Diagnosis not present

## 2019-12-11 ENCOUNTER — Other Ambulatory Visit: Payer: Self-pay | Admitting: Family Medicine

## 2020-01-29 ENCOUNTER — Telehealth: Payer: Medicare Other | Admitting: Family Medicine

## 2020-02-16 ENCOUNTER — Other Ambulatory Visit: Payer: Self-pay | Admitting: Radiology

## 2020-02-16 ENCOUNTER — Other Ambulatory Visit (HOSPITAL_COMMUNITY): Payer: Self-pay | Admitting: Radiology

## 2020-02-16 DIAGNOSIS — R1032 Left lower quadrant pain: Secondary | ICD-10-CM

## 2020-02-19 ENCOUNTER — Telehealth (INDEPENDENT_AMBULATORY_CARE_PROVIDER_SITE_OTHER): Payer: Medicare Other | Admitting: Family Medicine

## 2020-02-19 ENCOUNTER — Other Ambulatory Visit: Payer: Self-pay

## 2020-02-19 ENCOUNTER — Telehealth: Payer: Self-pay

## 2020-02-19 DIAGNOSIS — E538 Deficiency of other specified B group vitamins: Secondary | ICD-10-CM

## 2020-02-19 DIAGNOSIS — I10 Essential (primary) hypertension: Secondary | ICD-10-CM | POA: Diagnosis not present

## 2020-02-19 DIAGNOSIS — E785 Hyperlipidemia, unspecified: Secondary | ICD-10-CM

## 2020-02-19 DIAGNOSIS — R748 Abnormal levels of other serum enzymes: Secondary | ICD-10-CM

## 2020-02-19 DIAGNOSIS — R109 Unspecified abdominal pain: Secondary | ICD-10-CM | POA: Diagnosis not present

## 2020-02-19 DIAGNOSIS — R918 Other nonspecific abnormal finding of lung field: Secondary | ICD-10-CM

## 2020-02-19 DIAGNOSIS — M545 Low back pain, unspecified: Secondary | ICD-10-CM

## 2020-02-19 DIAGNOSIS — G4700 Insomnia, unspecified: Secondary | ICD-10-CM | POA: Diagnosis not present

## 2020-02-19 MED ORDER — ZOLPIDEM TARTRATE 5 MG PO TABS: 5.0000 mg | ORAL_TABLET | Freq: Every evening | ORAL | 2 refills | Status: DC | PRN

## 2020-02-19 MED ORDER — EPINEPHRINE 0.3 MG/0.3ML IJ SOAJ
INTRAMUSCULAR | 1 refills | Status: DC
Start: 2020-02-19 — End: 2020-02-19

## 2020-02-19 MED ORDER — EPINEPHRINE 0.3 MG/0.3ML IJ SOAJ: 0.3000 mg | INTRAMUSCULAR | 2 refills | Status: DC | PRN

## 2020-02-19 NOTE — Telephone Encounter (Signed)
Attempted to call pt to get her scheduled for 03/08/20 for her lab appointment and physical in 6 months

## 2020-02-22 DIAGNOSIS — R109 Unspecified abdominal pain: Secondary | ICD-10-CM | POA: Insufficient documentation

## 2020-02-22 NOTE — Assessment & Plan Note (Signed)
Encouraged heart healthy diet, increase exercise, avoid trans fats, consider a krill oil cap daily 

## 2020-02-22 NOTE — Assessment & Plan Note (Signed)
She notes discomfort and a feeling that there is a suture under the surface where she previously had an abdominal hernia repaired by Dr Damita Lack a surgeon at [facility] many years ago. She has been evaluated by GYN and they report she has not notable pathology. She is going to try and contact her surgeon to discuss getting evaluated.

## 2020-02-22 NOTE — Assessment & Plan Note (Signed)
Monitor and report any concerns, no changes to meds. Encouraged heart healthy diet such as the DASH diet and exercise as tolerated.  ?

## 2020-02-22 NOTE — Assessment & Plan Note (Signed)
She will hydrate well prior to next visit and we will continue to monitor

## 2020-02-22 NOTE — Assessment & Plan Note (Signed)
Worsening, proceed with Xray and possible MRI. Encouraged moist heat and gentle stretching as tolerated. May try NSAIDs and prescription meds as directed and report if symptoms worsen or seek immediate care

## 2020-02-22 NOTE — Assessment & Plan Note (Signed)
Refill given on Ambien to use prn. Encouraged good sleep hygiene such as dark, quiet room. No blue/green glowing lights such as computer screens in bedroom. No alcohol or stimulants in evening. Cut down on caffeine as able. Regular exercise is helpful but not just prior to bed time.

## 2020-02-22 NOTE — Progress Notes (Signed)
Virtual Visit via Video Note  I connected with Carrie [name] on 02/19/20 at  3:00 PM EST by a video enabled telemedicine application and verified that I am speaking with the correct person using two identifiers.  Location: Patient: home, patient and provider in visit Provider: office   I discussed the limitations of evaluation and management by telemedicine and the availability of in person appointments. The patient expressed understanding and agreed to proceed. S Chism, CMA  was able to get the patient set up on a phone visit after being unable to set up a video visit   Subjective:    Patient ID: Carrie [name], female    DOB: 1945/08/10, 75 y.o.   MRN: [id number]  Chief Complaint  Patient presents with  . Follow-up    HPI Patient is in today for follow upon chronic medical concerns. No recent febrile illness or hospitalizations. She notes discomfort and a feeling that there is a suture under the surface where she previously had an abdominal hernia repaired by Dr Lowella Petties a surgeon at [facility] many years ago. She has been evaluated by GYN and they report she has not notable pathology. Her bowels are moving well and no urinary complaints. No blood noted. No anorexia. She is noting increasing pain low back but denies any trauma, falls or incontinence. Denies CP/palp/SOB/HA/congestion/fevers/GI or GU c/o. Taking meds as prescribed  Past Medical History:  Diagnosis Date  . Breast cancer (HCC) 08/2004   Left breast invasive ductal carcinoma  . CHICKENPOX, HX OF 03/03/2010  . COMMON MIGRAINE 03/03/2010  . DEGENERATIVE DISC DISEASE 03/03/2010  . Essential hypertension, benign 03/03/2010  . Foot pain, bilateral 02/08/2016  . HYPOKALEMIA 03/03/2010  . Increased vitamin B12 level 01/10/2015  . Insomnia 08/01/2016  . Low back pain 08/01/2016  . MEASLES, HX OF 03/03/2010  . Melanoma of skin, site unspecified 03/03/2010  . 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 NEOPLASM, MALIGNANT, BREAST, HX OF  03/03/2010  . OSTEOPENIA 03/03/2010  . PALPITATIONS, HX OF 03/03/2010  . Pulmonary nodules 01/07/2015  . Rib fracture    left, 3 fractures s/p radiation: right side 2 fractured, no surgery    Past Surgical History:  Procedure Laterality Date  . BREAST LUMPECTOMY WITH NEEDLE LOCALIZATION AND AXILLARY SENTINEL LYMPH NODE BX Left 08/2004  . clavicle dislocate  75 yrs old   right, reapproximated w/pins, subsequently removed  . HERNIA REPAIR  05/2012  . left hip traumatic history     hematoma surgically evacuated  . SHOULDER ARTHROSCOPY     b/l shoulder with debridement  . TONSILLECTOMY      Family History  Problem Relation Age of Onset  . Hypertension Mother   . COPD Mother   . Alcohol abuse Father        history of  . Coronary artery disease Father   . Osteoarthritis Brother        knees  . Basal cell carcinoma Sister   . Heart disease Maternal Grandmother   . Stroke Maternal Grandfather   . Osteoarthritis Sister        knees  . Migraines Sister     Social History   Socioeconomic History  . Marital status: Married    Spouse name: Not on file  . Number of children: Not on file  . Years of education: Not on file  . Highest education level: Not on file  Occupational History  . Not on file  Tobacco Use  . Smoking status: Former Smoker  Packs/day: 0.25    Quit date: 02/07/1968    Years since quitting: 52.0  . Smokeless tobacco: Never Used  Vaping Use  . Vaping Use: Never used  Substance and Sexual Activity  . Alcohol use: Yes    Comment: 1-2 glasses of wine per day  . Drug use: No  . 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 Not on file  Social History Narrative  . Not on file   Social Determinants of Health   Financial Resource Strain: Low Risk   . Difficulty of Paying Living Expenses: Not hard at all  Food Insecurity: No Food  Insecurity  . Worried About Charity fundraiser in the Last Year: Never true  . Ran Out of Food in the Last Year: Never true  Transportation Needs: No Transportation Needs  . Lack of Transportation (Medical): No  . Lack of Transportation (Non-Medical): No  Physical Activity: Not on file  Stress: Not on file  Social Connections: Not on file  Intimate Partner Violence: Not on file    Outpatient Medications Prior to Visit  Medication Sig Dispense Refill  . Ascorbic Acid (VITAMIN C) 1000 MG tablet Take 1,000 mg by mouth daily.    . Calcium-Vitamin D-Vitamin K (CALCIUM SOFT CHEWS PO) Take 1 tablet by mouth 2 (two) times daily.    . Cholecalciferol (VITAMIN D3) 1000 UNITS CAPS Take 1 capsule by mouth daily.    . famotidine (PEPCID) 40 MG tablet Take by mouth.    Marland Kitchen glucosamine-chondroitin 500-400 MG tablet Take 1 tablet by mouth daily.    . Lactobacillus (PROBIOTIC ACIDOPHILUS PO) Take by mouth.    . metoprolol succinate (TOPROL-XL) 50 MG 24 hr tablet TAKE 1 TABLET (50 MG TOTAL) BY MOUTH DAILY. TAKE WITH OR IMMEDIATELY FOLLOWING A MEAL. 90 tablet 1  . Vitamins-Lipotropics (B-50) TABS Take 1 tablet by mouth daily.    Marland Kitchen EPINEPHrine 0.3 mg/0.3 mL IJ SOAJ injection INJECT 0.3 MLS (0.3 MG TOTAL) INTO THE MUSCLE ONCE FOR 1 DOSE.  1  . zolpidem (AMBIEN) 10 MG tablet Take 1 tablet (10 mg total) by mouth at bedtime as needed for sleep. 15 tablet 3   No facility-administered medications prior to visit.    Allergies  Allergen Reactions  . Hydrocodone-Acetaminophen Other (See Comments)    Slows Respiration Rate Down  . Codeine     REACTION: upsets stomach  . Demerol     hives  . Meperidine Hcl     REACTION: Hives    Review of Systems  Constitutional: Positive for malaise/fatigue. Negative for fever.  HENT: Negative for congestion.   Eyes: Negative for blurred vision.  Respiratory: Negative for shortness of breath.   Cardiovascular: Negative for chest pain, palpitations and leg swelling.   Gastrointestinal: Positive for abdominal pain. Negative for blood in stool, constipation, diarrhea and nausea.  Genitourinary: Negative for dysuria and frequency.  Musculoskeletal: Negative for falls.  Skin: Negative for rash.  Neurological: Negative for dizziness, loss of consciousness and headaches.  Endo/Heme/Allergies: Negative for environmental allergies.  Psychiatric/Behavioral: Negative for depression. The patient is not nervous/anxious.        Objective:    Physical Exam  There were no vitals taken for this visit. Wt Readings from Last 3 Encounters:  05/20/19 126 lb 6.4 oz (57.3 kg)  03/19/18 132 lb (59.9 kg)  03/19/18 132 lb (59.9 kg)  Diabetic Foot Exam - Simple   No data filed    Lab Results  Component Value Date   WBC 5.3 07/01/2019   HGB 13.8 07/01/2019   HCT 41.1 07/01/2019   PLT 194.0 07/01/2019   GLUCOSE 91 07/01/2019   CHOL 207 (H) 10/10/2018   TRIG 78.0 10/10/2018   HDL 94.60 10/10/2018   LDLDIRECT 94.8 07/28/2010   LDLCALC 97 10/10/2018   ALT 14 07/01/2019   AST 18 07/01/2019   NA 139 07/01/2019   K 4.9 07/01/2019   CL 101 07/01/2019   CREATININE 0.70 07/01/2019   BUN 17 07/01/2019   CO2 31 07/01/2019   TSH 2.00 07/01/2019   HGBA1C 5.5 09/25/2017    Lab Results  Component Value Date   TSH 2.00 07/01/2019   Lab Results  Component Value Date   WBC 5.3 07/01/2019   HGB 13.8 07/01/2019   HCT 41.1 07/01/2019   MCV 101.1 (H) 07/01/2019   PLT 194.0 07/01/2019   Lab Results  Component Value Date   NA 139 07/01/2019   K 4.9 07/01/2019   CO2 31 07/01/2019   GLUCOSE 91 07/01/2019   BUN 17 07/01/2019   CREATININE 0.70 07/01/2019   BILITOT 0.6 07/01/2019   ALKPHOS 159 (H) 07/01/2019   AST 18 07/01/2019   ALT 14 07/01/2019   PROT 6.4 07/01/2019   ALBUMIN 4.3 07/01/2019   CALCIUM 9.7 07/01/2019   GFR 82.30 07/01/2019   Lab Results  Component Value Date   CHOL 207 (H) 10/10/2018   Lab Results  Component Value Date   HDL 94.60  10/10/2018   Lab Results  Component Value Date   LDLCALC 97 10/10/2018   Lab Results  Component Value Date   TRIG 78.0 10/10/2018   Lab Results  Component Value Date   CHOLHDL 2 10/10/2018   Lab Results  Component Value Date   HGBA1C 5.5 09/25/2017       Assessment & Plan:   Problem List Items Addressed This Visit    ESSENTIAL HYPERTENSION, BENIGN    Monitor and report any concerns, no changes to meds. Encouraged heart healthy diet such as the DASH diet and exercise as tolerated.       Relevant Medications   EPINEPHrine (EPIPEN 2-PAK) 0.3 mg/0.3 mL IJ SOAJ injection   Other Relevant Orders   Comprehensive metabolic panel   CBC   TSH   Abnormal findings on diagnostic imaging of lung    Pulmonary nodule seen on CT scan last year. With history of breast cancer and treatments will repeat the CT scan again for surveillance.       Relevant Orders   CT Chest Wo Contrast   Insomnia    Refill given on Ambien to use prn. Encouraged good sleep hygiene such as dark, quiet room. No blue/green glowing lights such as computer screens in bedroom. No alcohol or stimulants in evening. Cut down on caffeine as able. Regular exercise is helpful but not just prior to bed time.       Low back pain - Primary    Worsening, proceed with Xray and possible MRI. Encouraged moist heat and gentle stretching as tolerated. May try NSAIDs and prescription meds as directed and report if symptoms worsen or seek immediate care      Relevant Orders   Lactate Dehydrogenase (LDH)   DG Lumbar Spine 2-3 Views   Elevated alkaline phosphatase level    She will hydrate well prior to next visit and we will continue  to monitor      Disorder of vitamin B12   Hyperlipidemia    Encouraged heart healthy diet, increase exercise, avoid trans fats, consider a krill oil cap daily      Relevant Medications   EPINEPHrine (EPIPEN 2-PAK) 0.3 mg/0.3 mL IJ SOAJ injection   Other Relevant Orders   Lipid panel    Abdominal pain    She notes discomfort and a feeling that there is a suture under the surface where she previously had an abdominal hernia repaired by Dr Damita Lack a surgeon at [facility] many years ago. She has been evaluated by GYN and they report she has not notable pathology. She is going to try and contact her surgeon to discuss getting evaluated.          I have discontinued Chelsee A. Bastarache "Chris"'s EPINEPHrine and EPINEPHrine. I am also having her start on EPINEPHrine. Additionally, I am having her maintain her B-50, glucosamine-chondroitin, Calcium-Vitamin D-Vitamin K (CALCIUM SOFT CHEWS PO), Vitamin D3, vitamin C, Lactobacillus (PROBIOTIC ACIDOPHILUS PO), famotidine, metoprolol succinate, and zolpidem.  Meds ordered this encounter  Medications  . DISCONTD: EPINEPHrine 0.3 mg/0.3 mL IJ SOAJ injection    Sig: INJECT 0.3 MLS (0.3 MG TOTAL) INTO THE MUSCLE ONCE FOR 1 DOSE.    Dispense:  1 each    Refill:  1  . EPINEPHrine (EPIPEN 2-PAK) 0.3 mg/0.3 mL IJ SOAJ injection    Sig: Inject 0.3 mg into the muscle as needed for anaphylaxis.    Dispense:  1 each    Refill:  2  . DISCONTD: zolpidem (AMBIEN) 5 MG tablet    Sig: Take 1 tablet (5 mg total) by mouth at bedtime as needed for sleep.    Dispense:  30 tablet    Refill:  2  . zolpidem (AMBIEN) 5 MG tablet    Sig: Take 1 tablet (5 mg total) by mouth at bedtime as needed for sleep.    Dispense:  30 tablet    Refill:  2    I discussed the assessment and treatment plan with the patient. The patient was provided an opportunity to ask questions and all were answered. The patient agreed with the plan and demonstrated an understanding of the instructions.   The patient was advised to call back or seek an in-person evaluation if the symptoms worsen or if the condition fails to improve as anticipated.  I provided 20 minutes of non-face-to-face time during this encounter.   Penni Homans, MD

## 2020-02-22 NOTE — Assessment & Plan Note (Signed)
Pulmonary nodule seen on CT scan last year. With history of breast cancer and treatments will repeat the CT scan again for surveillance.

## 2020-02-23 ENCOUNTER — Ambulatory Visit: Payer: Medicare Other

## 2020-02-27 ENCOUNTER — Other Ambulatory Visit: Payer: Self-pay

## 2020-02-27 ENCOUNTER — Ambulatory Visit (HOSPITAL_BASED_OUTPATIENT_CLINIC_OR_DEPARTMENT_OTHER)
Admission: RE | Admit: 2020-02-27 | Discharge: 2020-02-27 | Disposition: A | Discharge status: Home / Self Care | Payer: Medicare Other | Source: Ambulatory Visit | Attending: Family Medicine | Admitting: Family Medicine

## 2020-02-27 ENCOUNTER — Ambulatory Visit (HOSPITAL_BASED_OUTPATIENT_CLINIC_OR_DEPARTMENT_OTHER)
Admission: RE | Admit: 2020-02-27 | Discharge: 2020-02-27 | Disposition: A | Discharge status: Home / Self Care | Payer: Medicare Other | Source: Ambulatory Visit | Attending: Radiology | Admitting: Radiology

## 2020-02-27 DIAGNOSIS — R1032 Left lower quadrant pain: Secondary | ICD-10-CM | POA: Diagnosis not present

## 2020-02-27 DIAGNOSIS — M545 Low back pain, unspecified: Secondary | ICD-10-CM | POA: Diagnosis not present

## 2020-02-27 DIAGNOSIS — M5136 Other intervertebral disc degeneration, lumbar region: Secondary | ICD-10-CM | POA: Diagnosis not present

## 2020-02-27 DIAGNOSIS — R918 Other nonspecific abnormal finding of lung field: Secondary | ICD-10-CM | POA: Diagnosis not present

## 2020-02-27 DIAGNOSIS — R911 Solitary pulmonary nodule: Secondary | ICD-10-CM | POA: Diagnosis not present

## 2020-02-27 DIAGNOSIS — N858 Other specified noninflammatory disorders of uterus: Secondary | ICD-10-CM | POA: Diagnosis not present

## 2020-02-28 ENCOUNTER — Encounter: Payer: Self-pay | Admitting: Family Medicine

## 2020-02-29 ENCOUNTER — Other Ambulatory Visit: Payer: Self-pay | Admitting: Family Medicine

## 2020-02-29 DIAGNOSIS — R911 Solitary pulmonary nodule: Secondary | ICD-10-CM

## 2020-03-03 ENCOUNTER — Emergency Department (HOSPITAL_BASED_OUTPATIENT_CLINIC_OR_DEPARTMENT_OTHER): Payer: Medicare Other

## 2020-03-03 ENCOUNTER — Other Ambulatory Visit: Payer: Self-pay

## 2020-03-03 ENCOUNTER — Emergency Department (HOSPITAL_BASED_OUTPATIENT_CLINIC_OR_DEPARTMENT_OTHER)
Admission: EM | Admit: 2020-03-03 | Discharge: 2020-03-03 | Disposition: A | Discharge status: Home / Self Care | Payer: Medicare Other | Attending: Emergency Medicine | Admitting: Emergency Medicine

## 2020-03-03 ENCOUNTER — Encounter (HOSPITAL_BASED_OUTPATIENT_CLINIC_OR_DEPARTMENT_OTHER): Payer: Self-pay

## 2020-03-03 ENCOUNTER — Encounter: Payer: Self-pay | Admitting: Family Medicine

## 2020-03-03 DIAGNOSIS — Z20822 Contact with and (suspected) exposure to covid-19: Secondary | ICD-10-CM | POA: Diagnosis not present

## 2020-03-03 DIAGNOSIS — Z853 Personal history of malignant neoplasm of breast: Secondary | ICD-10-CM | POA: Diagnosis not present

## 2020-03-03 DIAGNOSIS — R918 Other nonspecific abnormal finding of lung field: Secondary | ICD-10-CM | POA: Diagnosis not present

## 2020-03-03 DIAGNOSIS — I10 Essential (primary) hypertension: Secondary | ICD-10-CM | POA: Diagnosis not present

## 2020-03-03 DIAGNOSIS — Z79899 Other long term (current) drug therapy: Secondary | ICD-10-CM | POA: Diagnosis not present

## 2020-03-03 DIAGNOSIS — J181 Lobar pneumonia, unspecified organism: Secondary | ICD-10-CM | POA: Diagnosis not present

## 2020-03-03 DIAGNOSIS — I700 Atherosclerosis of aorta: Secondary | ICD-10-CM | POA: Diagnosis not present

## 2020-03-03 DIAGNOSIS — Z87891 Personal history of nicotine dependence: Secondary | ICD-10-CM | POA: Insufficient documentation

## 2020-03-03 DIAGNOSIS — J189 Pneumonia, unspecified organism: Secondary | ICD-10-CM

## 2020-03-03 DIAGNOSIS — R059 Cough, unspecified: Secondary | ICD-10-CM | POA: Diagnosis present

## 2020-03-03 DIAGNOSIS — M545 Low back pain, unspecified: Secondary | ICD-10-CM | POA: Diagnosis not present

## 2020-03-03 DIAGNOSIS — R911 Solitary pulmonary nodule: Secondary | ICD-10-CM | POA: Diagnosis not present

## 2020-03-03 DIAGNOSIS — Z85828 Personal history of other malignant neoplasm of skin: Secondary | ICD-10-CM | POA: Insufficient documentation

## 2020-03-03 DIAGNOSIS — R0789 Other chest pain: Secondary | ICD-10-CM | POA: Insufficient documentation

## 2020-03-03 DIAGNOSIS — I2699 Other pulmonary embolism without acute cor pulmonale: Secondary | ICD-10-CM | POA: Diagnosis not present

## 2020-03-03 LAB — CBC WITH DIFFERENTIAL/PLATELET
Abs Immature Granulocytes: 0.04 10*3/uL (ref 0.00–0.07)
Basophils Absolute: 0 10*3/uL (ref 0.0–0.1)
Basophils Relative: 0 %
Eosinophils Absolute: 0 10*3/uL (ref 0.0–0.5)
Eosinophils Relative: 0 %
HCT: 41 % (ref 36.0–46.0)
Hemoglobin: 14.3 g/dL (ref 12.0–15.0)
Immature Granulocytes: 0 %
Lymphocytes Relative: 8 %
Lymphs Abs: 0.9 10*3/uL (ref 0.7–4.0)
MCH: 34.9 pg — ABNORMAL HIGH (ref 26.0–34.0)
MCHC: 34.9 g/dL (ref 30.0–36.0)
MCV: 100 fL (ref 80.0–100.0)
Monocytes Absolute: 0.4 10*3/uL (ref 0.1–1.0)
Monocytes Relative: 4 %
Neutro Abs: 9.6 10*3/uL — ABNORMAL HIGH (ref 1.7–7.7)
Neutrophils Relative %: 88 %
Platelets: 134 10*3/uL — ABNORMAL LOW (ref 150–400)
RBC: 4.1 MIL/uL (ref 3.87–5.11)
RDW: 12.6 % (ref 11.5–15.5)
WBC: 10.9 10*3/uL — ABNORMAL HIGH (ref 4.0–10.5)
nRBC: 0 % (ref 0.0–0.2)

## 2020-03-03 LAB — BASIC METABOLIC PANEL
Anion gap: 13 (ref 5–15)
BUN: 17 mg/dL (ref 8–23)
CO2: 22 mmol/L (ref 22–32)
Calcium: 8.9 mg/dL (ref 8.9–10.3)
Chloride: 100 mmol/L (ref 98–111)
Creatinine, Ser: 0.77 mg/dL (ref 0.44–1.00)
GFR, Estimated: >60 mL/min (ref >60)
Glucose, Bld: 130 mg/dL — ABNORMAL HIGH (ref 70–99)
Potassium: 3.7 mmol/L (ref 3.5–5.1)
Sodium: 135 mmol/L (ref 135–145)

## 2020-03-03 LAB — D-DIMER, QUANTITATIVE: D-Dimer, Quant: 0.99 ug/mL-FEU — ABNORMAL HIGH (ref 0.00–0.50)

## 2020-03-03 MED ORDER — DOXYCYCLINE HYCLATE 100 MG PO CAPS: 100.0000 mg | ORAL_CAPSULE | Freq: Two times a day (BID) | ORAL | 0 refills | Status: AC

## 2020-03-03 MED ORDER — IOHEXOL 350 MG/ML SOLN
100.0000 mL | Freq: Once | INTRAVENOUS | Status: AC | PRN
  Administered 2020-03-03: 100 mL via INTRAVENOUS

## 2020-03-03 MED ORDER — TRAMADOL HCL 50 MG PO TABS: 50.0000 mg | ORAL_TABLET | Freq: Two times a day (BID) | ORAL | 0 refills | Status: DC | PRN

## 2020-03-03 NOTE — Telephone Encounter (Signed)
Spoke w/ Pt- she sounded very uncomfortable on the phone. States its hard for her to catch her breath sitting or standing, pain started in mid back this morning, it is pretty constant. Her at home covid test was negative but she just started with symptoms yesterday. Informed that she could have tested to early- I recommended she go on to ED for evaluation. Pt agreed, she will have her husband drive her to the ER at [facility].

## 2020-03-03 NOTE — ED Triage Notes (Addendum)
Pt c/o right mid back pain x today "symptoms of covid" started last night with +covid exposure-pt denies CP-pain worse with movement-denies injury-grimacing with movement

## 2020-03-03 NOTE — ED Notes (Addendum)
Ambulating pulse ox 91%

## 2020-03-03 NOTE — ED Provider Notes (Signed)
[facility] Provider Note   CSN: [id number] Arrival date & time: 03/03/20  1605     History Chief Complaint  Patient presents with  . Back Pain    Carrie [name] is a 75 y.o. female with PMH/o low back pain, breast cancer (2012), pulmonary nodules presents for evaluation of cough, generalized body aches, fever/chills that began last night.  She also reports some pain in the right mid back.  She states that she felt like she was having trouble breathing and because of the pain, she felt like she cannot take a deep breath in.  She states that she had a fever up to 103 last night.  She has not measured a fever today.  She took an at-home Covid test today and it was negative.  She called her primary care doctor and they recommended she get evaluated in the ED.  She has been exposed to Covid.  She reports her daughter had Covid 2 weeks ago and she was helping take care of the kids.  She has been vaccinated x3.  She denies any cough, chest pain, abdominal pain, nausea/vomiting, dysuria, hematuria.  She denies any OCP use, recent immobilization, prior history of DVT/PE, recent surgery, leg swelling, or long travel.  HPI     Past Medical History:  Diagnosis Date  . Breast cancer ([place]) 08/2004   Left breast invasive ductal carcinoma  . CHICKENPOX, HX OF 03/03/2010  . COMMON MIGRAINE 03/03/2010  . DEGENERATIVE DISC DISEASE 03/03/2010  . Essential hypertension, benign 03/03/2010  . Foot pain, bilateral 02/08/2016  . HYPOKALEMIA 03/03/2010  . Increased vitamin B12 level 01/10/2015  . Insomnia 08/01/2016  . Low back pain 08/01/2016  . MEASLES, HX OF 03/03/2010  . Melanoma of skin, site unspecified 03/03/2010  . 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 NEOPLASM, MALIGNANT, BREAST, HX OF 03/03/2010  . OSTEOPENIA 03/03/2010  . PALPITATIONS, HX OF 03/03/2010  . Pulmonary nodules 01/07/2015  . Rib fracture    left, 3 fractures s/p radiation: right side 2 fractured, no surgery     Patient Active Problem List   Diagnosis Date Noted  . Abdominal pain 02/22/2020  . Hyperlipidemia 10/15/2018  . Diverticulitis 09/30/2017  . Disorder of vitamin B12 09/24/2017  . Urticaria 07/21/2017  . Anxiety 07/19/2017  . Elevated alkaline phosphatase level 01/24/2017  . Insomnia 08/01/2016  . Low back pain 08/01/2016  . Foot pain, bilateral 02/08/2016  . Squamous cell carcinoma 01/21/2016  . Pain in joint, ankle and foot 01/21/2016  . Abnormal findings on diagnostic imaging of lung 01/07/2015  . PVC's (premature ventricular contractions) 01/20/2014  . Spider veins 02/26/2013  . GERD (gastroesophageal reflux disease) 01/08/2012  . Preventative health care 07/28/2010  . MUMPS ENCEPHALITIS 03/03/2010  . Melanoma of skin ([place]) 03/03/2010  . HYPOKALEMIA 03/03/2010  . Migraine without aura 03/03/2010  . ESSENTIAL HYPERTENSION, BENIGN 03/03/2010  . DEGENERATIVE DISC DISEASE 03/03/2010  . Disorder of bone and cartilage 03/03/2010  . NEOPLASM, MALIGNANT, BREAST, HX OF 03/03/2010  . MEASLES, HX OF 03/03/2010  . History of cardiovascular disorder 03/03/2010  . CHICKENPOX, HX OF 03/03/2010    Past Surgical History:  Procedure Laterality Date  . BREAST LUMPECTOMY WITH NEEDLE LOCALIZATION AND AXILLARY SENTINEL LYMPH NODE BX Left 08/2004  . clavicle dislocate  75 yrs old   right, reapproximated w/pins, subsequently removed  . HERNIA REPAIR  05/2012  . left hip traumatic history     hematoma surgically evacuated  . SHOULDER ARTHROSCOPY  b/l shoulder with debridement  . TONSILLECTOMY       OB History   No obstetric history on file.     Family History  Problem Relation Age of Onset  . Hypertension Mother   . COPD Mother   . Alcohol abuse Father        history of  . Coronary artery disease Father   . Osteoarthritis Brother        knees  . Basal cell carcinoma Sister   . Heart disease Maternal Grandmother   . Stroke Maternal Grandfather   . Osteoarthritis Sister         knees  . Migraines Sister     Social History   Tobacco Use  . Smoking status: Former Smoker    Packs/day: 0.25    Quit date: 02/07/1968    Years since quitting: 52.1  . Smokeless tobacco: Never Used  Vaping Use  . Vaping Use: Never used  Substance Use Topics  . Alcohol use: Yes    Comment: weekly  . Drug use: No    Home Medications Prior to Admission medications   Medication Sig Start Date End Date Taking? Authorizing Provider  doxycycline (VIBRAMYCIN) 100 MG capsule Take 1 capsule (100 mg total) by mouth 2 (two) times daily for 7 days. 03/03/20 03/10/20 Yes Volanda Napoleon, PA-C  traMADol (ULTRAM) 50 MG tablet Take 1 tablet (50 mg total) by mouth every 12 (twelve) hours as needed. 03/03/20  Yes Volanda Napoleon, PA-C  Ascorbic Acid (VITAMIN C) 1000 MG tablet Take 1,000 mg by mouth daily.    [provider]  Calcium-Vitamin D-Vitamin K (CALCIUM SOFT CHEWS PO) Take 1 tablet by mouth 2 (two) times daily.    [provider]  Cholecalciferol (VITAMIN D3) 1000 UNITS CAPS Take 1 capsule by mouth daily.    [provider]  EPINEPHrine (EPIPEN 2-PAK) 0.3 mg/0.3 mL IJ SOAJ injection Inject 0.3 mg into the muscle as needed for anaphylaxis. 02/19/20   [name], MD  famotidine (PEPCID) 40 MG tablet Take by mouth. 02/04/19   [provider]  glucosamine-chondroitin 500-400 MG tablet Take 1 tablet by mouth daily.    [provider]  Lactobacillus (PROBIOTIC ACIDOPHILUS PO) Take by mouth.    [provider]  metoprolol succinate (TOPROL-XL) 50 MG 24 hr tablet TAKE 1 TABLET (50 MG TOTAL) BY MOUTH DAILY. TAKE WITH OR IMMEDIATELY FOLLOWING A MEAL. 12/11/19   [name], MD  Vitamins-Lipotropics (B-50) TABS Take 1 tablet by mouth daily.    [provider]  zolpidem (AMBIEN) 5 MG tablet Take 1 tablet (5 mg total) by mouth at bedtime as needed for sleep. 02/19/20   [name], MD    Allergies     Hydrocodone-acetaminophen, Codeine, Demerol, and Meperidine hcl  Review of Systems   Review of Systems  Constitutional: Positive for fever.  Respiratory: Positive for shortness of breath. Negative for cough.   Cardiovascular: Negative for chest pain.  Gastrointestinal: Negative for abdominal pain, nausea and vomiting.  Genitourinary: Negative for dysuria and hematuria.  Musculoskeletal: Positive for back pain and myalgias.  Neurological: Negative for headaches.  All other systems reviewed and are negative.   Physical Exam Updated Vital Signs BP 114/70   Pulse 78   Temp 98.6 F (37 C) (Oral)   Resp 16   Ht 5\' 5"  (1.651 m)   Wt 57.2 kg   SpO2 95%   BMI 20.97 kg/m   Physical Exam Vitals  and nursing note reviewed.  Constitutional:      Appearance: Normal appearance. She is well-developed and well-nourished.  HENT:     Head: Normocephalic and atraumatic.     Mouth/Throat:     Mouth: Oropharynx is clear and moist and mucous membranes are normal.  Eyes:     General: Lids are normal.     Extraocular Movements: EOM normal.     Conjunctiva/sclera: Conjunctivae normal.     Pupils: Pupils are equal, round, and reactive to light.  Cardiovascular:     Rate and Rhythm: Normal rate and regular rhythm.     Pulses: Normal pulses.     Heart sounds: Normal heart sounds. No murmur heard. No friction rub. No gallop.   Pulmonary:     Effort: Pulmonary effort is normal.     Breath sounds: Normal breath sounds.     Comments: Lungs clear to auscultation bilaterally.  Symmetric chest rise.  No wheezing, rales, rhonchi. Abdominal:     Palpations: Abdomen is soft. Abdomen is not rigid.     Tenderness: There is no abdominal tenderness. There is no guarding.     Comments: Abdomen is soft, non-distended, non-tender. No rigidity, No guarding. No peritoneal signs.  Musculoskeletal:        General: Normal range of motion.     Cervical back: Full passive range of motion without pain.  Skin:     General: Skin is warm and dry.     Capillary Refill: Capillary refill takes less than 2 seconds.  Neurological:     Mental Status: She is alert and oriented to person, place, and time.  Psychiatric:        Mood and Affect: Mood and affect normal.        Speech: Speech normal.     ED Results / Procedures / Treatments   Labs (all labs ordered are listed, but only abnormal results are displayed) Labs Reviewed  BASIC METABOLIC PANEL - Abnormal; Notable for the following components:      Result Value   Glucose, Bld 130 (*)    All other components within normal limits  CBC WITH DIFFERENTIAL/PLATELET - Abnormal; Notable for the following components:   WBC 10.9 (*)    MCH 34.9 (*)    Platelets 134 (*)    Neutro Abs 9.6 (*)    All other components within normal limits  D-DIMER, QUANTITATIVE (NOT AT [facility]) - Abnormal; Notable for the following components:   D-Dimer, Quant 0.99 (*)    All other components within normal limits  SARS CORONAVIRUS 2 (TAT 6-24 HRS)    EKG None  Radiology CT Angio Chest PE W and/or Wo Contrast  Result Date: 03/03/2020 CLINICAL DATA:  Concern for pulmonary embolism. EXAM: CT ANGIOGRAPHY CHEST WITH CONTRAST TECHNIQUE: Multidetector CT imaging of the chest was performed using the standard protocol during bolus administration of intravenous contrast. Multiplanar CT image reconstructions and MIPs were obtained to evaluate the vascular anatomy. CONTRAST:  129mL OMNIPAQUE IOHEXOL 350 MG/ML SOLN COMPARISON:  Chest CT dated 02/27/2020. FINDINGS: Cardiovascular: There is no cardiomegaly or pericardial effusion. Mild atherosclerotic calcification of the thoracic aorta. No aneurysmal dilatation or dissection. The origins of the great vessels of the aortic arch appear patent. No pulmonary artery embolus identified. Mediastinum/Nodes: No hilar or mediastinal adenopathy. The esophagus is grossly unremarkable. No mediastinal fluid collection. Lungs/Pleura: There has been interval  development of an area of consolidative change involving the right lower lobe, new since the prior CT most consistent with  pneumonia. Clinical correlation and follow-up to resolution recommended. There is no pleural effusion or pneumothorax. The central airways are patent. There is 6 mm pulmonary nodule at the right lung base (66/5). Additional nodules seen on the prior CT in the right lower lobe are obscured by consolidative lung. Follow-up as per recommendation of the prior CT. Upper Abdomen: No acute abnormality. Musculoskeletal: No chest wall abnormality. No acute or significant osseous findings. Review of the MIP images confirms the above findings. IMPRESSION: 1. No CT evidence of pulmonary embolism. 2. Interval development of right lower lobe consolidative change most consistent with pneumonia. Clinical correlation and follow-up to resolution recommended. 3. A 6 mm nodule at the right lung base. Follow-up as per recommendation of prior CT. 4. Aortic Atherosclerosis (ICD10-I70.0). Electronically Signed   By: Anner Crete M.D.   On: 03/03/2020 21:28   DG Chest Portable 1 View  Result Date: 03/03/2020 CLINICAL DATA:  Back pain EXAM: PORTABLE CHEST 1 VIEW COMPARISON:  05/09/2005, CT 02/27/2020 FINDINGS: Chronic left fourth anterior rib fracture. New airspace disease in the right lower lung suspicious for pneumonia. Normal heart size. No pneumothorax IMPRESSION: New airspace disease in the right lower lung suspicious for pneumonia. Electronically Signed   By: Donavan Foil M.D.   On: 03/03/2020 17:08    Procedures Procedures   Medications Ordered in ED Medications  iohexol (OMNIPAQUE) 350 MG/ML injection 100 mL (100 mLs Intravenous Contrast Given 03/03/20 2112)    ED Course  I have reviewed the triage vital signs and the nursing notes.  Pertinent labs & imaging results that were available during my care of the patient were reviewed by me and considered in my medical decision making (see chart  for details).    MDM Rules/Calculators/A&P                          75 year old female who presents for evaluation of fevers, chills, myalgias, shortness of breath began last night.  She took an at-home Covid test was negative.  She called her PCP and they advised her to come to emergency department.  She states she is having pain in the right mid back and states that it hurts whenever she takes a deep breath in and because of the pain, she feels like she is short of breath.  She has had back issues before but she states that this does not feel like her normal low back issues.  She called her PCP and they advised her to come to the emergency department for further evaluation.  On initial arrival, she is afebrile nontoxic-appearing.  Vital signs are stable.  No tachycardia, hypoxia.  No tenderness palpation of the paraspinal muscles of the back.  She does report that there is a pleuritic component to this pain.  She has no PE risk factors but given that she has pleuritic chest pain and cannot be perked out, will plan for D-dimer.  Also consider COVID-19 versus infectious etiology.  Chest x-ray shows right lower lung pneumonia.  BMP shows normal BUN and creatinine.  CBC shows leukocytosis of 10.9.  D-dimer is elevated at 0.99.  Given that her D-dimer is elevated, will obtain a CTA of chest to rule out PE.  CTA of chest shows no evidence of PE.  She has right lower lobe pneumonia.  There is a 6 mm pulmonary nodule noted around her right lung base which patient already knew about.  Patient ambulated in the ED with maintaining O2  sats of 91.  She has been between 95-96 during her ED course.  Patient hemodynamically stable at this time.  I discussed results with patient.  She has a Covid test pending.  I discussed with her that if the Covid test comes back positive, she can follow-up with the Covid treatment center which she has been referred to.  If she is negative, encourage her to take the antibiotics  for treatment of pneumonia.  Patient requesting something for the pain that she is experiencing.  She has allergy to hydrocodone.  She has never had any tramadol.  We will start with a low-dose and see if she has any improvement.  Instructed patient to follow-up with her primary care doctor as well. At this time, patient exhibits no emergent life-threatening condition that require further evaluation in ED. Patient had ample opportunity for questions and discussion. All patient's questions were answered with full understanding. Strict return precautions discussed. Patient expresses understanding and agreement to plan.   QUASHA SPEIDEL was evaluated in Emergency Department on 03/03/2020 for the symptoms described in the history of present illness. She was evaluated in the context of the global COVID-19 pandemic, which necessitated consideration that the patient might be at risk for infection with the SARS-CoV-2 virus that causes COVID-19. Institutional protocols and algorithms that pertain to the evaluation of patients at risk for COVID-19 are in a state of rapid change based on information released by regulatory bodies including the CDC and federal and state organizations. These policies and algorithms were followed during the patient's care in the ED.  Portions of this note were generated with [employer]. Dictation errors may occur despite best attempts at proofreading.    Final Clinical Impression(s) / ED Diagnoses Final diagnoses:  Community acquired pneumonia of right lung, unspecified part of lung  Encounter for laboratory testing for COVID-19 virus    Rx / DC Orders ED Discharge Orders         Ordered    doxycycline (VIBRAMYCIN) 100 MG capsule  2 times daily        03/03/20 2300    traMADol (ULTRAM) 50 MG tablet  Every 12 hours PRN        03/03/20 2300    Ambulatory referral for Covid Treatment        03/03/20 2300           Desma Mcgregor 03/03/20 2303     Lucrezia Starch, MD 03/06/20 0001

## 2020-03-03 NOTE — Discharge Instructions (Signed)
As we discussed, today your imaging showed evidence of pneumonia in the right lower lung.  This is most likely causing the irritation.  As we discussed, you have a Covid test pending.  You can check online regarding your results.  If you are positive, you do not need to start the antibiotics.  If you are negative, start the antibiotics.  You can take Tylenol or Ibuprofen as directed for pain. You can alternate Tylenol and Ibuprofen every 4 hours. If you take Tylenol at 1pm, then you can take Ibuprofen at 5pm. Then you can take Tylenol again at 9pm.   Take pain medications as directed for break through pain. Do not drive or operate machinery while taking this medication.  If the pain medication makes you feel drowsy, you have bad reaction to it, stop taking it.  If your Covid positive, I have referred you to the ambulatory Covid treatment center who can follow-up regarding any further treatment that you might be eligible for.  As we discussed, closely monitor symptoms.  If you have any difficulty breathing, persistent vomiting, chest pain, any other worsening concerning symptoms, return the emergency department.

## 2020-03-03 NOTE — ED Notes (Signed)
Pt is c/o covid symptoms that started last night, chills, headache, fever, and funny taste  States she took a covid home test this morning that was negative

## 2020-03-04 ENCOUNTER — Telehealth: Payer: Self-pay | Admitting: Nurse Practitioner

## 2020-03-04 LAB — SARS CORONAVIRUS 2 (TAT 6-24 HRS): SARS Coronavirus 2: NEGATIVE

## 2020-03-04 NOTE — Telephone Encounter (Signed)
I called [name] to discuss possible Covid symptoms and the use of Sotrovimab, a monoclonal antibody infusion for those with mild to moderate Covid symptoms and at a high risk of hospitalization.  Pt was referred to infusion clinic by ED provider after CT chest showed RLL PNA w/ known COVID exposure ~ 2 wks ago and symptom onset on 1/25, however, COVID19 swab has returned negative.  Pt rx doxycycline in ED.  She is feeling much better this AM.  We discussed the possibility that neg test could be a result of testing too soon and that repeat testing may be warranted in 1-2 days, especially if Ss do not continue to improve.  Pt verbalized understanding and was grateful for callback.  Murray Hodgkins, NP

## 2020-03-05 DIAGNOSIS — Z20822 Contact with and (suspected) exposure to covid-19: Secondary | ICD-10-CM | POA: Diagnosis not present

## 2020-03-07 DIAGNOSIS — L271 Localized skin eruption due to drugs and medicaments taken internally: Secondary | ICD-10-CM | POA: Diagnosis not present

## 2020-03-08 ENCOUNTER — Encounter: Payer: Self-pay | Admitting: Family Medicine

## 2020-03-08 ENCOUNTER — Other Ambulatory Visit: Payer: Medicare Other

## 2020-03-10 ENCOUNTER — Telehealth: Payer: Self-pay | Admitting: Nurse Practitioner

## 2020-03-10 NOTE — Telephone Encounter (Signed)
[facility] called pt for HFU. Pt reports feeling much better & almost finished antibiotic. Pt reports f/u with PCP.

## 2020-03-15 ENCOUNTER — Other Ambulatory Visit (INDEPENDENT_AMBULATORY_CARE_PROVIDER_SITE_OTHER): Payer: Medicare Other

## 2020-03-15 ENCOUNTER — Other Ambulatory Visit: Payer: Self-pay

## 2020-03-15 DIAGNOSIS — M545 Low back pain, unspecified: Secondary | ICD-10-CM

## 2020-03-15 DIAGNOSIS — I10 Essential (primary) hypertension: Secondary | ICD-10-CM | POA: Diagnosis not present

## 2020-03-15 DIAGNOSIS — E785 Hyperlipidemia, unspecified: Secondary | ICD-10-CM

## 2020-03-15 LAB — CBC
HCT: 39.6 % (ref 36.0–46.0)
Hemoglobin: 13.7 g/dL (ref 12.0–15.0)
MCHC: 34.5 g/dL (ref 30.0–36.0)
MCV: 100.9 fl — ABNORMAL HIGH (ref 78.0–100.0)
Platelets: 298 10*3/uL (ref 150.0–400.0)
RBC: 3.92 Mil/uL (ref 3.87–5.11)
RDW: 12.9 % (ref 11.5–15.5)
WBC: 4.5 10*3/uL (ref 4.0–10.5)

## 2020-03-15 LAB — COMPREHENSIVE METABOLIC PANEL
ALT: 9 U/L (ref 0–35)
AST: 14 U/L (ref 0–37)
Albumin: 3.9 g/dL (ref 3.5–5.2)
Alkaline Phosphatase: 108 U/L (ref 39–117)
BUN: 15 mg/dL (ref 6–23)
CO2: 31 mEq/L (ref 19–32)
Calcium: 9 mg/dL (ref 8.4–10.5)
Chloride: 103 mEq/L (ref 96–112)
Creatinine, Ser: 0.66 mg/dL (ref 0.40–1.20)
GFR: 87.58 mL/min (ref >60.00)
Glucose, Bld: 87 mg/dL (ref 70–99)
Potassium: 3.8 mEq/L (ref 3.5–5.1)
Sodium: 139 mEq/L (ref 135–145)
Total Bilirubin: 0.6 mg/dL (ref 0.2–1.2)
Total Protein: 6.2 g/dL (ref 6.0–8.3)

## 2020-03-15 LAB — LIPID PANEL
Cholesterol: 189 mg/dL (ref 0–200)
HDL: 69.3 mg/dL (ref >39.00)
LDL Cholesterol: 99 mg/dL (ref 0–99)
NonHDL: 119.34
Total CHOL/HDL Ratio: 3
Triglycerides: 103 mg/dL (ref 0.0–149.0)
VLDL: 20.6 mg/dL (ref 0.0–40.0)

## 2020-03-15 LAB — TSH: TSH: 2.72 u[IU]/mL (ref 0.35–4.50)

## 2020-03-16 LAB — LACTATE DEHYDROGENASE: LDH: 109 U/L — ABNORMAL LOW (ref 120–250)

## 2020-03-31 ENCOUNTER — Other Ambulatory Visit: Payer: Self-pay | Admitting: Family Medicine

## 2020-05-11 ENCOUNTER — Encounter: Payer: Self-pay | Admitting: Family Medicine

## 2020-05-11 ENCOUNTER — Telehealth (INDEPENDENT_AMBULATORY_CARE_PROVIDER_SITE_OTHER): Payer: Medicare Other | Admitting: Internal Medicine

## 2020-05-11 ENCOUNTER — Encounter: Payer: Self-pay | Admitting: Internal Medicine

## 2020-05-11 VITALS — BP 140/89 | HR 53 | Temp 97.9°F | Wt 126.0 lb

## 2020-05-11 DIAGNOSIS — J019 Acute sinusitis, unspecified: Secondary | ICD-10-CM

## 2020-05-11 MED ORDER — AMOXICILLIN 875 MG PO TABS: 875.0000 mg | ORAL_TABLET | Freq: Two times a day (BID) | ORAL | 0 refills | Status: DC

## 2020-05-11 NOTE — Progress Notes (Signed)
Subjective:    Patient ID: Carrie [name], female    DOB: [birth year], 75 y.o.   MRN: [id number]  DOS:  05/11/2020 Type of visit - description: Virtual Visit via Video Note  I connected with the above patient  by a video enabled telemedicine application and verified that I am speaking with the correct person using two identifiers.   THIS ENCOUNTER IS A VIRTUAL VISIT DUE TO COVID-19 - PATIENT WAS NOT SEEN IN THE OFFICE. PATIENT HAS CONSENTED TO VIRTUAL VISIT / TELEMEDICINE VISIT   Location of patient: home  Location of provider: office  Persons participating in the virtual visit: patient, provider   I discussed the limitations of evaluation and management by telemedicine and the availability of in person appointments. The patient expressed understanding and agreed to proceed.  Acute Symptoms a started 3 days ago: Bilateral facial/sinus pressure, worse when she bends over. Symptoms are gradually slightly increased. Initially she thought this could be a migraine, took migraine Excedrin did not help Took Sudafed x1 did not help.  Denies fever chills No  sneezing, no itchy eyes or nose. Denies cough or chest congestion Has some mild postnasal drip.    Review of Systems See above   Past Medical History:  Diagnosis Date  . Breast cancer ([place]) 08/2004   Left breast invasive ductal carcinoma  . CHICKENPOX, HX OF 03/03/2010  . COMMON MIGRAINE 03/03/2010  . DEGENERATIVE DISC DISEASE 03/03/2010  . Essential hypertension, benign 03/03/2010  . Foot pain, bilateral 02/08/2016  . HYPOKALEMIA 03/03/2010  . Increased vitamin B12 level 01/10/2015  . Insomnia 08/01/2016  . Low back pain 08/01/2016  . MEASLES, HX OF 03/03/2010  . Melanoma of skin, site unspecified 03/03/2010  . 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 NEOPLASM, MALIGNANT, BREAST, HX OF 03/03/2010  . OSTEOPENIA 03/03/2010  . PALPITATIONS, HX OF 03/03/2010  . Pulmonary nodules 01/07/2015  . Rib fracture    left, 3 fractures s/p radiation:  right side 2 fractured, no surgery    Past Surgical History:  Procedure Laterality Date  . BREAST LUMPECTOMY WITH NEEDLE LOCALIZATION AND AXILLARY SENTINEL LYMPH NODE BX Left 08/2004  . clavicle dislocate  75 yrs old   right, reapproximated w/pins, subsequently removed  . HERNIA REPAIR  05/2012  . left hip traumatic history     hematoma surgically evacuated  . SHOULDER ARTHROSCOPY     b/l shoulder with debridement  . TONSILLECTOMY      Allergies as of 05/11/2020      Reactions   Hydrocodone-acetaminophen Other (See Comments)   Slows Respiration Rate Down   Codeine    REACTION: upsets stomach   Demerol    hives   Doxycycline Hives   Meperidine Hcl    REACTION: Hives      Medication List       Accurate as of May 11, 2020  9:36 AM. If you have any questions, ask your nurse or doctor.        B-50 Tabs Take 1 tablet by mouth daily.   CALCIUM SOFT CHEWS PO Take 1 tablet by mouth 2 (two) times daily.   EPINEPHrine 0.3 mg/0.3 mL Soaj injection Commonly known as: EpiPen 2-Pak Inject 0.3 mg into the muscle as needed for anaphylaxis.   famotidine 40 MG tablet Commonly known as: PEPCID Take by mouth.   glucosamine-chondroitin 500-400 MG tablet Take 1 tablet by mouth daily.   metoprolol succinate 50 MG 24 hr tablet Commonly known as: TOPROL-XL TAKE 1 TABLET (50 MG TOTAL)  BY MOUTH DAILY. TAKE WITH OR IMMEDIATELY FOLLOWING A MEAL.   PROBIOTIC ACIDOPHILUS PO Take by mouth.   traMADol 50 MG tablet Commonly known as: ULTRAM Take 1 tablet (50 mg total) by mouth every 12 (twelve) hours as needed.   vitamin C 1000 MG tablet Take 1,000 mg by mouth daily.   Vitamin D3 25 MCG (1000 UT) Caps Take 1 capsule by mouth daily.   zolpidem 5 MG tablet Commonly known as: AMBIEN Take 1 tablet (5 mg total) by mouth at bedtime as needed for sleep.          Objective:   Physical Exam BP 140/89 (BP Location: Right Arm, Patient Position: Sitting, Cuff Size: Large)   Pulse (!)  53   Temp 97.9 F (36.6 C) (Temporal)   Wt 126 lb (57.2 kg)   BMI 20.97 kg/m  This is a virtual video visit, she looks and sounded well.  Speaking in complete sentences, in no distress.  Initial BP was 140/89, she just recheck it, 123/76.    Assessment      75 year old female, PMH includes HTN, migraines, PVCs, GERD, presents with:  Acute sinusitis: Viral versus bacterial, less likely allergic. She is triple vaccinated against COVID. In no distress. Plan: Check Covid, call if test is + Flonase, okay to use Sudafed as long as BP remains okay.  Mucinex, drink plenty fluids. If no better in the next few days she will start amoxicillin, Rx sent. Message with detailed instructions sent      I discussed the assessment and treatment plan with the patient. The patient was provided an opportunity to ask questions and all were answered. The patient agreed with the plan and demonstrated an understanding of the instructions.   The patient was advised to call back or seek an in-person evaluation if the symptoms worsen or if the condition fails to improve as anticipated.

## 2020-05-20 ENCOUNTER — Other Ambulatory Visit: Payer: Self-pay | Admitting: General Surgery

## 2020-05-20 DIAGNOSIS — R1032 Left lower quadrant pain: Secondary | ICD-10-CM | POA: Diagnosis not present

## 2020-05-25 ENCOUNTER — Telehealth: Payer: Medicare Other | Admitting: Internal Medicine

## 2020-05-28 ENCOUNTER — Other Ambulatory Visit (HOSPITAL_BASED_OUTPATIENT_CLINIC_OR_DEPARTMENT_OTHER): Payer: Medicare Other

## 2020-05-31 ENCOUNTER — Ambulatory Visit (HOSPITAL_BASED_OUTPATIENT_CLINIC_OR_DEPARTMENT_OTHER)
Admission: RE | Admit: 2020-05-31 | Discharge: 2020-05-31 | Disposition: A | Discharge status: Home / Self Care | Payer: Medicare Other | Source: Ambulatory Visit | Attending: General Surgery | Admitting: General Surgery

## 2020-05-31 ENCOUNTER — Encounter (HOSPITAL_BASED_OUTPATIENT_CLINIC_OR_DEPARTMENT_OTHER): Payer: Self-pay

## 2020-05-31 ENCOUNTER — Other Ambulatory Visit: Payer: Self-pay

## 2020-05-31 ENCOUNTER — Ambulatory Visit (HOSPITAL_BASED_OUTPATIENT_CLINIC_OR_DEPARTMENT_OTHER): Payer: Medicare Other

## 2020-05-31 ENCOUNTER — Ambulatory Visit (HOSPITAL_BASED_OUTPATIENT_CLINIC_OR_DEPARTMENT_OTHER)
Admission: RE | Admit: 2020-05-31 | Discharge: 2020-05-31 | Disposition: A | Discharge status: Home / Self Care | Payer: Medicare Other | Source: Ambulatory Visit | Attending: Family Medicine | Admitting: Family Medicine

## 2020-05-31 DIAGNOSIS — R911 Solitary pulmonary nodule: Secondary | ICD-10-CM | POA: Insufficient documentation

## 2020-05-31 DIAGNOSIS — R1032 Left lower quadrant pain: Secondary | ICD-10-CM | POA: Diagnosis not present

## 2020-05-31 DIAGNOSIS — J984 Other disorders of lung: Secondary | ICD-10-CM | POA: Diagnosis not present

## 2020-05-31 DIAGNOSIS — I700 Atherosclerosis of aorta: Secondary | ICD-10-CM | POA: Diagnosis not present

## 2020-05-31 DIAGNOSIS — R918 Other nonspecific abnormal finding of lung field: Secondary | ICD-10-CM | POA: Diagnosis not present

## 2020-05-31 DIAGNOSIS — R1030 Lower abdominal pain, unspecified: Secondary | ICD-10-CM | POA: Diagnosis not present

## 2020-05-31 DIAGNOSIS — S2242XA Multiple fractures of ribs, left side, initial encounter for closed fracture: Secondary | ICD-10-CM | POA: Diagnosis not present

## 2020-06-01 ENCOUNTER — Other Ambulatory Visit: Payer: Self-pay | Admitting: Family Medicine

## 2020-06-01 DIAGNOSIS — R911 Solitary pulmonary nodule: Secondary | ICD-10-CM

## 2020-06-10 ENCOUNTER — Other Ambulatory Visit (HOSPITAL_BASED_OUTPATIENT_CLINIC_OR_DEPARTMENT_OTHER): Payer: Medicare Other

## 2020-06-13 DIAGNOSIS — R490 Dysphonia: Secondary | ICD-10-CM | POA: Diagnosis not present

## 2020-06-19 DIAGNOSIS — J069 Acute upper respiratory infection, unspecified: Secondary | ICD-10-CM | POA: Diagnosis not present

## 2020-06-23 DIAGNOSIS — Z23 Encounter for immunization: Secondary | ICD-10-CM | POA: Diagnosis not present

## 2020-07-04 DIAGNOSIS — H9209 Otalgia, unspecified ear: Secondary | ICD-10-CM | POA: Diagnosis not present

## 2020-07-04 DIAGNOSIS — Z03818 Encounter for observation for suspected exposure to other biological agents ruled out: Secondary | ICD-10-CM | POA: Diagnosis not present

## 2020-07-22 ENCOUNTER — Encounter: Payer: Self-pay | Admitting: Family Medicine

## 2020-07-23 ENCOUNTER — Other Ambulatory Visit: Payer: Self-pay | Admitting: Family Medicine

## 2020-07-23 ENCOUNTER — Encounter: Payer: Self-pay | Admitting: Family Medicine

## 2020-07-23 MED ORDER — NIRMATRELVIR/RITONAVIR (PAXLOVID)TABLET
ORAL_TABLET | ORAL | 0 refills | Status: DC
Start: 2020-07-23 — End: 2020-08-23

## 2020-07-23 NOTE — Telephone Encounter (Signed)
Pt is aware of instructions below

## 2020-08-05 ENCOUNTER — Telehealth: Payer: Self-pay

## 2020-08-05 NOTE — Telephone Encounter (Signed)
Pt states she is in [place] , and believes she is getting an early onset of  Diverticulitis and wants medicine suggestions to hold her over until she comes back on Sunday , patient is not near a pharmacy .    Pt states she has been taking aleve and had some left over zpak

## 2020-08-06 ENCOUNTER — Other Ambulatory Visit: Payer: Self-pay

## 2020-08-06 MED ORDER — CIPROFLOXACIN HCL 500 MG PO TABS: 500.0000 mg | ORAL_TABLET | Freq: Two times a day (BID) | ORAL | 0 refills | Status: AC

## 2020-08-06 MED ORDER — METRONIDAZOLE 500 MG PO TABS: 500.0000 mg | ORAL_TABLET | Freq: Three times a day (TID) | ORAL | 0 refills | Status: AC

## 2020-08-06 NOTE — Telephone Encounter (Signed)
Lvm with details below

## 2020-08-10 DIAGNOSIS — Z20822 Contact with and (suspected) exposure to covid-19: Secondary | ICD-10-CM | POA: Diagnosis not present

## 2020-08-17 ENCOUNTER — Ambulatory Visit (INDEPENDENT_AMBULATORY_CARE_PROVIDER_SITE_OTHER): Payer: Medicare Other

## 2020-08-17 VITALS — Ht 66.0 in | Wt 126.0 lb

## 2020-08-17 DIAGNOSIS — Z0000 Encounter for general adult medical examination without abnormal findings: Secondary | ICD-10-CM

## 2020-08-17 NOTE — Patient Instructions (Signed)
Carrie [name] , Thank you for taking time to complete your Medicare Wellness Visit. I appreciate your ongoing commitment to your health goals. Please review the following plan we discussed and let me know if I can assist you in the future.   Screening recommendations/referrals: Colonoscopy: Completed 01/13/2016 Mammogram: Per our conversation, you have an upcoming appt. Bone Density: Completed 06/18/2019-Due 06/17/2021 Recommended yearly ophthalmology/optometry visit for glaucoma screening and checkup Recommended yearly dental visit for hygiene and checkup  Vaccinations: Influenza vaccine: Up to date Pneumococcal vaccine: Up to date Tdap vaccine: Up to date-Due-06/20/2021 Shingles vaccine: Discuss with pharmacy/Insurance   Covid-19:Up to date  Advanced directives: Please bring a copy for your chart  Conditions/risks identified: See problem list  Next appointment: Follow up in one year for your annual wellness visit    Preventive Care 73 Years and Older, Female Preventive care refers to lifestyle choices and visits with your health care provider that can promote health and wellness. What does preventive care include? A yearly physical exam. This is also called an annual well check. Dental exams once or twice a year. Routine eye exams. Ask your health care provider how often you should have your eyes checked. Personal lifestyle choices, including: Daily care of your teeth and gums. Regular physical activity. Eating a healthy diet. Avoiding tobacco and drug use. Limiting alcohol use. Practicing safe sex. Taking low-dose aspirin every day. Taking vitamin and mineral supplements as recommended by your health care provider. What happens during an annual well check? The services and screenings done by your health care provider during your annual well check will depend on your age, overall health, lifestyle risk factors, and family history of disease. Counseling  Your health care provider  may ask you questions about your: Alcohol use. Tobacco use. Drug use. Emotional well-being. Home and relationship well-being. Sexual activity. Eating habits. History of falls. Memory and ability to understand (cognition). Work and work Statistician. Reproductive health. Screening  You may have the following tests or measurements: Height, weight, and BMI. Blood pressure. Lipid and cholesterol levels. These may be checked every 5 years, or more frequently if you are over 73 years old. Skin check. Lung cancer screening. You may have this screening every year starting at age 73 if you have a 30-pack-year history of smoking and currently smoke or have quit within the past 15 years. Fecal occult blood test (FOBT) of the stool. You may have this test every year starting at age 73. Flexible sigmoidoscopy or colonoscopy. You may have a sigmoidoscopy every 5 years or a colonoscopy every 10 years starting at age 73. Hepatitis C blood test. Hepatitis B blood test. Sexually transmitted disease (STD) testing. Diabetes screening. This is done by checking your blood sugar (glucose) after you have not eaten for a while (fasting). You may have this done every 1-3 years. Bone density scan. This is done to screen for osteoporosis. You may have this done starting at age 73. Mammogram. This may be done every 1-2 years. Talk to your health care provider about how often you should have regular mammograms. Talk with your health care provider about your test results, treatment options, and if necessary, the need for more tests. Vaccines  Your health care provider may recommend certain vaccines, such as: Influenza vaccine. This is recommended every year. Tetanus, diphtheria, and acellular pertussis (Tdap, Td) vaccine. You may need a Td booster every 10 years. Zoster vaccine. You may need this after age 73. Pneumococcal 13-valent conjugate (PCV13) vaccine. One dose is  recommended after age 73. Pneumococcal  polysaccharide (PPSV23) vaccine. One dose is recommended after age 73. Talk to your health care provider about which screenings and vaccines you need and how often you need them. This information is not intended to replace advice given to you by your health care provider. Make sure you discuss any questions you have with your health care provider. Document Released: 02/19/2015 Document Revised: 10/13/2015 Document Reviewed: 11/24/2014 Elsevier Interactive Patient Education  2017 [place] Prevention in the Home Falls can cause injuries. They can happen to people of all ages. There are many things you can do to make your home safe and to help prevent falls. What can I do on the outside of my home? Regularly fix the edges of walkways and driveways and fix any cracks. Remove anything that might make you trip as you walk through a door, such as a raised step or threshold. Trim any bushes or trees on the path to your home. Use bright outdoor lighting. Clear any walking paths of anything that might make someone trip, such as rocks or tools. Regularly check to see if handrails are loose or broken. Make sure that both sides of any steps have handrails. Any raised decks and porches should have guardrails on the edges. Have any leaves, snow, or ice cleared regularly. Use sand or salt on walking paths during winter. Clean up any spills in your garage right away. This includes oil or grease spills. What can I do in the bathroom? Use night lights. Install grab bars by the toilet and in the tub and shower. Do not use towel bars as grab bars. Use non-skid mats or decals in the tub or shower. If you need to sit down in the shower, use a plastic, non-slip stool. Keep the floor dry. Clean up any water that spills on the floor as soon as it happens. Remove soap buildup in the tub or shower regularly. Attach bath mats securely with double-sided non-slip rug tape. Do not have throw rugs and other  things on the floor that can make you trip. What can I do in the bedroom? Use night lights. Make sure that you have a light by your bed that is easy to reach. Do not use any sheets or blankets that are too big for your bed. They should not hang down onto the floor. Have a firm chair that has side arms. You can use this for support while you get dressed. Do not have throw rugs and other things on the floor that can make you trip. What can I do in the kitchen? Clean up any spills right away. Avoid walking on wet floors. Keep items that you use a lot in easy-to-reach places. If you need to reach something above you, use a strong step stool that has a grab bar. Keep electrical cords out of the way. Do not use floor polish or wax that makes floors slippery. If you must use wax, use non-skid floor wax. Do not have throw rugs and other things on the floor that can make you trip. What can I do with my stairs? Do not leave any items on the stairs. Make sure that there are handrails on both sides of the stairs and use them. Fix handrails that are broken or loose. Make sure that handrails are as long as the stairways. Check any carpeting to make sure that it is firmly attached to the stairs. Fix any carpet that is loose or worn. Avoid having  throw rugs at the top or bottom of the stairs. If you do have throw rugs, attach them to the floor with carpet tape. Make sure that you have a light switch at the top of the stairs and the bottom of the stairs. If you do not have them, ask someone to add them for you. What else can I do to help prevent falls? Wear shoes that: Do not have high heels. Have rubber bottoms. Are comfortable and fit you well. Are closed at the toe. Do not wear sandals. If you use a stepladder: Make sure that it is fully opened. Do not climb a closed stepladder. Make sure that both sides of the stepladder are locked into place. Ask someone to hold it for you, if possible. Clearly  mark and make sure that you can see: Any grab bars or handrails. First and last steps. Where the edge of each step is. Use tools that help you move around (mobility aids) if they are needed. These include: Canes. Walkers. Scooters. Crutches. Turn on the lights when you go into a dark area. Replace any light bulbs as soon as they burn out. Set up your furniture so you have a clear path. Avoid moving your furniture around. If any of your floors are uneven, fix them. If there are any pets around you, be aware of where they are. Review your medicines with your doctor. Some medicines can make you feel dizzy. This can increase your chance of falling. Ask your doctor what other things that you can do to help prevent falls. This information is not intended to replace advice given to you by your health care provider. Make sure you discuss any questions you have with your health care provider. Document Released: 11/19/2008 Document Revised: 07/01/2015 Document Reviewed: 02/27/2014 Elsevier Interactive Patient Education  2017 [employer].

## 2020-08-17 NOTE — Progress Notes (Signed)
Subjective:   Carrie [name] is a 75 y.o. female who presents for Medicare Annual (Subsequent) preventive examination.  I connected with Madaleine today by telephone and verified that I am speaking with the correct person using two identifiers. Location patient: home Location provider: work Persons participating in the virtual visit: patient, Marine scientist.    I discussed the limitations, risks, security and privacy concerns of performing an evaluation and management service by telephone and the availability of in person appointments. I also discussed with the patient that there may be a patient responsible charge related to this service. The patient expressed understanding and verbally consented to this telephonic visit.    Interactive audio and video telecommunications were attempted between this provider and patient, however failed, due to patient having technical difficulties OR patient did not have access to video capability.  We continued and completed visit with audio only.  Some vital signs may be absent or patient reported.   Time Spent with patient on telephone encounter: 20 minutes   Review of Systems     Cardiac Risk Factors include: advanced age (>46men, >59 women);dyslipidemia     Objective:    Today's Vitals   08/17/20 1502  Weight: 126 lb (57.2 kg)  Height: 5\' 6"  (1.676 m)   Body mass index is 20.34 kg/m.  Advanced Directives 08/17/2020 03/03/2020 03/31/2019 03/19/2018 01/23/2017 01/21/2016  Does Patient Have a Medical Advance Directive? Yes Yes Yes Yes Yes;No Yes  Type of Paramedic of [place];Living will - [place];Living will [place];Living will [place];Living will [place];Living will  Does patient want to make changes to medical advance directive? - - No - Patient declined No - Patient declined - -  Copy of [place] in Chart? No - copy  requested - No - copy requested No - copy requested No - copy requested No - copy requested    Current Medications (verified) Outpatient Encounter Medications as of 08/17/2020  Medication Sig   Ascorbic Acid (VITAMIN C) 1000 MG tablet Take 1,000 mg by mouth daily.   Calcium-Vitamin D-Vitamin K (CALCIUM SOFT CHEWS PO) Take 1 tablet by mouth 2 (two) times daily.   EPINEPHrine (EPIPEN 2-PAK) 0.3 mg/0.3 mL IJ SOAJ injection Inject 0.3 mg into the muscle as needed for anaphylaxis.   famotidine (PEPCID) 40 MG tablet Take by mouth.   glucosamine-chondroitin 500-400 MG tablet Take 1 tablet by mouth daily.   Lactobacillus (PROBIOTIC ACIDOPHILUS PO) Take by mouth.   metoprolol succinate (TOPROL-XL) 50 MG 24 hr tablet TAKE 1 TABLET (50 MG TOTAL) BY MOUTH DAILY. TAKE WITH OR IMMEDIATELY FOLLOWING A MEAL.   nirmatrelvir/ritonavir EUA (PAXLOVID) TABS (Take nirmatrelvir 150 mg two tablets twice daily for 5 days and ritonavir 100 mg one tablet twice daily for 5 days) Patient GFR is 87.58.   traMADol (ULTRAM) 50 MG tablet Take 1 tablet (50 mg total) by mouth every 12 (twelve) hours as needed.   Vitamins-Lipotropics (B-50) TABS Take 1 tablet by mouth daily.   zolpidem (AMBIEN) 5 MG tablet Take 1 tablet (5 mg total) by mouth at bedtime as needed for sleep.   Cholecalciferol (VITAMIN D3) 1000 UNITS CAPS Take 1 capsule by mouth daily.   No facility-administered encounter medications on file as of 08/17/2020.    Allergies (verified) Hydrocodone-acetaminophen, Codeine, Demerol, Doxycycline, and Meperidine hcl   History: Past Medical History:  Diagnosis Date   Breast cancer ([place]) 08/2004   Left  breast invasive ductal carcinoma   CHICKENPOX, HX OF 03/03/2010   COMMON MIGRAINE 03/03/2010   DEGENERATIVE DISC DISEASE 03/03/2010   Essential hypertension, benign 03/03/2010   Foot pain, bilateral 02/08/2016   HYPOKALEMIA 03/03/2010   Increased vitamin B12 level 01/10/2015   Insomnia 08/01/2016   Low back pain 08/01/2016    MEASLES, HX OF 03/03/2010   Melanoma of skin, site unspecified 03/03/2010   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   NEOPLASM, MALIGNANT, BREAST, HX OF 03/03/2010   OSTEOPENIA 03/03/2010   PALPITATIONS, HX OF 03/03/2010   Pulmonary nodules 01/07/2015   Rib fracture    left, 3 fractures s/p radiation: right side 2 fractured, no surgery   Past Surgical History:  Procedure Laterality Date   BREAST LUMPECTOMY WITH NEEDLE LOCALIZATION AND AXILLARY SENTINEL LYMPH NODE BX Left 08/2004   clavicle dislocate  75 yrs old   right, reapproximated w/pins, subsequently removed   HERNIA REPAIR  05/2012   left hip traumatic history     hematoma surgically evacuated   SHOULDER ARTHROSCOPY     b/l shoulder with debridement   TONSILLECTOMY     Family History  Problem Relation Age of Onset   Hypertension Mother    COPD Mother    Alcohol abuse Father        history of   Coronary artery disease Father    Osteoarthritis Brother        knees   Basal cell carcinoma Sister    Heart disease Maternal Grandmother    Stroke Maternal Grandfather    Osteoarthritis Sister        knees   Migraines Sister    Social History   Socioeconomic History   Marital status: Married    Spouse name: Not on file   Number of children: Not on file   Years of education: Not on file   Highest education level: Not on file  Occupational History   Not on file  Tobacco Use   Smoking status: Former    Packs/day: 0.25    Pack years: 0.00    Types: Cigarettes    Quit date: 02/07/1968    Years since quitting: 52.5   Smokeless tobacco: Never  Vaping Use   Vaping Use: Never used  Substance and Sexual Activity   Alcohol use: Yes    Comment: weekly   Drug use: No   Sexual activity: Not Currently    Birth control/protection: Post-menopausal  Other Topics Concern   Not on file  Social History Narrative   Not on file   Social Determinants of Health   Financial Resource Strain: Low Risk    Difficulty of Paying Living Expenses:  Not hard at all  Food Insecurity: No Food Insecurity   Worried About Charity fundraiser in the Last Year: Never true   Arboriculturist in the Last Year: Never true  Transportation Needs: No Transportation Needs   Lack of Transportation (Medical): No   Lack of Transportation (Non-Medical): No  Physical Activity: Sufficiently Active   Days of Exercise per Week: 5 days   Minutes of Exercise per Session: 60 min  Stress: No Stress Concern Present   Feeling of Stress : Not at all  Social Connections: Socially Integrated   Frequency of Communication with Friends and Family: More than three times a week   Frequency of Social Gatherings with Friends and Family: More than three times a week   Attends Religious Services: 1 to 4 times per year   Active  Member of Clubs or Organizations: Yes   Attends Archivist Meetings: 1 to 4 times per year   Marital Status: Married    Tobacco Counseling Counseling given: Not Answered   Clinical Intake:  Pre-visit preparation completed: Yes  Pain : No/denies pain     Nutritional Status: BMI of 19-24  Normal Nutritional Risks: None Diabetes: No  How often do you need to have someone help you when you read instructions, pamphlets, or other written materials from your doctor or pharmacy?: 1 - Never  Diabetic?No  Interpreter Needed?: No  Information entered by :: Caroleen Hamman LPN   Activities of Daily Living In your present state of health, do you have any difficulty performing the following activities: 08/17/2020 05/11/2020  Hearing? N N  Vision? N N  Difficulty concentrating or making decisions? N N  Walking or climbing stairs? N N  Dressing or bathing? N N  Doing errands, shopping? N N  Preparing Food and eating ? N -  Using the Toilet? N -  In the past six months, have you accidently leaked urine? Y -  Comment occasionally -  Do you have problems with loss of bowel control? N -  Managing your Medications? N -  Managing your  Finances? N -  Housekeeping or managing your Housekeeping? N -  Some recent data might be hidden    Patient Care Team: [name], MD as PCP - General (Family Medicine) Danella Sensing, MD as Consulting Physician (Dermatology) Richmond Campbell, MD as Consulting Physician (Gastroenterology) Fay Records, MD as Consulting Physician (Cardiology) Linda Hedges, DO as Consulting Physician (Obstetrics and Gynecology) Kathie Rhodes, MD (Inactive) as Consulting Physician (Urology)  Indicate any recent Medical Services you may have received from other than [facility] providers in the past year (date may be approximate).     Assessment:   This is a routine wellness examination for [place].  Hearing/Vision screen Hearing Screening - Comments:: No issues Vision Screening - Comments:: Wears one contact for distance Last eye exam-Spring-2022-Dr. Sherral Hammers  Dietary issues and exercise activities discussed: Current Exercise Habits: Home exercise routine, Type of exercise: strength training/weights;walking, Time (Minutes): 60, Frequency (Times/Week): 5, Weekly Exercise (Minutes/Week): 300, Intensity: Mild, Exercise limited by: None identified   Goals Addressed             This Visit's Progress    Healthy Lifestyle   On track    Continue to eat heart healthy diet (full of fruits, vegetables, whole grains, lean protein, water--limit salt, fat, and sugar intake) and increase physical activity as tolerated. Continue doing brain stimulating activities (puzzles, reading, adult coloring books, staying active) to keep memory sharp.          Depression Screen PHQ 2/9 Scores 08/17/2020 05/11/2020 03/31/2019 03/19/2018 01/23/2017 01/21/2016 07/13/2015  PHQ - 2 Score 0 0 0 0 0 0 0    Fall Risk Fall Risk  08/17/2020 05/11/2020 03/31/2019 03/19/2018 12/24/2017  Falls in the past year? 0 0 0 0 0  Comment - - - - Emmi Telephone Survey: data to providers prior to load  Number falls in past yr: 0 0 0 - -  Injury  with Fall? 0 0 0 - -  Follow up Falls prevention discussed - Education provided;Falls prevention discussed - -    FALL RISK PREVENTION PERTAINING TO THE HOME:  Any stairs in or around the home? Yes  If so, are there any without handrails? No  Home free of loose throw rugs in walkways,  pet beds, electrical cords, etc? Yes  Adequate lighting in your home to reduce risk of falls? Yes   ASSISTIVE DEVICES UTILIZED TO PREVENT FALLS:  Life alert? No  Use of a cane, walker or w/c? No  Grab bars in the bathroom? No  Shower chair or bench in shower? No  Elevated toilet seat or a handicapped toilet? No   TIMED UP AND GO:  Was the test performed? No . Phone visit   Cognitive Function:Normal cognitive status assessed by this Nurse Health Advisor. No abnormalities found.   MMSE - Mini Mental State Exam 01/21/2016  Orientation to time 5  Orientation to Place 5  Registration 3  Attention/ Calculation 5  Recall 3  Language- name 2 objects 2  Language- repeat 1  Language- follow 3 step command 3  Language- read & follow direction 1  Write a sentence 1  Copy design 1  Total score 30        Immunizations Immunization History  Administered Date(s) Administered   Fluad Quad(high Dose 65+) 10/10/2018   Influenza Whole 10/07/2009   Influenza, High Dose Seasonal PF 10/31/2017, 11/25/2019   Influenza,inj,Quad PF,6+ Mos 01/07/2015   Influenza-Unspecified 11/07/2015   PFIZER Comirnaty(Gray Top)Covid-19 Tri-Sucrose Vaccine 06/23/2020   PFIZER(Purple Top)SARS-COV-2 Vaccination 03/21/2019, 04/15/2019, 11/16/2019   Pneumococcal Conjugate-13 12/16/2013   Pneumococcal Polysaccharide-23 03/19/2018   Tdap 06/21/2011    TDAP status: Up to date  Flu Vaccine status: Up to date  Pneumococcal vaccine status: Up to date  Covid-19 vaccine status: Completed vaccines  Qualifies for Shingles Vaccine? Yes   Zostavax completed No   Shingrix Completed?: No.    Education has been provided regarding  the importance of this vaccine. Patient has been advised to call insurance company to determine out of pocket expense if they have not yet received this vaccine. Advised may also receive vaccine at local pharmacy or Health Dept. Verbalized acceptance and understanding.  Screening Tests Health Maintenance  Topic Date Due   Zoster Vaccines- Shingrix (1 of 2) Never done   INFLUENZA VACCINE  09/06/2020   COVID-19 Vaccine (5 - Booster for Pfizer series) 10/24/2020   MAMMOGRAM  06/17/2021   TETANUS/TDAP  06/20/2021   COLONOSCOPY (Pts 45-39yrs Insurance coverage will need to be confirmed)  01/12/2026   DEXA SCAN  Completed   Hepatitis C Screening  Completed   PNA vac Low Risk Adult  Completed   HPV VACCINES  Aged Out    Health Maintenance  Health Maintenance Due  Topic Date Due   Zoster Vaccines- Shingrix (1 of 2) Never done    Colorectal cancer screening: Type of screening: Colonoscopy. Completed 01/13/2016. Repeat every 10 years  Mammogram status: patient sates she has an upcoming appt  Bone Density status: Completed 06/18/2019. Results reflect: Bone density results: OSTEOPENIA. Repeat every 2 years.  Lung Cancer Screening: (Low Dose CT Chest recommended if Age 46-80 years, 30 pack-year currently smoking OR have quit w/in 15years.) does not qualify.     Additional Screening:  Hepatitis C Screening: Completed 01/07/2015  Vision Screening: Recommended annual ophthalmology exams for early detection of glaucoma and other disorders of the eye. Is the patient up to date with their annual eye exam?  Yes  Who is the provider or what is the name of the office in which the patient attends annual eye exams? Dr. Sherral Hammers   Dental Screening: Recommended annual dental exams for proper oral hygiene  Community Resource Referral / Chronic Care Management: CRR required this visit?  No  CCM required this visit?  No      Plan:     I have personally reviewed and noted the following in the  patient's chart:   Medical and social history Use of alcohol, tobacco or illicit drugs  Current medications and supplements including opioid prescriptions.  Functional ability and status Nutritional status Physical activity Advanced directives List of other physicians Hospitalizations, surgeries, and ER visits in previous 12 months Vitals Screenings to include cognitive, depression, and falls Referrals and appointments  In addition, I have reviewed and discussed with patient certain preventive protocols, quality metrics, and best practice recommendations. A written personalized care plan for preventive services as well as general preventive health recommendations were provided to patient.   Due to this being a telephonic visit, the after visit summary with patients personalized plan was offered to patient via mail or my-chart. Patient would like to access on my-chart.   Marta Antu, LPN   9/79/1504  Nurse Health Advisor  Nurse Notes: None

## 2020-08-23 ENCOUNTER — Ambulatory Visit (INDEPENDENT_AMBULATORY_CARE_PROVIDER_SITE_OTHER): Payer: Medicare Other | Admitting: Family Medicine

## 2020-08-23 ENCOUNTER — Encounter: Payer: Self-pay | Admitting: Family Medicine

## 2020-08-23 ENCOUNTER — Other Ambulatory Visit: Payer: Self-pay

## 2020-08-23 DIAGNOSIS — E785 Hyperlipidemia, unspecified: Secondary | ICD-10-CM | POA: Diagnosis not present

## 2020-08-23 DIAGNOSIS — I10 Essential (primary) hypertension: Secondary | ICD-10-CM

## 2020-08-23 DIAGNOSIS — K5792 Diverticulitis of intestine, part unspecified, without perforation or abscess without bleeding: Secondary | ICD-10-CM

## 2020-08-23 DIAGNOSIS — M79671 Pain in right foot: Secondary | ICD-10-CM | POA: Diagnosis not present

## 2020-08-23 DIAGNOSIS — G4700 Insomnia, unspecified: Secondary | ICD-10-CM | POA: Diagnosis not present

## 2020-08-23 DIAGNOSIS — E538 Deficiency of other specified B group vitamins: Secondary | ICD-10-CM | POA: Diagnosis not present

## 2020-08-23 MED ORDER — ZOLPIDEM TARTRATE 5 MG PO TABS: 5.0000 mg | ORAL_TABLET | Freq: Every evening | ORAL | 1 refills | Status: AC | PRN

## 2020-08-23 NOTE — Assessment & Plan Note (Signed)
Supplement and monitor 

## 2020-08-23 NOTE — Assessment & Plan Note (Signed)
Encouraged good sleep hygiene such as dark, quiet room. No blue/green glowing lights such as computer screens in bedroom. No alcohol or stimulants in evening. Cut down on caffeine as able. Regular exercise is helpful but not just prior to bed time. Refill given on Ambien

## 2020-08-23 NOTE — Assessment & Plan Note (Signed)
Encourage heart healthy diet such as MIND or DASH diet, increase exercise, avoid trans fats, simple carbohydrates and processed foods, consider a krill or fish or flaxseed oil cap daily.  °

## 2020-08-23 NOTE — Progress Notes (Deleted)
ERROR

## 2020-08-23 NOTE — Assessment & Plan Note (Signed)
Pain at base of 3rd and 4th toes especially with tight shoes will change foot wear and consider podiatry referral if worsens

## 2020-08-23 NOTE — Progress Notes (Deleted)
Subjective:   By signing my name below, I, Carrie [name], attest that this documentation has been prepared under the direction and in the presence of Dr. [name], MD. 08/23/2020   Patient ID: Carrie [name], female    DOB: 09-18-1945, 75 y.o.   MRN: [id number]  Chief Complaint  Patient presents with   Follow-up   Hyperlipidemia   Anxiety    HPI Patient is in today for a office visit. She complains of a nerve pain in her 2nd and 3rd toe. The pain worsens while wearing boots.  She is requesting to have her vitamin B12 and LDL levels checked during her next lab screening. She reports having pneumonia in January, 2022. She notes since recovering from pneumonia she gets ill easily. She reports participating in exercise regularly by walking on a treadmill and strength training. She reports since recovering from pneumonia she has difficultly completing her exercise and has SOB. She has had a CT of her chest on 05/31/2020 and results showed interval resolution of previous right lower lobe pneumonia. She continues taking 50 mg metoprolol succinate daily PO. She reports not taking 5 mg Ambien regularly. She wants to continues this prescription. She prefers to use this medication as a last resort to manage her symptoms. While in [place] she reports suffering from diverticulitis. To manage her symptoms she started using a Z-Pak and reports doing well while on it.  She reports recently recovering from Covid-19 symptoms.   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   NEOPLASM, MALIGNANT, BREAST, HX OF 03/03/2010   OSTEOPENIA 03/03/2010   PALPITATIONS, HX OF 03/03/2010   Pulmonary nodules 01/07/2015   Rib fracture    left, 3 fractures s/p radiation: right side 2 fractured, no surgery    Past Surgical History:  Procedure Laterality Date   BREAST LUMPECTOMY WITH NEEDLE LOCALIZATION AND AXILLARY SENTINEL LYMPH NODE BX Left 08/2004   clavicle dislocate  75 yrs old   right, reapproximated w/pins, subsequently removed   HERNIA REPAIR  05/2012   left hip traumatic history     hematoma surgically evacuated   SHOULDER ARTHROSCOPY     b/l shoulder with debridement   TONSILLECTOMY      Family History  Problem Relation Age of Onset   Hypertension Mother    COPD Mother    Alcohol abuse Father        history of   Coronary artery disease Father    Osteoarthritis Brother        knees   Basal cell carcinoma Sister    Heart disease Maternal Grandmother    Stroke Maternal Grandfather    Osteoarthritis Sister        knees   Migraines Sister     Social History   Socioeconomic History   Marital status: Married    Spouse name: Not on file   Number of children: Not on file   Years of education: Not on file   Highest education level: Not on file  Occupational History  Not on file  Tobacco Use   Smoking status: Former    Packs/day: 0.25    Types: Cigarettes    Quit date: 02/07/1968    Years since quitting: 52.5   Smokeless tobacco: Never  Vaping Use   Vaping Use: Never used  Substance and Sexual Activity   Alcohol use: Yes    Comment: weekly   Drug use: No   Sexual activity: Not Currently    Birth control/protection: Post-menopausal  Other Topics Concern   Not on file  Social History Narrative   Not on file   Social Determinants of Health   Financial Resource Strain: Low Risk    Difficulty of Paying Living Expenses: Not hard at all  Food Insecurity: No Food Insecurity   Worried About Charity fundraiser in  the Last Year: Never true   Ran Out of Food in the Last Year: Never true  Transportation Needs: No Transportation Needs   Lack of Transportation (Medical): No   Lack of Transportation (Non-Medical): No  Physical Activity: Sufficiently Active   Days of Exercise per Week: 5 days   Minutes of Exercise per Session: 60 min  Stress: No Stress Concern Present   Feeling of Stress : Not at all  Social Connections: Socially Integrated   Frequency of Communication with Friends and Family: More than three times a week   Frequency of Social Gatherings with Friends and Family: More than three times a week   Attends Religious Services: 1 to 4 times per year   Active Member of [employer] or Organizations: Yes   Attends Archivist Meetings: 1 to 4 times per year   Marital Status: Married  Human resources officer Violence: Not At Risk   Fear of Current or Ex-Partner: No   Emotionally Abused: No   Physically Abused: No   Sexually Abused: No    Outpatient Medications Prior to Visit  Medication Sig Dispense Refill   Ascorbic Acid (VITAMIN C) 1000 MG tablet Take 1,000 mg by mouth daily.     Calcium-Vitamin D-Vitamin K (CALCIUM SOFT CHEWS PO) Take 1 tablet by mouth 2 (two) times daily.     EPINEPHrine (EPIPEN 2-PAK) 0.3 mg/0.3 mL IJ SOAJ injection Inject 0.3 mg into the muscle as needed for anaphylaxis. 1 each 2   famotidine (PEPCID) 40 MG tablet Take by mouth.     glucosamine-chondroitin 500-400 MG tablet Take 1 tablet by mouth daily.     Lactobacillus (PROBIOTIC ACIDOPHILUS PO) Take by mouth.     metoprolol succinate (TOPROL-XL) 50 MG 24 hr tablet TAKE 1 TABLET (50 MG TOTAL) BY MOUTH DAILY. TAKE WITH OR IMMEDIATELY FOLLOWING A MEAL. 90 tablet 1   Vitamins-Lipotropics (B-50) TABS Take 1 tablet by mouth daily.     zolpidem (AMBIEN) 5 MG tablet Take 1 tablet (5 mg total) by mouth at bedtime as needed for sleep. 30 tablet 2   Cholecalciferol (VITAMIN D3) 1000 UNITS CAPS Take 1 capsule by mouth daily.      nirmatrelvir/ritonavir EUA (PAXLOVID) TABS (Take nirmatrelvir 150 mg two tablets twice daily for 5 days and ritonavir 100 mg one tablet twice daily for 5 days) Patient GFR is 87.58. 30 tablet 0   traMADol (ULTRAM) 50 MG tablet Take 1 tablet (50 mg total) by mouth every 12 (twelve) hours as needed. 6 tablet 0   No facility-administered medications prior to visit.    Allergies  Allergen Reactions   Hydrocodone-Acetaminophen Other (See Comments)    Slows Respiration Rate Down  Codeine     REACTION: upsets stomach   Demerol     hives   Doxycycline Hives   Meperidine Hcl     REACTION: Hives    Review of Systems  Musculoskeletal:  Positive for joint pain (2nd and 3rd toe).      Objective:    Physical Exam Constitutional:      General: She is not in acute distress.    Appearance: She is well-developed.  HENT:     Head: Normocephalic and atraumatic.  Eyes:     Conjunctiva/sclera: Conjunctivae normal.  Neck:     Thyroid: No thyromegaly.  Cardiovascular:     Rate and Rhythm: Normal rate and regular rhythm.     Heart sounds: Normal heart sounds. No murmur heard. Pulmonary:     Effort: Pulmonary effort is normal. No respiratory distress.     Breath sounds: Normal breath sounds.  Abdominal:     General: Bowel sounds are normal. There is no distension.     Palpations: Abdomen is soft.     Tenderness: There is no abdominal tenderness.  Musculoskeletal:     Cervical back: Neck supple.  Lymphadenopathy:     Cervical: No cervical adenopathy.  Skin:    General: Skin is warm and dry.  Neurological:     Mental Status: She is alert and oriented to person, place, and time.  Psychiatric:        Behavior: Behavior normal.    BP 122/78   Pulse (!) 52   Temp 98.5 F (36.9 C)   Resp 16   Wt 124 lb 9.6 oz (56.5 kg)   SpO2 96%   BMI 20.11 kg/m  Wt Readings from Last 3 Encounters:  08/23/20 124 lb 9.6 oz (56.5 kg)  08/17/20 126 lb (57.2 kg)  05/11/20 126 lb (57.2 kg)     Diabetic Foot Exam - Simple   No data filed    Lab Results  Component Value Date   WBC 4.5 03/15/2020   HGB 13.7 03/15/2020   HCT 39.6 03/15/2020   PLT 298.0 03/15/2020   GLUCOSE 87 03/15/2020   CHOL 189 03/15/2020   TRIG 103.0 03/15/2020   HDL 69.30 03/15/2020   LDLDIRECT 94.8 07/28/2010   LDLCALC 99 03/15/2020   ALT 9 03/15/2020   AST 14 03/15/2020   NA 139 03/15/2020   K 3.8 03/15/2020   CL 103 03/15/2020   CREATININE 0.66 03/15/2020   BUN 15 03/15/2020   CO2 31 03/15/2020   TSH 2.72 03/15/2020   HGBA1C 5.5 09/25/2017    Lab Results  Component Value Date   TSH 2.72 03/15/2020   Lab Results  Component Value Date   WBC 4.5 03/15/2020   HGB 13.7 03/15/2020   HCT 39.6 03/15/2020   MCV 100.9 (H) 03/15/2020   PLT 298.0 03/15/2020   Lab Results  Component Value Date   NA 139 03/15/2020   K 3.8 03/15/2020   CO2 31 03/15/2020   GLUCOSE 87 03/15/2020   BUN 15 03/15/2020   CREATININE 0.66 03/15/2020   BILITOT 0.6 03/15/2020   ALKPHOS 108 03/15/2020   AST 14 03/15/2020   ALT 9 03/15/2020   PROT 6.2 03/15/2020   ALBUMIN 3.9 03/15/2020   CALCIUM 9.0 03/15/2020   ANIONGAP 13 03/03/2020   GFR 87.58 03/15/2020   Lab Results  Component Value Date   CHOL 189 03/15/2020   Lab Results  Component Value Date   HDL 69.30 03/15/2020   Lab Results  Component  Value Date   LDLCALC 99 03/15/2020   Lab Results  Component Value Date   TRIG 103.0 03/15/2020   Lab Results  Component Value Date   CHOLHDL 3 03/15/2020   Lab Results  Component Value Date   HGBA1C 5.5 09/25/2017       Assessment & Plan:   Problem List Items Addressed This Visit     ESSENTIAL HYPERTENSION, BENIGN     no changes to meds. Encouraged heart healthy diet such as the DASH diet and exercise as tolerated.       Relevant Orders   CBC   Comprehensive metabolic panel   TSH   Lactate dehydrogenase   Foot pain, right    Pain at base of 3rd and 4th toes especially with tight  shoes will change foot wear and consider podiatry referral if worsens      Insomnia    Encouraged good sleep hygiene such as dark, quiet room. No blue/green glowing lights such as computer screens in bedroom. No alcohol or stimulants in evening. Cut down on caffeine as able. Regular exercise is helpful but not just prior to bed time. Refill given on Ambien      Disorder of vitamin B12    Supplement and monitor      Relevant Orders   Vitamin B12   Diverticulitis    Resolved now after course of Flagyl and Cipro      Relevant Orders   Lactate dehydrogenase   Hyperlipidemia    Encourage heart healthy diet such as MIND or DASH diet, increase exercise, avoid trans fats, simple carbohydrates and processed foods, consider a krill or fish or flaxseed oil cap daily.       Relevant Orders   Lipid panel   Lactate dehydrogenase     Meds ordered this encounter  Medications   zolpidem (AMBIEN) 5 MG tablet    Sig: Take 1 tablet (5 mg total) by mouth at bedtime as needed for sleep.    Dispense:  30 tablet    Refill:  1    I, Dr. Charlett Blake, Bonnita Levan, MD, personally preformed the services described in this documentation.  All medical record entries made by the scribe were at my direction and in my presence.  I have reviewed the chart and discharge instructions (if applicable) and agree that the record reflects my personal performance and is accurate and complete. 08/23/2020   I,Carrie [name],acting as a scribe for Penni Homans, MD.,have documented all relevant documentation on the behalf of Penni Homans, MD,as directed by  Penni Homans, MD while in the presence of Penni Homans, MD.   I, [name], MD personally performed the services described in this documentation. All medical record entries made by the scribe were at my direction and in my presence. I have reviewed the chart and agree that the record reflects my personal performance and is accurate and complete    Penni Homans, MD

## 2020-08-23 NOTE — Assessment & Plan Note (Signed)
no changes to meds. Encouraged heart healthy diet such as the DASH diet and exercise as tolerated.  

## 2020-08-23 NOTE — Assessment & Plan Note (Signed)
Resolved now after course of Flagyl and Cipro

## 2020-08-23 NOTE — Patient Instructions (Addendum)
[email or web address] Carrie [name] [email or web address] [id number] [phone] for babysitting Thiamine 518 mg daily Folic Acid [id number] mcg (1mg )   All[employer] shoes Roka  Melatonin Magnesium Glycinate 200-400 mg at bed L Tryptophan  for sleep [place] sleep support  Paxlovid or Molnupiravir is the new COVID medication we can give you if you get COVID so make sure you test if you have symptoms because we have to treat by day 5 of symptoms for it to be effective. If you are positive let us know so we can treat. If a home test is negative and your symptoms are persistent get a PCR test. Can check testing locations at [email or web address] If you are positive we will make an appointment with [place] and we will send in Paxlovid if you would like it. Check with your pharmacy before we meet to confirm they have it in stock, if they do not then we can get the prescription at the [facility]    Hypertension, Adult High blood pressure (hypertension) is when the force of blood pumping through the arteries is too strong. The arteries are the blood vessels that carry blood from the heart throughout the body. Hypertension forces the heart to work harder to pump blood and may cause arteries to become narrow or stiff. Untreated or uncontrolled hypertension can cause a heart attack, heart failure, a stroke, kidney disease, and otherproblems. A blood pressure reading consists of a higher number over a lower number. Ideally, your blood pressure should be below 120/80. The first ("top") number is called the systolic pressure. It is a measure of the pressure in your arteries as your heart beats. The second ("bottom") number is called the diastolic pressure. It is a measure of the pressure in your arteries as theheart relaxes. What are the causes? The exact cause of this condition is not known. There are some conditions thatresult in or are related to high blood pressure. What increases the risk? Some  risk factors for high blood pressure are under your control. The following factors may make you more likely to develop this condition: Smoking. Having type 2 diabetes mellitus, high cholesterol, or both. Not getting enough exercise or physical activity. Being overweight. Having too much fat, sugar, calories, or salt (sodium) in your diet. Drinking too much alcohol. Some risk factors for high blood pressure may be difficult or impossible to change. Some of these factors include: Having chronic kidney disease. Having a family history of high blood pressure. Age. Risk increases with age. Race. You may be at higher risk if you are African American. Gender. Men are at higher risk than women before age 66. After age 42, women are at higher risk than men. Having obstructive sleep apnea. Stress. What are the signs or symptoms? High blood pressure may not cause symptoms. Very high blood pressure (hypertensive crisis) may cause: Headache. Anxiety. Shortness of breath. Nosebleed. Nausea and vomiting. Vision changes. Severe chest pain. Seizures. How is this diagnosed? This condition is diagnosed by measuring your blood pressure while you are seated, with your arm resting on a flat surface, your legs uncrossed, and your feet flat on the floor. The cuff of the blood pressure monitor will be placed directly against the skin of your upper arm at the level of your heart. It should be measured at least twice using the same arm. Certain conditions cancause a difference in blood pressure between your right and left arms. Certain factors can cause blood pressure readings to be lower  or higher than normal for a short period of time: When your blood pressure is higher when you are in a health care provider's office than when you are at home, this is called white coat hypertension. Most people with this condition do not need medicines. When your blood pressure is higher at home than when you are in a health  care provider's office, this is called masked hypertension. Most people with this condition may need medicines to control blood pressure. If you have a high blood pressure reading during one visit or you have normal blood pressure with other risk factors, you may be asked to: Return on a different day to have your blood pressure checked again. Monitor your blood pressure at home for 1 week or longer. If you are diagnosed with hypertension, you may have other blood or imaging tests to help your health care provider understand your overall risk for otherconditions. How is this treated? This condition is treated by making healthy lifestyle changes, such as eating healthy foods, exercising more, and reducing your alcohol intake. Your health care provider may prescribe medicine if lifestyle changes are not enough to get your blood pressure under control, and if: Your systolic blood pressure is above 130. Your diastolic blood pressure is above 80. Your personal target blood pressure may vary depending on your medicalconditions, your age, and other factors. Follow these instructions at home: Eating and drinking  Eat a diet that is high in fiber and potassium, and low in sodium, added sugar, and fat. An example eating plan is called the DASH (Dietary Approaches to Stop Hypertension) diet. To eat this way: Eat plenty of fresh fruits and vegetables. Try to fill one half of your plate at each meal with fruits and vegetables. Eat whole grains, such as whole-wheat pasta, Harr rice, or whole-grain bread. Fill about one fourth of your plate with whole grains. Eat or drink low-fat dairy products, such as skim milk or low-fat yogurt. Avoid fatty cuts of meat, processed or cured meats, and poultry with skin. Fill about one fourth of your plate with lean proteins, such as fish, chicken without skin, beans, eggs, or tofu. Avoid pre-made and processed foods. These tend to be higher in sodium, added sugar, and  fat. Reduce your daily sodium intake. Most people with hypertension should eat less than 1,500 mg of sodium a day. Do not drink alcohol if: Your health care provider tells you not to drink. You are pregnant, may be pregnant, or are planning to become pregnant. If you drink alcohol: Limit how much you use to: 0-1 drink a day for women. 0-2 drinks a day for men. Be aware of how much alcohol is in your drink. In the [place] one drink equals one 12 oz bottle of beer (355 mL), one 5 oz glass of wine (148 mL), or one 1 oz glass of hard liquor (44 mL).  Lifestyle  Work with your health care provider to maintain a healthy body weight or to lose weight. Ask what an ideal weight is for you. Get at least 30 minutes of exercise most days of the week. Activities may include walking, swimming, or biking. Include exercise to strengthen your muscles (resistance exercise), such as Pilates or lifting weights, as part of your weekly exercise routine. Try to do these types of exercises for 30 minutes at least 3 days a week. Do not use any products that contain nicotine or tobacco, such as cigarettes, e-cigarettes, and chewing tobacco. If you need help quitting,  ask your health care provider. Monitor your blood pressure at home as told by your health care provider. Keep all follow-up visits as told by your health care provider. This is important.  Medicines Take over-the-counter and prescription medicines only as told by your health care provider. Follow directions carefully. Blood pressure medicines must be taken as prescribed. Do not skip doses of blood pressure medicine. Doing this puts you at risk for problems and can make the medicine less effective. Ask your health care provider about side effects or reactions to medicines that you should watch for. Contact a health care provider if you: Think you are having a reaction to a medicine you are taking. Have headaches that keep coming back (recurring). Feel  dizzy. Have swelling in your ankles. Have trouble with your vision. Get help right away if you: Develop a severe headache or confusion. Have unusual weakness or numbness. Feel faint. Have severe pain in your chest or abdomen. Vomit repeatedly. Have trouble breathing. Summary Hypertension is when the force of blood pumping through your arteries is too strong. If this condition is not controlled, it may put you at risk for serious complications. Your personal target blood pressure may vary depending on your medical conditions, your age, and other factors. For most people, a normal blood pressure is less than 120/80. Hypertension is treated with lifestyle changes, medicines, or a combination of both. Lifestyle changes include losing weight, eating a healthy, low-sodium diet, exercising more, and limiting alcohol. This information is not intended to replace advice given to you by your health care provider. Make sure you discuss any questions you have with your healthcare provider. Document Revised: 10/03/2017 Document Reviewed: 10/03/2017 Elsevier Patient Education  [place].

## 2020-08-24 ENCOUNTER — Other Ambulatory Visit: Payer: Medicare Other

## 2020-09-21 DIAGNOSIS — R197 Diarrhea, unspecified: Secondary | ICD-10-CM | POA: Diagnosis not present

## 2020-09-21 DIAGNOSIS — R1032 Left lower quadrant pain: Secondary | ICD-10-CM | POA: Diagnosis not present

## 2020-09-22 DIAGNOSIS — R197 Diarrhea, unspecified: Secondary | ICD-10-CM | POA: Diagnosis not present

## 2020-10-11 DIAGNOSIS — Z20822 Contact with and (suspected) exposure to covid-19: Secondary | ICD-10-CM | POA: Diagnosis not present

## 2020-10-25 DIAGNOSIS — Z23 Encounter for immunization: Secondary | ICD-10-CM | POA: Diagnosis not present

## 2020-11-11 DIAGNOSIS — M629 Disorder of muscle, unspecified: Secondary | ICD-10-CM | POA: Diagnosis not present

## 2020-11-11 DIAGNOSIS — N909 Noninflammatory disorder of vulva and perineum, unspecified: Secondary | ICD-10-CM | POA: Diagnosis not present

## 2020-11-13 ENCOUNTER — Other Ambulatory Visit: Payer: Self-pay | Admitting: Family Medicine

## 2020-11-17 DIAGNOSIS — Z23 Encounter for immunization: Secondary | ICD-10-CM | POA: Diagnosis not present

## 2020-12-09 DIAGNOSIS — L821 Other seborrheic keratosis: Secondary | ICD-10-CM | POA: Diagnosis not present

## 2020-12-09 DIAGNOSIS — L565 Disseminated superficial actinic porokeratosis (DSAP): Secondary | ICD-10-CM | POA: Diagnosis not present

## 2020-12-09 DIAGNOSIS — D225 Melanocytic nevi of trunk: Secondary | ICD-10-CM | POA: Diagnosis not present

## 2020-12-09 DIAGNOSIS — L570 Actinic keratosis: Secondary | ICD-10-CM | POA: Diagnosis not present

## 2020-12-09 DIAGNOSIS — D0359 Melanoma in situ of other part of trunk: Secondary | ICD-10-CM | POA: Diagnosis not present

## 2020-12-09 DIAGNOSIS — Z85828 Personal history of other malignant neoplasm of skin: Secondary | ICD-10-CM | POA: Diagnosis not present

## 2020-12-20 DIAGNOSIS — Z853 Personal history of malignant neoplasm of breast: Secondary | ICD-10-CM | POA: Diagnosis not present

## 2020-12-20 DIAGNOSIS — Z1211 Encounter for screening for malignant neoplasm of colon: Secondary | ICD-10-CM | POA: Diagnosis not present

## 2020-12-20 DIAGNOSIS — R197 Diarrhea, unspecified: Secondary | ICD-10-CM | POA: Diagnosis not present

## 2020-12-20 DIAGNOSIS — R1032 Left lower quadrant pain: Secondary | ICD-10-CM | POA: Diagnosis not present

## 2021-01-05 DIAGNOSIS — L818 Other specified disorders of pigmentation: Secondary | ICD-10-CM | POA: Diagnosis not present

## 2021-01-05 DIAGNOSIS — D0359 Melanoma in situ of other part of trunk: Secondary | ICD-10-CM | POA: Diagnosis not present

## 2021-01-05 DIAGNOSIS — Z85828 Personal history of other malignant neoplasm of skin: Secondary | ICD-10-CM | POA: Diagnosis not present

## 2021-02-18 DIAGNOSIS — Z1152 Encounter for screening for COVID-19: Secondary | ICD-10-CM | POA: Diagnosis not present

## 2021-02-18 DIAGNOSIS — Z20822 Contact with and (suspected) exposure to covid-19: Secondary | ICD-10-CM | POA: Diagnosis not present

## 2021-02-24 ENCOUNTER — Other Ambulatory Visit: Payer: Medicare Other

## 2021-02-24 ENCOUNTER — Encounter: Payer: Self-pay | Admitting: Family Medicine

## 2021-02-24 ENCOUNTER — Ambulatory Visit (INDEPENDENT_AMBULATORY_CARE_PROVIDER_SITE_OTHER): Payer: Medicare Other | Admitting: Family Medicine

## 2021-02-24 VITALS — BP 116/68 | HR 62 | Temp 98.3°F | Resp 16 | Ht 66.0 in | Wt 128.8 lb

## 2021-02-24 DIAGNOSIS — M25512 Pain in left shoulder: Secondary | ICD-10-CM

## 2021-02-24 DIAGNOSIS — E785 Hyperlipidemia, unspecified: Secondary | ICD-10-CM

## 2021-02-24 DIAGNOSIS — E538 Deficiency of other specified B group vitamins: Secondary | ICD-10-CM | POA: Diagnosis not present

## 2021-02-24 DIAGNOSIS — I10 Essential (primary) hypertension: Secondary | ICD-10-CM

## 2021-02-24 DIAGNOSIS — M79671 Pain in right foot: Secondary | ICD-10-CM

## 2021-02-24 DIAGNOSIS — M79672 Pain in left foot: Secondary | ICD-10-CM | POA: Diagnosis not present

## 2021-02-24 DIAGNOSIS — M25511 Pain in right shoulder: Secondary | ICD-10-CM

## 2021-02-24 NOTE — Assessment & Plan Note (Signed)
Encourage heart healthy diet such as MIND or DASH diet, increase exercise, avoid trans fats, simple carbohydrates and processed foods, consider a krill or fish or flaxseed oil cap daily.  °

## 2021-02-24 NOTE — Patient Instructions (Addendum)
EWG or Environmental Working Group  Lists safe sunscreens etc.  Shingrix is the new shingles shot, 2 shots over 2-6 months, confirm coverage with insurance and document, then can return here for shots with nurse appt or at pharmacy  Shoulder Pain Many things can cause shoulder pain, including: An injury to the shoulder. Overuse of the shoulder. Arthritis. The source of the pain can be: Inflammation. An injury to the shoulder joint. An injury to a tendon, ligament, or bone. Follow these instructions at home: Pay attention to changes in your symptoms. Let your health care provider know about them. Follow these instructions to relieve your pain. If you have a sling: Wear the sling as told by your health care provider. Remove it only as told by your health care provider. Loosen the sling if your fingers tingle, become numb, or turn cold and blue. Keep the sling clean. If the sling is not waterproof: Do not let it get wet. Remove it to shower or bathe. Move your arm as little as possible, but keep your hand moving to prevent swelling. Managing pain, stiffness, and swelling  If directed, put ice on the painful area: Put ice in a plastic bag. Place a towel between your skin and the bag. Leave the ice on for 20 minutes, 2-3 times per day. Stop applying ice if it does not help with the pain. Squeeze a soft ball or a foam pad as much as possible. This helps to keep the shoulder from swelling. It also helps to strengthen the arm. General instructions Take over-the-counter and prescription medicines only as told by your health care provider. Keep all follow-up visits as told by your health care provider. This is important. Contact a health care provider if: Your pain gets worse. Your pain is not relieved with medicines. New pain develops in your arm, hand, or fingers. Get help right away if: Your arm, hand, or fingers: Tingle. Become numb. Become swollen. Become painful. Turn white or  blue. Summary Shoulder pain can be caused by an injury, overuse, or arthritis. Pay attention to changes in your symptoms. Let your health care provider know about them. This condition may be treated with a sling, ice, and pain medicines. Contact your health care provider if the pain gets worse or new pain develops. Get help right away if your arm, hand, or fingers tingle or become numb, swollen, or painful. Keep all follow-up visits as told by your health care provider. This is important. This information is not intended to replace advice given to you by your health care provider. Make sure you discuss any questions you have with your health care provider. Document Revised: 08/07/2017 Document Reviewed: 08/07/2017 Elsevier Patient Education  [place].

## 2021-02-24 NOTE — Assessment & Plan Note (Signed)
Continue to monitor

## 2021-02-24 NOTE — Assessment & Plan Note (Signed)
Well controlled, no changes to meds. Encouraged heart healthy diet such as the DASH diet and exercise as tolerated.  °

## 2021-02-25 DIAGNOSIS — M25511 Pain in right shoulder: Secondary | ICD-10-CM | POA: Insufficient documentation

## 2021-02-25 DIAGNOSIS — M25512 Pain in left shoulder: Secondary | ICD-10-CM | POA: Insufficient documentation

## 2021-02-25 NOTE — Assessment & Plan Note (Signed)
Referred to podiatry for further evaluation. She has struggled with foot pain for quite some time but it is worsening.

## 2021-02-25 NOTE — Progress Notes (Signed)
Subjective:    Patient ID: Carrie [name], female    DOB: 09/12/1945, 76 y.o.   MRN: [id number]  Chief Complaint  Patient presents with   6 months follow up     HPI Patient is in today for follow up on chronic medical concerns. No recent febrile illness or hospitalizations. Her health has been good but she is having musculoskeletal concerns. No recent injury but she did have a distant injury to her left shoulder. She has trouble with decrease ROM and pain in bilateral shoulders. She also has bilateral foot pain but her right foot is worse than her left foot. It hurts with walking. Denies CP/palp/SOB/HA/congestion/fevers/GI or GU c/o. Taking meds as prescribed   Past Medical History:  Diagnosis Date   Breast cancer ([place]) 08/2004   Left breast invasive ductal carcinoma   CHICKENPOX, HX OF 03/03/2010   COMMON MIGRAINE 03/03/2010   DEGENERATIVE DISC DISEASE 03/03/2010   Essential hypertension, benign 03/03/2010   Foot pain, bilateral 02/08/2016   HYPOKALEMIA 03/03/2010   Increased vitamin B12 level 01/10/2015   Insomnia 08/01/2016   Low back pain 08/01/2016   MEASLES, HX OF 03/03/2010   Melanoma of skin, site unspecified 03/03/2010   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   NEOPLASM, MALIGNANT, BREAST, HX OF 03/03/2010   OSTEOPENIA 03/03/2010   PALPITATIONS, HX OF 03/03/2010   Pulmonary nodules 01/07/2015   Rib fracture    left, 3 fractures s/p radiation: right side 2 fractured, no surgery    Past Surgical History:  Procedure Laterality Date   BREAST LUMPECTOMY WITH NEEDLE LOCALIZATION AND AXILLARY SENTINEL LYMPH NODE BX Left 08/2004   clavicle dislocate  76 yrs old   right, reapproximated w/pins, subsequently removed   HERNIA REPAIR  05/2012   left hip traumatic history     hematoma surgically evacuated   SHOULDER ARTHROSCOPY     b/l shoulder with debridement   TONSILLECTOMY      Family History  Problem Relation Age of Onset   Hypertension Mother    COPD Mother    Alcohol abuse Father         history of   Coronary artery disease Father    Osteoarthritis Brother        knees   Basal cell carcinoma Sister    Heart disease Maternal Grandmother    Stroke Maternal Grandfather    Osteoarthritis Sister        knees   Migraines Sister     Social History   Socioeconomic History   Marital status: Married    Spouse name: Not on file   Number of children: Not on file   Years of education: Not on file   Highest education level: Not on file  Occupational History   Not on file  Tobacco Use   Smoking status: Former    Packs/day: 0.25    Types: Cigarettes    Quit date: 02/07/1968    Years since quitting: 53.0   Smokeless tobacco: Never  Vaping Use   Vaping Use: Never used  Substance and Sexual Activity   Alcohol use: Yes    Comment: weekly   Drug use: No   Sexual activity: Not Currently    Birth control/protection: Post-menopausal  Other Topics Concern   Not on file  Social History Narrative   Not on file   Social Determinants of Health   Financial Resource Strain: Low Risk    Difficulty of Paying Living Expenses: Not hard at all  Food Insecurity: No Food  Insecurity   Worried About Charity fundraiser in the Last Year: Never true   Jeff in the Last Year: Never true  Transportation Needs: No Transportation Needs   Lack of Transportation (Medical): No   Lack of Transportation (Non-Medical): No  Physical Activity: Sufficiently Active   Days of Exercise per Week: 5 days   Minutes of Exercise per Session: 60 min  Stress: No Stress Concern Present   Feeling of Stress : Not at all  Social Connections: Socially Integrated   Frequency of Communication with Friends and Family: More than three times a week   Frequency of Social Gatherings with Friends and Family: More than three times a week   Attends Religious Services: 1 to 4 times per year   Active Member of [employer] or Organizations: Yes   Attends Archivist Meetings: 1 to 4 times per year    Marital Status: Married  Human resources officer Violence: Not At Risk   Fear of Current or Ex-Partner: No   Emotionally Abused: No   Physically Abused: No   Sexually Abused: No    Outpatient Medications Prior to Visit  Medication Sig Dispense Refill   Ascorbic Acid (VITAMIN C) 1000 MG tablet Take 1,000 mg by mouth daily.     Calcium-Vitamin D-Vitamin K (CALCIUM SOFT CHEWS PO) Take 1 tablet by mouth 2 (two) times daily.     EPINEPHrine (EPIPEN 2-PAK) 0.3 mg/0.3 mL IJ SOAJ injection Inject 0.3 mg into the muscle as needed for anaphylaxis. 1 each 2   famotidine (PEPCID) 40 MG tablet Take by mouth.     glucosamine-chondroitin 500-400 MG tablet Take 1 tablet by mouth daily.     Lactobacillus (PROBIOTIC ACIDOPHILUS PO) Take by mouth.     metoprolol succinate (TOPROL-XL) 50 MG 24 hr tablet TAKE 1 TABLET BY MOUTH DAILY. TAKE WITH OR IMMEDIATELY FOLLOWING A MEAL. 90 tablet 1   Vitamins-Lipotropics (B-50) TABS Take 1 tablet by mouth daily.     zolpidem (AMBIEN) 5 MG tablet Take 1 tablet (5 mg total) by mouth at bedtime as needed for sleep. 30 tablet 1   No facility-administered medications prior to visit.    Allergies  Allergen Reactions   Hydrocodone-Acetaminophen Other (See Comments)    Slows Respiration Rate Down   Codeine     REACTION: upsets stomach   Demerol     hives   Doxycycline Hives   Meperidine Hcl     REACTION: Hives    Review of Systems  Constitutional:  Negative for fever and malaise/fatigue.  HENT:  Negative for congestion.   Eyes:  Negative for blurred vision.  Respiratory:  Negative for shortness of breath.   Cardiovascular:  Negative for chest pain, palpitations and leg swelling.  Gastrointestinal:  Negative for abdominal pain, blood in stool and nausea.  Genitourinary:  Negative for dysuria and frequency.  Musculoskeletal:  Positive for joint pain. Negative for falls.  Skin:  Negative for rash.  Neurological:  Negative for dizziness, loss of consciousness and  headaches.  Endo/Heme/Allergies:  Negative for environmental allergies.  Psychiatric/Behavioral:  Negative for depression. The patient is not nervous/anxious.       Objective:    Physical Exam Constitutional:      General: She is not in acute distress.    Appearance: She is well-developed.  HENT:     Head: Normocephalic and atraumatic.  Eyes:     Conjunctiva/sclera: Conjunctivae normal.  Neck:     Thyroid: No thyromegaly.  Cardiovascular:  Rate and Rhythm: Normal rate and regular rhythm.     Heart sounds: Normal heart sounds. No murmur heard. Pulmonary:     Effort: Pulmonary effort is normal. No respiratory distress.     Breath sounds: Normal breath sounds.  Abdominal:     General: Bowel sounds are normal. There is no distension.     Palpations: Abdomen is soft. There is no mass.     Tenderness: There is no abdominal tenderness.  Musculoskeletal:     Cervical back: Neck supple.  Lymphadenopathy:     Cervical: No cervical adenopathy.  Skin:    General: Skin is warm and dry.  Neurological:     Mental Status: She is alert and oriented to person, place, and time.  Psychiatric:        Behavior: Behavior normal.    BP 116/68    Pulse 62    Temp 98.3 F (36.8 C)    Resp 16    Ht 5\' 6"  (1.676 m)    Wt 128 lb 12.8 oz (58.4 kg)    SpO2 95%    BMI 20.79 kg/m  Wt Readings from Last 3 Encounters:  02/24/21 128 lb 12.8 oz (58.4 kg)  08/23/20 124 lb 9.6 oz (56.5 kg)  08/17/20 126 lb (57.2 kg)    Diabetic Foot Exam - Simple   No data filed    Lab Results  Component Value Date   WBC 4.5 03/15/2020   HGB 13.7 03/15/2020   HCT 39.6 03/15/2020   PLT 298.0 03/15/2020   GLUCOSE 87 03/15/2020   CHOL 189 03/15/2020   TRIG 103.0 03/15/2020   HDL 69.30 03/15/2020   LDLDIRECT 94.8 07/28/2010   LDLCALC 99 03/15/2020   ALT 9 03/15/2020   AST 14 03/15/2020   NA 139 03/15/2020   K 3.8 03/15/2020   CL 103 03/15/2020   CREATININE 0.66 03/15/2020   BUN 15 03/15/2020   CO2 31  03/15/2020   TSH 2.72 03/15/2020   HGBA1C 5.5 09/25/2017    Lab Results  Component Value Date   TSH 2.72 03/15/2020   Lab Results  Component Value Date   WBC 4.5 03/15/2020   HGB 13.7 03/15/2020   HCT 39.6 03/15/2020   MCV 100.9 (H) 03/15/2020   PLT 298.0 03/15/2020   Lab Results  Component Value Date   NA 139 03/15/2020   K 3.8 03/15/2020   CO2 31 03/15/2020   GLUCOSE 87 03/15/2020   BUN 15 03/15/2020   CREATININE 0.66 03/15/2020   BILITOT 0.6 03/15/2020   ALKPHOS 108 03/15/2020   AST 14 03/15/2020   ALT 9 03/15/2020   PROT 6.2 03/15/2020   ALBUMIN 3.9 03/15/2020   CALCIUM 9.0 03/15/2020   ANIONGAP 13 03/03/2020   GFR 87.58 03/15/2020   Lab Results  Component Value Date   CHOL 189 03/15/2020   Lab Results  Component Value Date   HDL 69.30 03/15/2020   Lab Results  Component Value Date   LDLCALC 99 03/15/2020   Lab Results  Component Value Date   TRIG 103.0 03/15/2020   Lab Results  Component Value Date   CHOLHDL 3 03/15/2020   Lab Results  Component Value Date   HGBA1C 5.5 09/25/2017       Assessment & Plan:   Problem List Items Addressed This Visit     ESSENTIAL HYPERTENSION, BENIGN    Well controlled, no changes to meds. Encouraged heart healthy diet such as the DASH diet and exercise as tolerated.  Relevant Orders   CBC   Comprehensive metabolic panel   TSH   Bilateral foot pain - Primary    Referred to podiatry for further evaluation. She has struggled with foot pain for quite some time but it is worsening.       Relevant Orders   Ambulatory referral to Podiatry   Disorder of vitamin B12    Continue to monitor      Relevant Orders   Vitamin B12   Hyperlipidemia    Encourage heart healthy diet such as MIND or DASH diet, increase exercise, avoid trans fats, simple carbohydrates and processed foods, consider a krill or fish or flaxseed oil cap daily.       Relevant Orders   Lipid panel   Bilateral shoulder pain    She  notes bilateral shoulder pain and decrease ROM most notably in left shoulder. She did suffer an injury to that shoulder years ago. She has had surgery on both but they continue to worsen. She is referred to Sports Medicine for further evaluation and treatment.       Relevant Orders   Ambulatory referral to Sports Medicine    I am having Carrie [name] "Chris" maintain her B-50, glucosamine-chondroitin, Calcium-Vitamin D-Vitamin K (CALCIUM SOFT CHEWS PO), vitamin C, Lactobacillus (PROBIOTIC ACIDOPHILUS PO), famotidine, EPINEPHrine, zolpidem, and metoprolol succinate.  No orders of the defined types were placed in this encounter.    Penni Homans, MD

## 2021-02-25 NOTE — Assessment & Plan Note (Signed)
She notes bilateral shoulder pain and decrease ROM most notably in left shoulder. She did suffer an injury to that shoulder years ago. She has had surgery on both but they continue to worsen. She is referred to Sports Medicine for further evaluation and treatment.

## 2021-02-28 ENCOUNTER — Ambulatory Visit (INDEPENDENT_AMBULATORY_CARE_PROVIDER_SITE_OTHER): Payer: Medicare Other | Admitting: Podiatry

## 2021-02-28 ENCOUNTER — Ambulatory Visit (INDEPENDENT_AMBULATORY_CARE_PROVIDER_SITE_OTHER): Payer: Medicare Other

## 2021-02-28 ENCOUNTER — Other Ambulatory Visit: Payer: Self-pay

## 2021-02-28 DIAGNOSIS — M79671 Pain in right foot: Secondary | ICD-10-CM

## 2021-02-28 DIAGNOSIS — D3610 Benign neoplasm of peripheral nerves and autonomic nervous system, unspecified: Secondary | ICD-10-CM

## 2021-02-28 DIAGNOSIS — M79672 Pain in left foot: Secondary | ICD-10-CM | POA: Diagnosis not present

## 2021-02-28 MED ORDER — MELOXICAM 15 MG PO TABS: 15.0000 mg | ORAL_TABLET | Freq: Every day | ORAL | 0 refills | Status: DC | PRN

## 2021-02-28 NOTE — Progress Notes (Signed)
Dg  

## 2021-02-28 NOTE — Patient Instructions (Signed)
Morton Neuralgia °Morton neuralgia is foot pain that affects the ball of the foot and the area near the toes. Morton neuralgia occurs when part of a nerve in the foot (digital nerve) is under too much pressure (compressed). When this happens over a long period of time, the nerve can thicken (neuroma) and cause pain. Pain usually occurs between the third and fourth toes.  °Morton neuralgia can come and go but may get worse over time. °What are the causes? °This condition is caused by doing the same things over and over with your foot, such as: °Activities such as running or jumping. °Wearing shoes that are too tight. °What increases the risk? °You may be at higher risk for Morton neuralgia if you: °Are female. °Wear high heels. °Wear shoes that are narrow or tight. °Do activities that repeatedly stretch your toes, such as: °Running. °Ballet. °Long-distance walking. °What are the signs or symptoms? °The first symptom of Morton neuralgia is pain that spreads from the ball of the foot to the toes. It may feel like you are walking on a marble. Pain usually gets worse with walking and goes away at night. Other symptoms may include numbness and cramping of your toes. Both feet are equally affected, but rarely at the same time. °How is this diagnosed? °This condition is diagnosed based on your symptoms, your medical history, and a physical exam. Your health care provider may: °Squeeze your foot just behind your toe. °Ask you to move your toes to check for pain. °Ask about your physical activity level. °You also may have imaging tests, such as an X-ray, ultrasound, or MRI. °How is this treated? °Treatment depends on how severe your condition is and what causes it. Treatment may involve: °Wearing different shoes that are not too tight, are low-heeled, and provide good support. For some people, this is the only treatment needed. °Wearing an over-the-counter or custom supportive pad (orthotic) under the front of your  foot. °Getting injections of numbing medicine and anti-inflammatory medicine (steroid) in the nerve. °Having surgery to remove part of the thickened nerve. °Follow these instructions at home: °Managing pain, stiffness, and swelling ° °Massage your foot as needed. °Wear orthotics as told by your health care provider. °If directed, put ice on the painful area. To do this: °Put ice in a plastic bag. °Place a towel between your skin and the bag. °Leave the ice on for 20 minutes, 2-3 times a day. °Remove the ice if your skin turns bright red. This is very important. If you cannot feel pain, heat, or cold, you have a greater risk of damage to the area. °Raise (elevate) the injured area above the level of your heart while you are sitting or lying down. °Avoid activities that cause pain or make pain worse. If you play sports, ask your health care provider when it is safe for you to return to sports. °General instructions °Take over-the-counter and prescription medicines only as told by your health care provider. °For the time period you were told by your health care provider, do not drive or use machinery. °Wear shoes that: °Have soft soles. °Have a wide toe area. °Provide arch support. °Do not pinch or squeeze your feet. °Have room for your orthotics, if this applies. °Keep all follow-up visits. This is important. °Contact a health care provider if: °Your symptoms get worse or do not get better with treatment and home care. °Summary °Morton neuralgia is foot pain that affects the ball of the foot and the area   near the toes. Pain usually occurs between the third and fourth toes, gets worse with walking, and goes away at night. °Morton neuralgia occurs when part of a nerve in the foot (digital nerve) is under too much pressure. When this happens over a long period of time, the nerve can thicken (neuroma) and cause pain. °This condition is caused by doing the same things over and over with your foot, such as running or  jumping, wearing shoes that are too tight, or wearing high heels. °Treatment may involve wearing low-heeled shoes that are not too tight, wearing a supportive pad (orthotic) under the front of your foot, getting injections in the nerve, or having surgery to remove part of the thickened nerve. °This information is not intended to replace advice given to you by your health care provider. Make sure you discuss any questions you have with your health care provider. °Document Revised: 07/02/2020 Document Reviewed: 07/02/2020 °Elsevier Patient Education © 2022 [employer] ° °

## 2021-03-04 NOTE — Progress Notes (Signed)
Subjective:   Patient ID: [name], female   DOB: 76 y.o.   MRN: [id number]   HPI 76 year old female presents the office today for concerns of bilateral foot pain which started about 2 years ago.  She denies any burning, stinging sensation of the third and fourth toes and the ball of her foot as well.  She said it feels like the bones are rubbing together.  The right foot is worse than left.  Pain level 6/10.  Hurts when wearing certain shoes.  He states that she cannot walk barefoot.  No injury.   Review of Systems  All other systems reviewed and are negative.  Past Medical History:  Diagnosis Date   Breast cancer ([place]) 08/2004   Left breast invasive ductal carcinoma   CHICKENPOX, HX OF 03/03/2010   COMMON MIGRAINE 03/03/2010   DEGENERATIVE DISC DISEASE 03/03/2010   Essential hypertension, benign 03/03/2010   Foot pain, bilateral 02/08/2016   HYPOKALEMIA 03/03/2010   Increased vitamin B12 level 01/10/2015   Insomnia 08/01/2016   Low back pain 08/01/2016   MEASLES, HX OF 03/03/2010   Melanoma of skin, site unspecified 03/03/2010   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   NEOPLASM, MALIGNANT, BREAST, HX OF 03/03/2010   OSTEOPENIA 03/03/2010   PALPITATIONS, HX OF 03/03/2010   Pulmonary nodules 01/07/2015   Rib fracture    left, 3 fractures s/p radiation: right side 2 fractured, no surgery    Past Surgical History:  Procedure Laterality Date   BREAST LUMPECTOMY WITH NEEDLE LOCALIZATION AND AXILLARY SENTINEL LYMPH NODE BX Left 08/2004   clavicle dislocate  76 yrs old   right, reapproximated w/pins, subsequently removed   HERNIA REPAIR  05/2012   left hip traumatic history     hematoma surgically evacuated   SHOULDER ARTHROSCOPY     b/l shoulder with debridement   TONSILLECTOMY       Current Outpatient Medications:    meloxicam (MOBIC) 15 MG tablet, Take 1 tablet (15 mg total) by mouth daily as needed for pain., Disp: 30 tablet, Rfl: 0   Ascorbic Acid (VITAMIN C) 1000 MG tablet, Take 1,000  mg by mouth daily., Disp: , Rfl:    Calcium-Vitamin D-Vitamin K (CALCIUM SOFT CHEWS PO), Take 1 tablet by mouth 2 (two) times daily., Disp: , Rfl:    EPINEPHrine (EPIPEN 2-PAK) 0.3 mg/0.3 mL IJ SOAJ injection, Inject 0.3 mg into the muscle as needed for anaphylaxis., Disp: 1 each, Rfl: 2   famotidine (PEPCID) 40 MG tablet, Take by mouth., Disp: , Rfl:    glucosamine-chondroitin 500-400 MG tablet, Take 1 tablet by mouth daily., Disp: , Rfl:    Lactobacillus (PROBIOTIC ACIDOPHILUS PO), Take by mouth., Disp: , Rfl:    metoprolol succinate (TOPROL-XL) 50 MG 24 hr tablet, TAKE 1 TABLET BY MOUTH DAILY. TAKE WITH OR IMMEDIATELY FOLLOWING A MEAL., Disp: 90 tablet, Rfl: 1   Vitamins-Lipotropics (B-50) TABS, Take 1 tablet by mouth daily., Disp: , Rfl:    zolpidem (AMBIEN) 5 MG tablet, Take 1 tablet (5 mg total) by mouth at bedtime as needed for sleep., Disp: 30 tablet, Rfl: 1  Allergies  Allergen Reactions   Hydrocodone-Acetaminophen Other (See Comments)    Slows Respiration Rate Down   Codeine     REACTION: upsets stomach   Demerol     hives   Doxycycline Hives   Meperidine Hcl     REACTION: Hives          Objective:  Physical Exam  General: AAO x3, NAD  Dermatological: Skin is warm, dry and supple bilateral.  There are no open sores, no preulcerative lesions, no rash or signs of infection present.  Vascular: Dorsalis Pedis artery and Posterior Tibial artery pedal pulses are 2/4 bilateral with immedate capillary fill time. There is no pain with calf compression, swelling, warmth, erythema.   Neruologic: Grossly intact via light touch bilateral.  Negative Tinel sign.  Musculoskeletal: There is tenderness on the third interspaces bilaterally right side worse than left and a palpable neuroma identified.  No area pinpoint tenderness.  Trace edema.  No erythema or warmth.  Muscular strength 5/5 in all groups tested bilateral.  Gait: Unassisted, Nonantalgic.       Assessment:   Neuroma  right side worse than left     Plan:  -Treatment options discussed including all alternatives, risks, and complications -Etiology of symptoms were discussed -X-rays were obtained and reviewed with the patient.  No evidence of acute fracture or stress fracture identified today. -Offered steroid injection today.  Dispensed neuroma pads present shoes and good arch supports. -Prescribed mobic. Discussed side effects of the medication and directed to stop if any are to occur and call the office.   Trula Slade DPM

## 2021-03-09 ENCOUNTER — Encounter: Payer: Self-pay | Admitting: Family Medicine

## 2021-03-16 ENCOUNTER — Other Ambulatory Visit: Payer: Self-pay

## 2021-03-16 ENCOUNTER — Ambulatory Visit (INDEPENDENT_AMBULATORY_CARE_PROVIDER_SITE_OTHER): Payer: Medicare Other | Admitting: Family Medicine

## 2021-03-16 ENCOUNTER — Ambulatory Visit (INDEPENDENT_AMBULATORY_CARE_PROVIDER_SITE_OTHER): Payer: Medicare Other

## 2021-03-16 ENCOUNTER — Ambulatory Visit: Payer: Self-pay

## 2021-03-16 VITALS — BP 130/82 | HR 52 | Ht 66.0 in | Wt 131.0 lb

## 2021-03-16 DIAGNOSIS — M19012 Primary osteoarthritis, left shoulder: Secondary | ICD-10-CM | POA: Diagnosis not present

## 2021-03-16 DIAGNOSIS — M19011 Primary osteoarthritis, right shoulder: Secondary | ICD-10-CM | POA: Diagnosis not present

## 2021-03-16 DIAGNOSIS — M25512 Pain in left shoulder: Secondary | ICD-10-CM | POA: Diagnosis not present

## 2021-03-16 DIAGNOSIS — M25511 Pain in right shoulder: Secondary | ICD-10-CM

## 2021-03-16 DIAGNOSIS — G8929 Other chronic pain: Secondary | ICD-10-CM | POA: Diagnosis not present

## 2021-03-16 NOTE — Progress Notes (Signed)
I, Peterson Lombard, LAT, ATC acting as a scribe for Lynne Leader, MD.  Subjective:    CC: Bilat shoulder pain  HPI: Pt is a 76 y/o female c/o bilat shoulder pain, L>R x worsening over the last year. Pt notes Dr. Onnie Graham has performed bilat shoulder debridement to "clean out" the arthritis. Pt notes decrease AROM. Pt locates pain to the posterior aspect of the L shoulder and on the anterior aspect of the R. Pt also has a hx of a clavicle fx that needed surgical repair. Pt notes her last intra-articular steroid injection was very painful and made her nauseated.  She notes the injection only lasted a week or 2 and did not help very much.  Pt c/o increase pain when trying to sleep at night  Radiates: no Mechanical symptoms: no UE Numbness/tingling: no UE Weakness: yes Aggravates: any shoulder motions, esp over 90 and IR Treatments tried: ice,  Pertinent review of Systems: No fevers or chills  Relevant historical information: History of melanoma and breast cancer   Objective:    Vitals:   03/16/21 1453  BP: 130/82  Pulse: (!) 52  SpO2: 97%   General: Well Developed, well nourished, and in no acute distress.   MSK: Right shoulder normal-appearing Range of motion abduction 90 degrees external rotation 30 degrees beyond neutral position functional internal rotation to posterior iliac crest. Strength within limits of motion intact external and internal rotation.  Abduction 4/5  Left shoulder: Normal. Range of motion abduction 90 degrees.  External rotation 20 degrees beyond neutral position.  Functional internal rotation posterior iliac crest. Strength within limits of motion intact external/internal rotation.  Abduction 4/5.  Lab and Radiology Results  X-ray images bilateral shoulders obtained today personally and independently interpreted  Right shoulder: Moderate to severe glenohumeral DJD.  No acute fractures.  Left shoulder: Mild to moderate glenohumeral DJD with inferior  osteophyte from humeral head.  No acute fractures.  Await formal radiology review    Impression and Recommendations:    Assessment and Plan: 76 y.o. female with degenerative changes bilateral shoulders likely explains her pain and lack of range of motion.  She is not interested in a cortisone shot today to help manage her pain given her prior bad experience with steroid injections.  This is reasonable.  Additionally she would like to try a little bit more conservative options before proceeding with total shoulder replacement which is her ultimate definitive solution for her pain and lack of range of motion.  I think it is reasonable to try some physical therapy to see if she can regain some range of motion strength and reduce her pain.  She lives in [place] and would like to go to [facility].  Recheck back with me in 6 weeks.  If clearly not doing well can refer directly to orthopedic surgery.Marland Kitchen  PDMP not reviewed this encounter. Orders Placed This Encounter  Procedures   DG Shoulder Left    Standing Status:   Future    Number of Occurrences:   1    Standing Expiration Date:   03/16/2022    Order Specific Question:   Reason for Exam (SYMPTOM  OR DIAGNOSIS REQUIRED)    Answer:   bilateral shoulder pain    Order Specific Question:   Preferred imaging location?    Answer:   [facility]   DG Shoulder Right    Standing Status:   Future    Number of Occurrences:   1  Standing Expiration Date:   03/16/2022    Order Specific Question:   Reason for Exam (SYMPTOM  OR DIAGNOSIS REQUIRED)    Answer:   bilateral shoulder pain    Order Specific Question:   Preferred imaging location?    Answer:   [facility]   Ambulatory referral to Physical Therapy    Referral Priority:   Routine    Referral Type:   Physical Medicine    Referral Reason:   Specialty Services Required    Requested Specialty:   Physical Therapy    Number of Visits Requested:   1   No orders of the defined  types were placed in this encounter.   Discussed warning signs or symptoms. Please see discharge instructions. Patient expresses understanding.   The above documentation has been reviewed and is accurate and complete Lynne Leader, M.D.

## 2021-03-16 NOTE — Patient Instructions (Addendum)
Thank you for coming in today.   I've referred you to Physical Therapy.  Let us know if you don't hear from them in one week.   Recheck back in 6 weeks   

## 2021-03-18 NOTE — Progress Notes (Signed)
Left shoulder has lots of findings.  Arthritis is present as well as some calcified loose bodies present within the shoulder joint and perhaps in the bursa area.

## 2021-03-18 NOTE — Progress Notes (Signed)
Right shoulder x-ray shows arthritis changes and evidence of part of the collarbone being removed (that is the surgery that you had in the past)

## 2021-03-22 DIAGNOSIS — M25611 Stiffness of right shoulder, not elsewhere classified: Secondary | ICD-10-CM | POA: Diagnosis not present

## 2021-03-22 DIAGNOSIS — M25612 Stiffness of left shoulder, not elsewhere classified: Secondary | ICD-10-CM | POA: Diagnosis not present

## 2021-03-22 DIAGNOSIS — M25511 Pain in right shoulder: Secondary | ICD-10-CM | POA: Diagnosis not present

## 2021-03-22 DIAGNOSIS — M25512 Pain in left shoulder: Secondary | ICD-10-CM | POA: Diagnosis not present

## 2021-03-25 DIAGNOSIS — U071 COVID-19: Secondary | ICD-10-CM | POA: Diagnosis not present

## 2021-03-27 ENCOUNTER — Other Ambulatory Visit: Payer: Self-pay | Admitting: Podiatry

## 2021-03-31 DIAGNOSIS — M25511 Pain in right shoulder: Secondary | ICD-10-CM | POA: Diagnosis not present

## 2021-03-31 DIAGNOSIS — M25612 Stiffness of left shoulder, not elsewhere classified: Secondary | ICD-10-CM | POA: Diagnosis not present

## 2021-03-31 DIAGNOSIS — M25512 Pain in left shoulder: Secondary | ICD-10-CM | POA: Diagnosis not present

## 2021-03-31 DIAGNOSIS — M25611 Stiffness of right shoulder, not elsewhere classified: Secondary | ICD-10-CM | POA: Diagnosis not present

## 2021-04-05 DIAGNOSIS — M25512 Pain in left shoulder: Secondary | ICD-10-CM | POA: Diagnosis not present

## 2021-04-05 DIAGNOSIS — M25511 Pain in right shoulder: Secondary | ICD-10-CM | POA: Diagnosis not present

## 2021-04-05 DIAGNOSIS — M25611 Stiffness of right shoulder, not elsewhere classified: Secondary | ICD-10-CM | POA: Diagnosis not present

## 2021-04-05 DIAGNOSIS — M25612 Stiffness of left shoulder, not elsewhere classified: Secondary | ICD-10-CM | POA: Diagnosis not present

## 2021-04-06 DIAGNOSIS — C44629 Squamous cell carcinoma of skin of left upper limb, including shoulder: Secondary | ICD-10-CM | POA: Diagnosis not present

## 2021-04-06 DIAGNOSIS — Z85828 Personal history of other malignant neoplasm of skin: Secondary | ICD-10-CM | POA: Diagnosis not present

## 2021-04-06 DIAGNOSIS — D485 Neoplasm of uncertain behavior of skin: Secondary | ICD-10-CM | POA: Diagnosis not present

## 2021-04-07 DIAGNOSIS — M25612 Stiffness of left shoulder, not elsewhere classified: Secondary | ICD-10-CM | POA: Diagnosis not present

## 2021-04-07 DIAGNOSIS — M25611 Stiffness of right shoulder, not elsewhere classified: Secondary | ICD-10-CM | POA: Diagnosis not present

## 2021-04-07 DIAGNOSIS — M25512 Pain in left shoulder: Secondary | ICD-10-CM | POA: Diagnosis not present

## 2021-04-07 DIAGNOSIS — M25511 Pain in right shoulder: Secondary | ICD-10-CM | POA: Diagnosis not present

## 2021-04-11 ENCOUNTER — Ambulatory Visit (INDEPENDENT_AMBULATORY_CARE_PROVIDER_SITE_OTHER): Payer: Medicare Other | Admitting: Podiatry

## 2021-04-11 ENCOUNTER — Ambulatory Visit (INDEPENDENT_AMBULATORY_CARE_PROVIDER_SITE_OTHER): Payer: Medicare Other

## 2021-04-11 ENCOUNTER — Other Ambulatory Visit: Payer: Self-pay

## 2021-04-11 DIAGNOSIS — D3610 Benign neoplasm of peripheral nerves and autonomic nervous system, unspecified: Secondary | ICD-10-CM | POA: Diagnosis not present

## 2021-04-11 DIAGNOSIS — M79672 Pain in left foot: Secondary | ICD-10-CM

## 2021-04-11 DIAGNOSIS — M79671 Pain in right foot: Secondary | ICD-10-CM

## 2021-04-11 MED ORDER — EFINACONAZOLE 10 % EX SOLN: 1.0000 [drp] | Freq: Every day | CUTANEOUS | 11 refills | Status: DC

## 2021-04-11 NOTE — Progress Notes (Signed)
SITUATION ?Reason for Consult: Evaluation for Bilateral Custom Foot Orthoses ?Patient / Caregiver Report: Patient is ready for foot orthotics ? ?OBJECTIVE DATA: ?Patient History / Diagnosis:  ?  ICD-10-CM   ?1. Neuroma  D36.10   ?  ?2. Foot pain, bilateral  M79.671   ? M62.700   ?  ? ? ?Current or Previous Devices: 3/4 insoles made by chiropractor ages ago ? ?Foot Examination: ?Skin presentation:   Intact ?Ulcers & Callousing:   None and no history ?Toe / Foot Deformities:  Prominent met head ?Weight Bearing Presentation:  Rectus ?Sensation:    Intact ? ?ORTHOTIC RECOMMENDATION ?Recommended Device: 1x pair of custom functional foot orthotics ? ?GOALS OF ORTHOSES ?- Reduce Pain ?- Prevent Foot Deformity ?- Prevent Progression of Further Foot Deformity ?- Relieve Pressure ?- Improve the Overall Biomechanical Function of the Foot and Lower Extremity. ? ?ACTIONS PERFORMED ?Patient was casted for Foot Orthoses via crush box. Procedure was explained and patient tolerated procedure well. All questions were answered and concerns addressed. ? ?PLAN ?Potential out of pocket cost was communicated to patient. Casts are to be sent to [facility] for fabrication. Patient is to be called for fitting when devices are ready.  ? ? ?

## 2021-04-11 NOTE — Patient Instructions (Signed)
Efinaconazole Topical Solution °What is this medication? °EFINACONAZOLE (e FEE na KON a zole) is an antifungal medicine. It is used to treat certain kinds of fungal infections of the toenail. °This medicine may be used for other purposes; ask your health care provider or pharmacist if you have questions. °COMMON BRAND NAME(S): JUBLIA °What should I tell my care team before I take this medication? °They need to know if you have any of these conditions: °an unusual or allergic reaction to efinaconazole, other medicines, foods, dyes or preservatives °pregnant or trying to get pregnant °breast-feeding °How should I use this medication? °This medicine is for external use only. Do not take by mouth. Follow the directions on the label. Wash hands before and after use. Apply this medicine using the provided brush to cover the entire toenail. Do not use your medicine more often than directed. Finish the full course prescribed by your doctor or health care professional even if you think your condition is better. Do not stop using except on the advice of your doctor or health care professional. °Talk to your pediatrician regarding the use of this medicine in children. While this drug may be prescribed for children as young as 6 years for selected conditions, precautions do apply. °Overdosage: If you think you have taken too much of this medicine contact a poison control center or emergency room at once. °NOTE: This medicine is only for you. Do not share this medicine with others. °What if I miss a dose? °If you miss a dose, use it as soon as you can. If it is almost time for your next dose, use only that dose. Do not use double or extra doses. °What may interact with this medication? °Interactions have not been studied. Do not use any other nail products (i.e., nail polish, pedicures) during treatment with this medicine. °This list may not describe all possible interactions. Give your health care provider a list of all the  medicines, herbs, non-prescription drugs, or dietary supplements you use. Also tell them if you smoke, drink alcohol, or use illegal drugs. Some items may interact with your medicine. °What should I watch for while using this medication? °Do not get this medicine in your eyes. If you do, rinse out with plenty of cool tap water. °Tell your doctor or health care professional if your symptoms do not start to get better or if they get worse. °Wait for at least 10 minutes after bathing before applying this medication. After bathing, make sure that your feet are very dry. Fungal infections like moist conditions. Do not walk around barefoot. To help prevent reinfection, wear freshly washed cotton, not synthetic clothing. Tell your doctor or health care professional if you develop sores or blisters that do not heal properly. If your nail infection returns after you stop using this medicine, contact your doctor or health care professional. °What side effects may I notice from receiving this medication? °Side effects that you should report to your doctor or health care professional as soon as possible: °allergic reactions like skin rash, itching or hives, swelling of the face, lips, or tongue °ingrown toenail °Side effects that usually do not require medical attention (report to your doctor or health care professional if they continue or are bothersome): °mild skin irritation, burning, or itching °This list may not describe all possible side effects. Call your doctor for medical advice about side effects. You may report side effects to FDA at 1-800-FDA-1088. °Where should I keep my medication? °Keep out of the   reach of children. °Store at room temperature between 20 and 25 degrees C (68 and 77 degrees F). Keep this medicine in the original container. Throw away any unused medicine after the expiration date. °This medicine is flammable. Avoid exposure to heat, fire, flame, and smoking. °NOTE: This sheet is a summary. It may  not cover all possible information. If you have questions about this medicine, talk to your doctor, pharmacist, or health care provider. °© 2022 Elsevier/Gold Standard (2018-06-05 00:00:00) ° °

## 2021-04-12 ENCOUNTER — Telehealth: Payer: Self-pay

## 2021-04-12 DIAGNOSIS — M25512 Pain in left shoulder: Secondary | ICD-10-CM | POA: Diagnosis not present

## 2021-04-12 DIAGNOSIS — M25611 Stiffness of right shoulder, not elsewhere classified: Secondary | ICD-10-CM | POA: Diagnosis not present

## 2021-04-12 DIAGNOSIS — M25612 Stiffness of left shoulder, not elsewhere classified: Secondary | ICD-10-CM | POA: Diagnosis not present

## 2021-04-12 DIAGNOSIS — M25511 Pain in right shoulder: Secondary | ICD-10-CM | POA: Diagnosis not present

## 2021-04-12 NOTE — Telephone Encounter (Signed)
Casts sent to central fabrication °

## 2021-04-12 NOTE — Progress Notes (Signed)
Subjective: ?76 year old female presents the office today for evaluation of bilateral foot pain.  She states that she took meloxicam for couple weeks which help but she also use the neuroma pads.  She relates that she had orthotics at home that she has been wearing which has been helpful but they are worn out.  She states that her pain level is much improved and no significant pain at this time. ? ?Objective: ?AAO x3, NAD ?DP/PT pulses palpable bilaterally, CRT less than 3 seconds ?Unable to palpate a small neuroma present along the third interspace of the left foot but not palpable on the right foot.  There is no area pinpoint tenderness.  There is no edema, erythema.  No significant pain on exam today.  No pain with MPJ range of motion.  No other areas of tenderness.  MMT 5/5. ?No pain with calf compression, swelling, warmth, erythema ? ?Assessment: ?Bilateral neuromas ? ?Plan: ?-All treatment options discussed with the patient including all alternatives, risks, complications.  ?-Overall she is doing better.  Recommend to use meloxicam as needed.  Continue offloading pads for now.  Discussed need for inserts and she was seen by our orthotist, Aaron Edelman for new orthotics. ?-If symptoms recur or worsen discussed further treatment options including injection therapy, physical therapy or surgical excision. ?-Patient encouraged to call the office with any questions, concerns, change in symptoms.  ? ?Trula Slade DPM ? ?

## 2021-04-14 ENCOUNTER — Other Ambulatory Visit (INDEPENDENT_AMBULATORY_CARE_PROVIDER_SITE_OTHER): Payer: Medicare Other

## 2021-04-14 DIAGNOSIS — M25611 Stiffness of right shoulder, not elsewhere classified: Secondary | ICD-10-CM | POA: Diagnosis not present

## 2021-04-14 DIAGNOSIS — M25511 Pain in right shoulder: Secondary | ICD-10-CM | POA: Diagnosis not present

## 2021-04-14 DIAGNOSIS — M25512 Pain in left shoulder: Secondary | ICD-10-CM | POA: Diagnosis not present

## 2021-04-14 DIAGNOSIS — E538 Deficiency of other specified B group vitamins: Secondary | ICD-10-CM

## 2021-04-14 DIAGNOSIS — I10 Essential (primary) hypertension: Secondary | ICD-10-CM

## 2021-04-14 DIAGNOSIS — E785 Hyperlipidemia, unspecified: Secondary | ICD-10-CM | POA: Diagnosis not present

## 2021-04-14 DIAGNOSIS — M25612 Stiffness of left shoulder, not elsewhere classified: Secondary | ICD-10-CM | POA: Diagnosis not present

## 2021-04-14 LAB — COMPREHENSIVE METABOLIC PANEL
ALT: 12 U/L (ref 0–35)
AST: 17 U/L (ref 0–37)
Albumin: 4.1 g/dL (ref 3.5–5.2)
Alkaline Phosphatase: 147 U/L — ABNORMAL HIGH (ref 39–117)
BUN: 18 mg/dL (ref 6–23)
CO2: 29 mEq/L (ref 19–32)
Calcium: 8.9 mg/dL (ref 8.4–10.5)
Chloride: 105 mEq/L (ref 96–112)
Creatinine, Ser: 0.66 mg/dL (ref 0.40–1.20)
GFR: 86.91 mL/min (ref >60.00)
Glucose, Bld: 119 mg/dL — ABNORMAL HIGH (ref 70–99)
Potassium: 4.4 mEq/L (ref 3.5–5.1)
Sodium: 139 mEq/L (ref 135–145)
Total Bilirubin: 0.5 mg/dL (ref 0.2–1.2)
Total Protein: 6.3 g/dL (ref 6.0–8.3)

## 2021-04-14 LAB — LIPID PANEL
Cholesterol: 206 mg/dL — ABNORMAL HIGH (ref 0–200)
HDL: 88.1 mg/dL (ref >39.00)
LDL Cholesterol: 89 mg/dL (ref 0–99)
NonHDL: 117.69
Total CHOL/HDL Ratio: 2
Triglycerides: 142 mg/dL (ref 0.0–149.0)
VLDL: 28.4 mg/dL (ref 0.0–40.0)

## 2021-04-14 LAB — CBC
HCT: 40.1 % (ref 36.0–46.0)
Hemoglobin: 13.6 g/dL (ref 12.0–15.0)
MCHC: 34 g/dL (ref 30.0–36.0)
MCV: 99.6 fl (ref 78.0–100.0)
Platelets: 185 10*3/uL (ref 150.0–400.0)
RBC: 4.03 Mil/uL (ref 3.87–5.11)
RDW: 13.9 % (ref 11.5–15.5)
WBC: 5.3 10*3/uL (ref 4.0–10.5)

## 2021-04-14 LAB — TSH: TSH: 0.76 u[IU]/mL (ref 0.35–5.50)

## 2021-04-14 LAB — VITAMIN B12: Vitamin B-12: 564 pg/mL (ref 211–911)

## 2021-04-18 ENCOUNTER — Other Ambulatory Visit: Payer: Self-pay | Admitting: Podiatry

## 2021-04-18 MED ORDER — TAVABOROLE 5 % EX SOLN: 1.0000 [drp] | CUTANEOUS | 2 refills | Status: DC

## 2021-04-18 NOTE — Progress Notes (Signed)
Jublia not covered- patient OK with Kerydin. ?

## 2021-04-19 DIAGNOSIS — M25512 Pain in left shoulder: Secondary | ICD-10-CM | POA: Diagnosis not present

## 2021-04-19 DIAGNOSIS — M25612 Stiffness of left shoulder, not elsewhere classified: Secondary | ICD-10-CM | POA: Diagnosis not present

## 2021-04-19 DIAGNOSIS — M25511 Pain in right shoulder: Secondary | ICD-10-CM | POA: Diagnosis not present

## 2021-04-19 DIAGNOSIS — M25611 Stiffness of right shoulder, not elsewhere classified: Secondary | ICD-10-CM | POA: Diagnosis not present

## 2021-04-21 DIAGNOSIS — M25511 Pain in right shoulder: Secondary | ICD-10-CM | POA: Diagnosis not present

## 2021-04-21 DIAGNOSIS — M25612 Stiffness of left shoulder, not elsewhere classified: Secondary | ICD-10-CM | POA: Diagnosis not present

## 2021-04-21 DIAGNOSIS — M25512 Pain in left shoulder: Secondary | ICD-10-CM | POA: Diagnosis not present

## 2021-04-21 DIAGNOSIS — M25611 Stiffness of right shoulder, not elsewhere classified: Secondary | ICD-10-CM | POA: Diagnosis not present

## 2021-04-22 ENCOUNTER — Other Ambulatory Visit: Payer: Self-pay

## 2021-04-22 DIAGNOSIS — R899 Unspecified abnormal finding in specimens from other organs, systems and tissues: Secondary | ICD-10-CM

## 2021-04-22 DIAGNOSIS — R748 Abnormal levels of other serum enzymes: Secondary | ICD-10-CM

## 2021-04-22 DIAGNOSIS — E785 Hyperlipidemia, unspecified: Secondary | ICD-10-CM

## 2021-04-22 DIAGNOSIS — R945 Abnormal results of liver function studies: Secondary | ICD-10-CM

## 2021-04-22 DIAGNOSIS — Z20822 Contact with and (suspected) exposure to covid-19: Secondary | ICD-10-CM | POA: Diagnosis not present

## 2021-04-25 DIAGNOSIS — Z20822 Contact with and (suspected) exposure to covid-19: Secondary | ICD-10-CM | POA: Diagnosis not present

## 2021-04-26 DIAGNOSIS — Z1211 Encounter for screening for malignant neoplasm of colon: Secondary | ICD-10-CM | POA: Diagnosis not present

## 2021-04-26 DIAGNOSIS — K648 Other hemorrhoids: Secondary | ICD-10-CM | POA: Diagnosis not present

## 2021-04-26 DIAGNOSIS — K5730 Diverticulosis of large intestine without perforation or abscess without bleeding: Secondary | ICD-10-CM | POA: Diagnosis not present

## 2021-04-26 LAB — HM COLONOSCOPY

## 2021-04-27 ENCOUNTER — Encounter: Payer: Self-pay | Admitting: Family Medicine

## 2021-04-27 ENCOUNTER — Other Ambulatory Visit: Payer: Self-pay

## 2021-04-27 ENCOUNTER — Ambulatory Visit (INDEPENDENT_AMBULATORY_CARE_PROVIDER_SITE_OTHER): Payer: Medicare Other | Admitting: Family Medicine

## 2021-04-27 VITALS — BP 108/68 | HR 52 | Ht 66.0 in | Wt 125.0 lb

## 2021-04-27 DIAGNOSIS — M19012 Primary osteoarthritis, left shoulder: Secondary | ICD-10-CM | POA: Diagnosis not present

## 2021-04-27 DIAGNOSIS — M19011 Primary osteoarthritis, right shoulder: Secondary | ICD-10-CM

## 2021-04-27 NOTE — Patient Instructions (Addendum)
Good to see you  ?Referral to orthocare at [place] placed ?Follow up as needed  ?

## 2021-04-27 NOTE — Progress Notes (Signed)
? ?I, Judy Pimple, am serving as a Education administrator for Dr. Lynne Leader. ? ?Carrie [name] is a 76 y.o. female who presents to [place] at [facility] today for f/u bilat shoulder pain, L>R. Pt was uninterested in steroid injections due to prior bad experiences. Pt was last seen by Dr. Georgina Snell on 03/16/21 and she was referred to [facility]. Today, pt reports the shoulders are better as far as movement goes. Patient was able to get into PT and has had some improvement with her 8 sessions of PT. Aleatha Borer needs a signature for 4 more weeks of PT. ? ?She is feeling better but thinks that she is probably getting need a shoulder replacement at some point in the not so distant future and is interested in talking with surgeons about her timing and options. ? ?Dx imaging: 03/16/21 R & L shoulder XR ? 06/18/09 L shoulder XR ? ?Pertinent review of systems: No fevers or chills ? ?Relevant historical information: History melanoma ? ? ?Exam:  ?BP 108/68   Pulse (!) 52   Ht '5\' 6"'$  (1.676 m)   Wt 125 lb (56.7 kg)   SpO2 98%   BMI 20.18 kg/m?  ?General: Well Developed, well nourished, and in no acute distress.  ? ?MSK:  ?Right shoulder: Range of motion abduction 130 degrees.  Functional internal rotation lumbar spine.  External rotation full. ?Strength intact. ?Left shoulder: Range of motion 130 degrees.  Functional internal rotation lumbar spine.  External rotation full. ?Strength intact. ? ? ? ?Lab and Radiology Results ? ? ?EXAM: ?LEFT SHOULDER - 2+ VIEW; RIGHT SHOULDER - 2+ VIEW ?  ?COMPARISON:  Old left shoulder series from 06/18/2009. No prior ?right shoulder series. ?  ?FINDINGS: ?Left shoulder: ?  ?Osteopenia. No evidence of fracture or dislocation. There are ?increasingly prominent osteophytes at the inferior glenohumeral ?joint primarily along humeral side, with partial inferior joint ?space loss. Slight spurring has developed at the [facility] joint. ?  ?Small calcified loose bodies are again noted along the  proximal ?biceps tendon sheath, and a 1 cm calcified loose body has developed ?in the area of the subacromial bursal space. ?  ?There is aortic atherosclerosis. Visualized left upper lung field is ?clear. ?  ?Right shoulder: ?  ?No previous. There is evidence of prior resection of the ?periarticular distal clavicle. There is joint space loss and ?moderate inferior spurring about both sides of the glenohumeral ?joint. ?  ?There is osteopenia without evidence of fractures or dislocation. No ?loose bodies are observed. ?  ?IMPRESSION: ?1. Osteopenia and degenerative change without evidence of fractures. ?2. Calcified loose bodies in the biceps tendon sheath on the left, ?and a 1 cm calcified loose body in the area of the left subacromial ?bursa. ?3. Early degenerative spurring at the left [facility] joint; evidence of ?remote resection of the distal right clavicle. ?  ?  ?Electronically Signed ?  By: Telford Nab M.D. ?  On: 03/18/2021 03:04 ? ? ?Assessment and Plan: ?76 y.o. female with bilateral shoulder pain due to DJD.  She has improved quite a bit with physical therapy but still is having daily pain.  I think qualifies to have a discussion with orthopedic surgery about all the details of shoulder placement.  She can talk about timing and at least get a sense for when it would make sense for her.  I think she is probably going to need a shoulder replacement within the next year maybe sooner. ? ?Total encounter time  20 minutes including face-to-face time with the patient and, reviewing past medical record, and charting on the date of service.   ?Discussion treatment plan and options ?PDMP not reviewed this encounter. ?Orders Placed This Encounter  ?Procedures  ? Ambulatory referral to Orthopedic Surgery  ?  Referral Priority:   Routine  ?  Referral Type:   Surgical  ?  Referral Reason:   Specialty Services Required  ?  Referred to Provider:   Vanetta Mulders, MD  ?  Requested Specialty:   Orthopedic Surgery  ?  Number of  Visits Requested:   1  ? ?No orders of the defined types were placed in this encounter. ? ? ? ?Discussed warning signs or symptoms. Please see discharge instructions. Patient expresses understanding. ? ? ?The above documentation has been reviewed and is accurate and complete Lynne Leader, M.D. ? ? ?

## 2021-04-28 ENCOUNTER — Encounter: Payer: Self-pay | Admitting: Family Medicine

## 2021-04-28 ENCOUNTER — Other Ambulatory Visit (INDEPENDENT_AMBULATORY_CARE_PROVIDER_SITE_OTHER): Payer: Medicare Other

## 2021-04-28 DIAGNOSIS — R899 Unspecified abnormal finding in specimens from other organs, systems and tissues: Secondary | ICD-10-CM | POA: Diagnosis not present

## 2021-04-28 DIAGNOSIS — M25512 Pain in left shoulder: Secondary | ICD-10-CM | POA: Diagnosis not present

## 2021-04-28 DIAGNOSIS — M25612 Stiffness of left shoulder, not elsewhere classified: Secondary | ICD-10-CM | POA: Diagnosis not present

## 2021-04-28 DIAGNOSIS — R748 Abnormal levels of other serum enzymes: Secondary | ICD-10-CM | POA: Diagnosis not present

## 2021-04-28 DIAGNOSIS — M25611 Stiffness of right shoulder, not elsewhere classified: Secondary | ICD-10-CM | POA: Diagnosis not present

## 2021-04-28 DIAGNOSIS — M25511 Pain in right shoulder: Secondary | ICD-10-CM | POA: Diagnosis not present

## 2021-04-28 DIAGNOSIS — E785 Hyperlipidemia, unspecified: Secondary | ICD-10-CM

## 2021-04-28 DIAGNOSIS — R945 Abnormal results of liver function studies: Secondary | ICD-10-CM | POA: Diagnosis not present

## 2021-04-28 LAB — HEPATIC FUNCTION PANEL
ALT: 12 U/L (ref 0–35)
AST: 17 U/L (ref 0–37)
Albumin: 4.3 g/dL (ref 3.5–5.2)
Alkaline Phosphatase: 178 U/L — ABNORMAL HIGH (ref 39–117)
Bilirubin, Direct: 0.1 mg/dL (ref 0.0–0.3)
Total Bilirubin: 0.6 mg/dL (ref 0.2–1.2)
Total Protein: 6.3 g/dL (ref 6.0–8.3)

## 2021-04-28 LAB — GAMMA GT: GGT: 11 U/L (ref 7–51)

## 2021-04-29 ENCOUNTER — Encounter: Payer: Self-pay | Admitting: Family Medicine

## 2021-04-29 ENCOUNTER — Ambulatory Visit (INDEPENDENT_AMBULATORY_CARE_PROVIDER_SITE_OTHER): Payer: Medicare Other | Admitting: Orthopaedic Surgery

## 2021-04-29 ENCOUNTER — Other Ambulatory Visit: Payer: Self-pay

## 2021-04-29 DIAGNOSIS — M25511 Pain in right shoulder: Secondary | ICD-10-CM | POA: Diagnosis not present

## 2021-04-29 DIAGNOSIS — M19012 Primary osteoarthritis, left shoulder: Secondary | ICD-10-CM

## 2021-04-29 LAB — HEPATIC FUNCTION PANEL: Alkaline Phosphatase: 204 — AB (ref 25–125)

## 2021-04-29 MED ORDER — TRIAMCINOLONE ACETONIDE 40 MG/ML IJ SUSP
80.0000 mg | INTRAMUSCULAR | Status: AC | PRN
  Administered 2021-04-29: 80 mg via INTRA_ARTICULAR

## 2021-04-29 MED ORDER — LIDOCAINE HCL 1 % IJ SOLN
4.0000 mL | INTRAMUSCULAR | Status: AC | PRN
  Administered 2021-04-29: 4 mL

## 2021-04-29 NOTE — Progress Notes (Signed)
? ?                            ? ? ?Chief Complaint: Left worsening right shoulder pain ?  ? ? ?History of Present Illness:  ? ? ?Carrie [name] is a 76 y.o. female right-hand-dominant female presents with left wrist and right shoulder pain which has been ongoing for several years now.  She does previously state that she has had bilateral arthroscopic shoulder debridement done nearly a decade prior by Dr. Onnie Graham.  This gave her some relief for a series of years but overall the pain is recurred.  It is worse on the left than the right.  She is currently been doing 4 weeks of physical therapy which does seem to help her mobility as well as pain.  Her pain is predominantly occurring when she is more active and doing things like planks or push-ups.  She has previously had injections in the bilateral shoulders again approximately 10 years prior with Dr. Arville Go.  She is somewhat hesitant to get these as these were significantly painful for her.  She takes naproxen for the pain although this is mildly helpful.  She is here today as a referral from Dr. Georgina Snell for discussion of additional options.  She is overall very active and goes to the gym frequently.  She states that this is what is bothersome to her the most.  Denies any significant limitations of motion.  She is able to sleep at night without much additional pain ? ? ? ?Surgical History:   ?None ? ?PMH/PSH/Family History/Social History/Meds/Allergies:   ? ?Past Medical History:  ?Diagnosis Date  ?? Breast cancer ([place]) 08/2004  ? Left breast invasive ductal carcinoma  ?? CHICKENPOX, HX OF 03/03/2010  ?? COMMON MIGRAINE 03/03/2010  ?? DEGENERATIVE DISC DISEASE 03/03/2010  ?? Essential hypertension, benign 03/03/2010  ?? Foot pain, bilateral 02/08/2016  ?? HYPOKALEMIA 03/03/2010  ?? Increased vitamin B12 level 01/10/2015  ?? Insomnia 08/01/2016  ?? Low back pain 08/01/2016  ?? MEASLES, HX OF 03/03/2010  ?? Melanoma of skin, site unspecified 03/03/2010  ?? 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 NEOPLASM, MALIGNANT, BREAST, HX OF 03/03/2010  ?? OSTEOPENIA 03/03/2010  ?? PALPITATIONS, HX OF 03/03/2010  ?? Pulmonary nodules 01/07/2015  ?? Rib fracture   ? left, 3 fractures s/p radiation: right side 2 fractured, no surgery  ? ?Past Surgical History:  ?Procedure Laterality Date  ?? BREAST LUMPECTOMY WITH NEEDLE LOCALIZATION AND AXILLARY SENTINEL LYMPH NODE BX Left 08/2004  ?? clavicle dislocate  76 yrs old  ? right, reapproximated w/pins, subsequently removed  ?? HERNIA REPAIR  05/2012  ?? left hip traumatic history    ? hematoma surgically evacuated  ?? SHOULDER ARTHROSCOPY    ? b/l shoulder with debridement  ?? TONSILLECTOMY    ? ?Social History  ? ?Socioeconomic History  ?? Marital status: Married  ?  Spouse name: Not on file  ?? Number of children: Not on file  ?? Years of education: Not on file  ?? Highest education level: Not on file  ?Occupational History  ?? Not on file  ?Tobacco Use  ?? Smoking status: Former  ?  Packs/day: 0.25  ?  Types: Cigarettes  ?  Quit date: 02/07/1968  ?  Years since quitting: 53.2  ?? Smokeless tobacco: Never  ?Vaping Use  ?? Vaping Use: Never used  ?Substance and Sexual Activity  ?? Alcohol use: Yes  ?  Comment:  weekly  ?? Drug use: No  ?? Sexual activity: Not Currently  ?  Birth control/protection: Post-menopausal  ?Other Topics Concern  ?? Not on file  ?Social History Narrative  ?? Not on file  ? ?Social Determinants of Health  ? ?Financial Resource Strain: Low Risk   ?? Difficulty of Paying Living Expenses: Not hard at all  ?Food Insecurity: No Food Insecurity  ?? Worried About Charity fundraiser in the Last Year: Never true  ?? Ran Out of Food in the Last Year: Never true  ?Transportation Needs: No Transportation Needs  ?? Lack of Transportation (Medical): No  ?? Lack of Transportation (Non-Medical): No  ?Physical Activity: Sufficiently Active  ?? Days of Exercise per Week: 5 days  ?? Minutes of Exercise per Session: 60 min  ?Stress: No Stress Concern Present  ??  Feeling of Stress : Not at all  ?Social Connections: Socially Integrated  ?? Frequency of Communication with Friends and Family: More than three times a week  ?? Frequency of Social Gatherings with Friends and Family: More than three times a week  ?? Attends Religious Services: 1 to 4 times per year  ?? Active Member of Clubs or Organizations: Yes  ?? Attends Archivist Meetings: 1 to 4 times per year  ?? Marital Status: Married  ? ?Family History  ?Problem Relation Age of Onset  ?? Hypertension Mother   ?? COPD Mother   ?? Alcohol abuse Father   ?     history of  ?? Coronary artery disease Father   ?? Osteoarthritis Brother   ?     knees  ?? Basal cell carcinoma Sister   ?? Heart disease Maternal Grandmother   ?? Stroke Maternal Grandfather   ?? Osteoarthritis Sister   ?     knees  ?? Migraines Sister   ? ?Allergies  ?Allergen Reactions  ?? Hydrocodone-Acetaminophen Other (See Comments)  ?  Slows Respiration Rate Down  ?? Codeine   ?  REACTION: upsets stomach  ?? Demerol   ?  hives  ?? Doxycycline Hives  ?? Meperidine Hcl   ?  REACTION: Hives  ? ?Current Outpatient Medications  ?Medication Sig Dispense Refill  ?? Ascorbic Acid (VITAMIN C) 1000 MG tablet Take 1,000 mg by mouth daily.    ?? Calcium-Vitamin D-Vitamin K (CALCIUM SOFT CHEWS PO) Take 1 tablet by mouth 2 (two) times daily.    ?? Efinaconazole 10 % SOLN Apply 1 drop topically daily. 4 mL 11  ?? EPINEPHrine (EPIPEN 2-PAK) 0.3 mg/0.3 mL IJ SOAJ injection Inject 0.3 mg into the muscle as needed for anaphylaxis. 1 each 2  ?? famotidine (PEPCID) 40 MG tablet Take by mouth.    ?? glucosamine-chondroitin 500-400 MG tablet Take 1 tablet by mouth daily.    ?? Lactobacillus (PROBIOTIC ACIDOPHILUS PO) Take by mouth.    ?? meloxicam (MOBIC) 15 MG tablet TAKE 1 TABLET BY MOUTH EVERY DAY AS NEEDED FOR PAIN 30 tablet 0  ?? metoprolol succinate (TOPROL-XL) 50 MG 24 hr tablet TAKE 1 TABLET BY MOUTH DAILY. TAKE WITH OR IMMEDIATELY FOLLOWING A MEAL. 90 tablet 1  ??  Tavaborole (KERYDIN) 5 % SOLN Apply 1 drop topically 1 day or 1 dose. Apply 1 drop to the toenail daily. 10 mL 2  ?? Vitamins-Lipotropics (B-50) TABS Take 1 tablet by mouth daily.    ?? zolpidem (AMBIEN) 5 MG tablet Take 1 tablet (5 mg total) by mouth at bedtime as needed for sleep. 30 tablet 1  ? ?  No current facility-administered medications for this visit.  ? ?No results found. ? ?Review of Systems:   ?A ROS was performed including pertinent positives and negatives as documented in the HPI. ? ?Physical Exam :   ?Constitutional: NAD and appears stated age ?Neurological: Alert and oriented ?Psych: Appropriate affect and cooperative ?There were no vitals taken for this visit.  ? ?Comprehensive Musculoskeletal Exam:   ? ?Musculoskeletal Exam    ?Inspection Right Left  ?Skin No atrophy or winging No atrophy or winging  ?Palpation    ?Tenderness Glenohumeral lateral deltoid Glenohumeral lateral deltoid  ?Range of Motion    ?Flexion (passive) 170 170  ?Flexion (active) 170 170  ?Abduction 170 170  ?ER at the side 70 50  ?Can reach behind back to L5 Sacrum  ?Strength    ? Full Full with pain negative belly press  ?Special Tests    ?Pseudoparalytic No No  ?Neurologic    ?Fires PIN, radial, median, ulnar, musculocutaneous, axillary, suprascapular, long thoracic, and spinal accessory innervated muscles. No abnormal sensibility  ?Vascular/Lymphatic    ?Radial Pulse 2+ 2+  ?Cervical Exam    ?Patient has symmetric cervical range of motion with negative Spurling's test.  ?Special Test:   ? ? ? ?Imaging:   ?Xray (3 views right shoulder, 3 views left shoulder): ?She has moderate osteoarthritis involving the left worse than the right shoulder ? ? ?I personally reviewed and interpreted the radiographs. ? ? ?Assessment:   ?76 year old female with left worse than right shoulder glenohumeral.  Overall her motion is still quite good and she does have very minimal pain at sleeping.  I described that she is likely having additional pain  given the fact that she is very active.  She continues to experience pain with activities such as planking.  Overall I described that given the fact that she is quite functional, I do not believe it would

## 2021-05-01 LAB — ALKALINE PHOSPHATASE, ISOENZYMES
Alkaline Phosphatase: 204 IU/L — ABNORMAL HIGH (ref 44–121)
BONE FRACTION: 42 % (ref 14–68)
INTESTINAL FRAC.: 40 % — ABNORMAL HIGH (ref 0–18)
LIVER FRACTION: 18 % (ref 18–85)

## 2021-05-01 LAB — SPECIMEN STATUS REPORT

## 2021-05-02 ENCOUNTER — Other Ambulatory Visit: Payer: Self-pay

## 2021-05-02 DIAGNOSIS — R748 Abnormal levels of other serum enzymes: Secondary | ICD-10-CM

## 2021-05-10 DIAGNOSIS — M25612 Stiffness of left shoulder, not elsewhere classified: Secondary | ICD-10-CM | POA: Diagnosis not present

## 2021-05-10 DIAGNOSIS — M25611 Stiffness of right shoulder, not elsewhere classified: Secondary | ICD-10-CM | POA: Diagnosis not present

## 2021-05-10 DIAGNOSIS — M25511 Pain in right shoulder: Secondary | ICD-10-CM | POA: Diagnosis not present

## 2021-05-10 DIAGNOSIS — M25512 Pain in left shoulder: Secondary | ICD-10-CM | POA: Diagnosis not present

## 2021-05-11 ENCOUNTER — Other Ambulatory Visit: Payer: Medicare Other

## 2021-05-16 DIAGNOSIS — Z20822 Contact with and (suspected) exposure to covid-19: Secondary | ICD-10-CM | POA: Diagnosis not present

## 2021-05-18 ENCOUNTER — Other Ambulatory Visit: Payer: Medicare Other

## 2021-05-20 ENCOUNTER — Other Ambulatory Visit: Payer: Self-pay | Admitting: Family Medicine

## 2021-05-25 ENCOUNTER — Ambulatory Visit: Payer: Medicare Other

## 2021-05-25 DIAGNOSIS — M79671 Pain in right foot: Secondary | ICD-10-CM

## 2021-05-25 DIAGNOSIS — D3610 Benign neoplasm of peripheral nerves and autonomic nervous system, unspecified: Secondary | ICD-10-CM

## 2021-05-25 NOTE — Progress Notes (Signed)
SITUATION: ?Reason for Visit: Fitting and Delivery of Custom Fabricated Foot Orthoses ?Patient Report: Patient reports comfort and is satisfied with device. ? ?OBJECTIVE DATA: ?Patient History / Diagnosis:   ?  ICD-10-CM   ?1. Neuroma  D36.10   ?  ?2. Foot pain, bilateral  M79.671   ? Z85.885   ?  ? ? ?Provided Device:  Custom Functional Foot Orthotics ?    [facility]icheyLAB: [id number] ? ?GOAL OF ORTHOSIS ?- Improve gait ?- Decrease energy expenditure ?- Improve Balance ?- Provide Triplanar stability of foot complex ?- Facilitate motion ? ?ACTIONS PERFORMED ?Patient was fit with foot orthotics trimmed to shoe last. Patient tolerated fittign procedure.  ? ?Patient was provided with verbal and written instruction and demonstration regarding donning, doffing, wear, care, proper fit, function, purpose, cleaning, and use of the orthosis and in all related precautions and risks and benefits regarding the orthosis. ? ?Patient was also provided with verbal instruction regarding how to report any failures or malfunctions of the orthosis and necessary follow up care. Patient was also instructed to contact our office regarding any change in status that may affect the function of the orthosis. ? ?Patient demonstrated independence with proper donning, doffing, and fit and verbalized understanding of all instructions. ? ?PLAN: ?Patient is to follow up in one week or as necessary (PRN). All questions were answered and concerns addressed. Plan of care was discussed with and agreed upon by the patient. ? ?

## 2021-05-30 DIAGNOSIS — R102 Pelvic and perineal pain: Secondary | ICD-10-CM | POA: Diagnosis not present

## 2021-05-30 DIAGNOSIS — R748 Abnormal levels of other serum enzymes: Secondary | ICD-10-CM | POA: Diagnosis not present

## 2021-05-30 DIAGNOSIS — R1032 Left lower quadrant pain: Secondary | ICD-10-CM | POA: Diagnosis not present

## 2021-05-31 DIAGNOSIS — M25512 Pain in left shoulder: Secondary | ICD-10-CM | POA: Diagnosis not present

## 2021-05-31 DIAGNOSIS — M25511 Pain in right shoulder: Secondary | ICD-10-CM | POA: Diagnosis not present

## 2021-05-31 DIAGNOSIS — M25612 Stiffness of left shoulder, not elsewhere classified: Secondary | ICD-10-CM | POA: Diagnosis not present

## 2021-05-31 DIAGNOSIS — M25611 Stiffness of right shoulder, not elsewhere classified: Secondary | ICD-10-CM | POA: Diagnosis not present

## 2021-06-06 DIAGNOSIS — Z20822 Contact with and (suspected) exposure to covid-19: Secondary | ICD-10-CM | POA: Diagnosis not present

## 2021-06-09 DIAGNOSIS — M25511 Pain in right shoulder: Secondary | ICD-10-CM | POA: Diagnosis not present

## 2021-06-09 DIAGNOSIS — M25612 Stiffness of left shoulder, not elsewhere classified: Secondary | ICD-10-CM | POA: Diagnosis not present

## 2021-06-09 DIAGNOSIS — M25611 Stiffness of right shoulder, not elsewhere classified: Secondary | ICD-10-CM | POA: Diagnosis not present

## 2021-06-09 DIAGNOSIS — M25512 Pain in left shoulder: Secondary | ICD-10-CM | POA: Diagnosis not present

## 2021-06-16 DIAGNOSIS — M25512 Pain in left shoulder: Secondary | ICD-10-CM | POA: Diagnosis not present

## 2021-06-16 DIAGNOSIS — M25611 Stiffness of right shoulder, not elsewhere classified: Secondary | ICD-10-CM | POA: Diagnosis not present

## 2021-06-16 DIAGNOSIS — M25612 Stiffness of left shoulder, not elsewhere classified: Secondary | ICD-10-CM | POA: Diagnosis not present

## 2021-06-16 DIAGNOSIS — M25511 Pain in right shoulder: Secondary | ICD-10-CM | POA: Diagnosis not present

## 2021-06-17 DIAGNOSIS — R1032 Left lower quadrant pain: Secondary | ICD-10-CM | POA: Diagnosis not present

## 2021-06-20 ENCOUNTER — Other Ambulatory Visit: Payer: Self-pay

## 2021-06-21 DIAGNOSIS — M25512 Pain in left shoulder: Secondary | ICD-10-CM | POA: Diagnosis not present

## 2021-06-21 DIAGNOSIS — M25611 Stiffness of right shoulder, not elsewhere classified: Secondary | ICD-10-CM | POA: Diagnosis not present

## 2021-06-21 DIAGNOSIS — M25612 Stiffness of left shoulder, not elsewhere classified: Secondary | ICD-10-CM | POA: Diagnosis not present

## 2021-06-21 DIAGNOSIS — M25511 Pain in right shoulder: Secondary | ICD-10-CM | POA: Diagnosis not present

## 2021-06-23 DIAGNOSIS — Z8262 Family history of osteoporosis: Secondary | ICD-10-CM | POA: Diagnosis not present

## 2021-06-23 DIAGNOSIS — Z6821 Body mass index (BMI) 21.0-21.9, adult: Secondary | ICD-10-CM | POA: Diagnosis not present

## 2021-06-23 DIAGNOSIS — Z1231 Encounter for screening mammogram for malignant neoplasm of breast: Secondary | ICD-10-CM | POA: Diagnosis not present

## 2021-06-23 DIAGNOSIS — M8588 Other specified disorders of bone density and structure, other site: Secondary | ICD-10-CM | POA: Diagnosis not present

## 2021-06-23 DIAGNOSIS — N958 Other specified menopausal and perimenopausal disorders: Secondary | ICD-10-CM | POA: Diagnosis not present

## 2021-06-23 DIAGNOSIS — Z01419 Encounter for gynecological examination (general) (routine) without abnormal findings: Secondary | ICD-10-CM | POA: Diagnosis not present

## 2021-06-23 LAB — HM MAMMOGRAPHY: HM Mammogram: NORMAL (ref 0–4)

## 2021-06-23 LAB — HM DEXA SCAN

## 2021-06-28 LAB — HM DEXA SCAN

## 2021-07-05 ENCOUNTER — Ambulatory Visit: Payer: Medicare Other

## 2021-07-05 DIAGNOSIS — D3610 Benign neoplasm of peripheral nerves and autonomic nervous system, unspecified: Secondary | ICD-10-CM

## 2021-07-05 NOTE — Progress Notes (Signed)
SITUATION Reason for Consult: Follow-up with foot orthotics Patient / Caregiver Report: Patient reports pain in proximal medial longitudinal arch  OBJECTIVE DATA History / Diagnosis:    ICD-10-CM   1. Neuroma  D36.10       Change in Pathology: None  ACTIONS PERFORMED Patient's equipment was checked for structural stability and fit. Reduced arch thickness at heel area. Device(s) intact and fit is excellent. All questions answered and concerns addressed.  PLAN Follow-up as needed (PRN). Plan of care discussed with and agreed upon by patient / caregiver.

## 2021-07-20 ENCOUNTER — Encounter: Payer: Self-pay | Admitting: Family Medicine

## 2021-07-20 DIAGNOSIS — D0439 Carcinoma in situ of skin of other parts of face: Secondary | ICD-10-CM | POA: Diagnosis not present

## 2021-07-20 DIAGNOSIS — C44722 Squamous cell carcinoma of skin of right lower limb, including hip: Secondary | ICD-10-CM | POA: Diagnosis not present

## 2021-07-20 DIAGNOSIS — L821 Other seborrheic keratosis: Secondary | ICD-10-CM | POA: Diagnosis not present

## 2021-07-20 DIAGNOSIS — L570 Actinic keratosis: Secondary | ICD-10-CM | POA: Diagnosis not present

## 2021-07-20 DIAGNOSIS — Z85828 Personal history of other malignant neoplasm of skin: Secondary | ICD-10-CM | POA: Diagnosis not present

## 2021-07-20 DIAGNOSIS — C44729 Squamous cell carcinoma of skin of left lower limb, including hip: Secondary | ICD-10-CM | POA: Diagnosis not present

## 2021-07-20 DIAGNOSIS — Z85820 Personal history of malignant melanoma of skin: Secondary | ICD-10-CM | POA: Diagnosis not present

## 2021-07-20 DIAGNOSIS — D225 Melanocytic nevi of trunk: Secondary | ICD-10-CM | POA: Diagnosis not present

## 2021-07-20 DIAGNOSIS — D2261 Melanocytic nevi of right upper limb, including shoulder: Secondary | ICD-10-CM | POA: Diagnosis not present

## 2021-07-20 DIAGNOSIS — D2262 Melanocytic nevi of left upper limb, including shoulder: Secondary | ICD-10-CM | POA: Diagnosis not present

## 2021-07-20 DIAGNOSIS — L565 Disseminated superficial actinic porokeratosis (DSAP): Secondary | ICD-10-CM | POA: Diagnosis not present

## 2021-07-20 DIAGNOSIS — D485 Neoplasm of uncertain behavior of skin: Secondary | ICD-10-CM | POA: Diagnosis not present

## 2021-07-29 ENCOUNTER — Ambulatory Visit (HOSPITAL_BASED_OUTPATIENT_CLINIC_OR_DEPARTMENT_OTHER): Payer: Medicare Other | Admitting: Orthopaedic Surgery

## 2021-08-01 DIAGNOSIS — R748 Abnormal levels of other serum enzymes: Secondary | ICD-10-CM | POA: Diagnosis not present

## 2021-08-03 ENCOUNTER — Ambulatory Visit (INDEPENDENT_AMBULATORY_CARE_PROVIDER_SITE_OTHER): Payer: Medicare Other | Admitting: Orthopaedic Surgery

## 2021-08-03 DIAGNOSIS — M19012 Primary osteoarthritis, left shoulder: Secondary | ICD-10-CM | POA: Diagnosis not present

## 2021-08-03 NOTE — Progress Notes (Signed)
Chief Complaint: Left worsening right shoulder pain     History of Present Illness:   08/03/2021: Presents today for follow-up of the left shoulder.  Overall her motion is much better.  She feels much better after ultrasound-guided injection.  She is very happy with this.  She is experiencing some pain about the lateral aspect of the elbow rating into the forearm.  Carrie [name] is a 76 y.o. female right-hand-dominant female presents with left wrist and right shoulder pain which has been ongoing for several years now.  She does previously state that she has had bilateral arthroscopic shoulder debridement done nearly a decade prior by Dr. Onnie Graham.  This gave her some relief for a series of years but overall the pain is recurred.  It is worse on the left than the right.  She is currently been doing 4 weeks of physical therapy which does seem to help her mobility as well as pain.  Her pain is predominantly occurring when she is more active and doing things like planks or push-ups.  She has previously had injections in the bilateral shoulders again approximately 10 years prior with Dr. Arville Go.  She is somewhat hesitant to get these as these were significantly painful for her.  She takes naproxen for the pain although this is mildly helpful.  She is here today as a referral from Dr. Georgina Snell for discussion of additional options.  She is overall very active and goes to the gym frequently.  She states that this is what is bothersome to her the most.  Denies any significant limitations of motion.  She is able to sleep at night without much additional pain    Surgical History:   None  PMH/PSH/Family History/Social History/Meds/Allergies:    Past Medical History:  Diagnosis Date   Breast cancer ([place]) 08/2004   Left breast invasive ductal carcinoma   CHICKENPOX, HX OF 03/03/2010   COMMON MIGRAINE 03/03/2010   DEGENERATIVE DISC DISEASE 03/03/2010   Essential hypertension,  benign 03/03/2010   Foot pain, bilateral 02/08/2016   HYPOKALEMIA 03/03/2010   Increased vitamin B12 level 01/10/2015   Insomnia 08/01/2016   Low back pain 08/01/2016   MEASLES, HX OF 03/03/2010   Melanoma of skin, site unspecified 03/03/2010   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   NEOPLASM, MALIGNANT, BREAST, HX OF 03/03/2010   OSTEOPENIA 03/03/2010   PALPITATIONS, HX OF 03/03/2010   Pulmonary nodules 01/07/2015   Rib fracture    left, 3 fractures s/p radiation: right side 2 fractured, no surgery   Past Surgical History:  Procedure Laterality Date   BREAST LUMPECTOMY WITH NEEDLE LOCALIZATION AND AXILLARY SENTINEL LYMPH NODE BX Left 08/2004   clavicle dislocate  76 yrs old   right, reapproximated w/pins, subsequently removed   HERNIA REPAIR  05/2012   left hip traumatic history     hematoma surgically evacuated   SHOULDER ARTHROSCOPY     b/l shoulder with debridement   TONSILLECTOMY     Social History   Socioeconomic History   Marital status: Married    Spouse name: Not on file   Number of children: Not on file   Years of education: Not on file   Highest education level: Not on file  Occupational History   Not on file  Tobacco Use   Smoking status: Former  Packs/day: 0.25    Types: Cigarettes    Quit date: 02/07/1968    Years since quitting: 53.5   Smokeless tobacco: Never  Vaping Use   Vaping Use: Never used  Substance and Sexual Activity   Alcohol use: Yes    Comment: weekly   Drug use: No   Sexual activity: Not Currently    Birth control/protection: Post-menopausal  Other Topics Concern   Not on file  Social History Narrative   Not on file   Social Determinants of Health   Financial Resource Strain: Low Risk  (08/17/2020)   Overall Financial Resource Strain (CARDIA)    Difficulty of Paying Living Expenses: Not hard at all  Food Insecurity: No Food Insecurity (08/17/2020)   Hunger Vital Sign    Worried About Running Out of Food in the Last Year: Never true    Ran Out of  Food in the Last Year: Never true  Transportation Needs: No Transportation Needs (08/17/2020)   PRAPARE - Hydrologist (Medical): No    Lack of Transportation (Non-Medical): No  Physical Activity: Sufficiently Active (08/17/2020)   Exercise Vital Sign    Days of Exercise per Week: 5 days    Minutes of Exercise per Session: 60 min  Stress: No Stress Concern Present (08/17/2020)   [place]    Feeling of Stress : Not at all  Social Connections: Socially Integrated (08/17/2020)   Social Connection and Isolation Panel [NHANES]    Frequency of Communication with Friends and Family: More than three times a week    Frequency of Social Gatherings with Friends and Family: More than three times a week    Attends Religious Services: 1 to 4 times per year    Active Member of [employer] or Organizations: Yes    Attends Music therapist: 1 to 4 times per year    Marital Status: Married   Family History  Problem Relation Age of Onset   Hypertension Mother    COPD Mother    Alcohol abuse Father        history of   Coronary artery disease Father    Osteoarthritis Brother        knees   Basal cell carcinoma Sister    Heart disease Maternal Grandmother    Stroke Maternal Grandfather    Osteoarthritis Sister        knees   Migraines Sister    Allergies  Allergen Reactions   Hydrocodone-Acetaminophen Other (See Comments)    Slows Respiration Rate Down   Codeine     REACTION: upsets stomach   Demerol     hives   Doxycycline Hives   Meperidine Hcl     REACTION: Hives   Current Outpatient Medications  Medication Sig Dispense Refill   Ascorbic Acid (VITAMIN C) 1000 MG tablet Take 1,000 mg by mouth daily.     Calcium-Vitamin D-Vitamin K (CALCIUM SOFT CHEWS PO) Take 1 tablet by mouth 2 (two) times daily.     Efinaconazole 10 % SOLN Apply 1 drop topically daily. 4 mL 11   EPINEPHrine (EPIPEN  2-PAK) 0.3 mg/0.3 mL IJ SOAJ injection Inject 0.3 mg into the muscle as needed for anaphylaxis. 1 each 2   famotidine (PEPCID) 40 MG tablet Take by mouth.     glucosamine-chondroitin 500-400 MG tablet Take 1 tablet by mouth daily.     Lactobacillus (PROBIOTIC ACIDOPHILUS PO) Take by mouth.  meloxicam (MOBIC) 15 MG tablet TAKE 1 TABLET BY MOUTH EVERY DAY AS NEEDED FOR PAIN 30 tablet 0   metoprolol succinate (TOPROL-XL) 50 MG 24 hr tablet TAKE 1 TABLET BY MOUTH EVERY DAY WITH OR IMMEDIATELY FOLLOWING A MEAL 90 tablet 1   Tavaborole (KERYDIN) 5 % SOLN Apply 1 drop topically 1 day or 1 dose. Apply 1 drop to the toenail daily. 10 mL 2   Vitamins-Lipotropics (B-50) TABS Take 1 tablet by mouth daily.     zolpidem (AMBIEN) 5 MG tablet Take 1 tablet (5 mg total) by mouth at bedtime as needed for sleep. 30 tablet 1   No current facility-administered medications for this visit.   No results found.  Review of Systems:   A ROS was performed including pertinent positives and negatives as documented in the HPI.  Physical Exam :   Constitutional: NAD and appears stated age Neurological: Alert and oriented Psych: Appropriate affect and cooperative There were no vitals taken for this visit.   Comprehensive Musculoskeletal Exam:    Musculoskeletal Exam    Inspection Right Left  Skin No atrophy or winging No atrophy or winging  Palpation    Tenderness Glenohumeral lateral deltoid Glenohumeral lateral deltoid  Range of Motion    Flexion (passive) 170 170  Flexion (active) 170 170  Abduction 170 170  ER at the side 70 50  Can reach behind back to L5 Sacrum  Strength     Full Full with pain negative belly press  Special Tests    Pseudoparalytic No No  Neurologic    Fires PIN, radial, median, ulnar, musculocutaneous, axillary, suprascapular, long thoracic, and spinal accessory innervated muscles. No abnormal sensibility  Vascular/Lymphatic    Radial Pulse 2+ 2+  Cervical Exam    Patient has  symmetric cervical range of motion with negative Spurling's test.  Special Test:    Positive tenderness over the lateral epicondyle of the left elbow with radiation into the forearm  Imaging:   Xray (3 views right shoulder, 3 views left shoulder): She has moderate osteoarthritis involving the left worse than the right shoulder   I personally reviewed and interpreted the radiographs.   Assessment:   76 year old female with left worse than right shoulder glenohumeral osteoarthritis.  Overall she feels much better after left ultrasound-guided injection.  I will plan to perform these on an as-needed basis.  She will see me back as needed  Plan :    -Return to clinic as needed    I personally saw and evaluated the patient, and participated in the management and treatment plan.  Vanetta Mulders, MD Attending Physician, Orthopedic Surgery  This document was dictated using Dragon voice recognition software. A reasonable attempt at proof reading has been made to minimize errors.

## 2021-08-23 ENCOUNTER — Ambulatory Visit (INDEPENDENT_AMBULATORY_CARE_PROVIDER_SITE_OTHER): Payer: Medicare Other

## 2021-08-23 DIAGNOSIS — Z0000 Encounter for general adult medical examination without abnormal findings: Secondary | ICD-10-CM | POA: Diagnosis not present

## 2021-08-23 NOTE — Progress Notes (Signed)
Virtual Visit via Telephone Note  I connected with  Carrie [name] on 08/23/21 at  1:00 PM EDT by telephone and verified that I am speaking with the correct person using two identifiers.  Medicare Annual Wellness visit completed telephonically due to Covid-19 pandemic.   Persons participating in this call: This Health Coach and this patient.   Location: Patient: home  Provider: office    I discussed the limitations, risks, security and privacy concerns of performing an evaluation and management service by telephone and the availability of in person appointments. The patient expressed understanding and agreed to proceed.  Unable to perform video visit due to video visit attempted and failed and/or patient does not have video capability.   Some vital signs may be absent or patient reported.   Carrie Brace, LPN   Subjective:   Carrie [name] is a 76 y.o. female who presents for Medicare Annual (Subsequent) preventive examination.  Review of Systems     Cardiac Risk Factors include: advanced age (>44mn, >>19women);dyslipidemia;hypertension     Objective:    There were no vitals filed for this visit. There is no height or weight on file to calculate BMI.     08/23/2021    1:06 PM 08/17/2020    3:06 PM 03/03/2020    4:13 PM 03/31/2019    1:57 PM 03/19/2018    2:28 PM 01/23/2017    4:10 PM 01/21/2016    3:06 PM  Advanced Directives  Does Patient Have a Medical Advance Directive? Yes Yes Yes Yes Yes Yes;No Yes  Type of AParamedicof AAmandaLiving will  [place] will [place] will [place] will [place]Living will  Does patient want to make changes to medical advance directive?    No - Patient declined No - Patient declined    Copy of [place]? Yes - validated most recent copy scanned in  chart (See row information) No - copy requested  No - copy requested No - copy requested No - copy requested No - copy requested    Current Medications (verified) Outpatient Encounter Medications as of 08/23/2021  Medication Sig   Ascorbic Acid (VITAMIN C) 1000 MG tablet Take 1,000 mg by mouth daily.   Calcium-Vitamin D-Vitamin K (CALCIUM SOFT CHEWS PO) Take 1 tablet by mouth 2 (two) times daily.   Efinaconazole 10 % SOLN Apply 1 drop topically daily.   EPINEPHrine (EPIPEN 2-PAK) 0.3 mg/0.3 mL IJ SOAJ injection Inject 0.3 mg into the muscle as needed for anaphylaxis.   famotidine (PEPCID) 40 MG tablet Take by mouth.   glucosamine-chondroitin 500-400 MG tablet Take 1 tablet by mouth daily.   Lactobacillus (PROBIOTIC ACIDOPHILUS PO) Take by mouth.   meloxicam (MOBIC) 15 MG tablet TAKE 1 TABLET BY MOUTH EVERY DAY AS NEEDED FOR PAIN   metoprolol succinate (TOPROL-XL) 50 MG 24 hr tablet TAKE 1 TABLET BY MOUTH EVERY DAY WITH OR IMMEDIATELY FOLLOWING A MEAL   Tavaborole (KERYDIN) 5 % SOLN Apply 1 drop topically 1 day or 1 dose. Apply 1 drop to the toenail daily.   Vitamins-Lipotropics (B-50) TABS Take 1 tablet by mouth daily.   zolpidem (AMBIEN) 5 MG tablet Take 1 tablet (5 mg total) by mouth at bedtime as needed for sleep.   No facility-administered encounter medications on file as of 08/23/2021.    Allergies (verified) Hydrocodone-acetaminophen, Codeine, Demerol, Doxycycline, and Meperidine  hcl   History: Past Medical History:  Diagnosis Date   Breast cancer ([place]) 08/2004   Left breast invasive ductal carcinoma   CHICKENPOX, HX OF 03/03/2010   COMMON MIGRAINE 03/03/2010   DEGENERATIVE DISC DISEASE 03/03/2010   Essential hypertension, benign 03/03/2010   Foot pain, bilateral 02/08/2016   HYPOKALEMIA 03/03/2010   Increased vitamin B12 level 01/10/2015   Insomnia 08/01/2016   Low back pain 08/01/2016   MEASLES, HX OF 03/03/2010   Melanoma of skin, site unspecified 03/03/2010   Mumps encephalitis  03/03/2010   NEOPLASM, MALIGNANT, BREAST, HX OF 03/03/2010   OSTEOPENIA 03/03/2010   PALPITATIONS, HX OF 03/03/2010   Pulmonary nodules 01/07/2015   Rib fracture    left, 3 fractures s/p radiation: right side 2 fractured, no surgery   Past Surgical History:  Procedure Laterality Date   BREAST LUMPECTOMY WITH NEEDLE LOCALIZATION AND AXILLARY SENTINEL LYMPH NODE BX Left 08/2004   clavicle dislocate  75 yrs old   right, reapproximated w/pins, subsequently removed   HERNIA REPAIR  05/2012   left hip traumatic history     hematoma surgically evacuated   SHOULDER ARTHROSCOPY     b/l shoulder with debridement   TONSILLECTOMY     Family History  Problem Relation Age of Onset   Hypertension Mother    COPD Mother    Alcohol abuse Father        history of   Coronary artery disease Father    Osteoarthritis Brother        knees   Basal cell carcinoma Sister    Heart disease Maternal Grandmother    Stroke Maternal Grandfather    Osteoarthritis Sister        knees   Migraines Sister    Social History   Socioeconomic History   Marital status: Married    Spouse name: Not on file   Number of children: Not on file   Years of education: Not on file   Highest education level: Not on file  Occupational History   Not on file  Tobacco Use   Smoking status: Former    Packs/day: 0.25    Types: Cigarettes    Quit date: 02/07/1968    Years since quitting: 53.5   Smokeless tobacco: Never  Vaping Use   Vaping Use: Never used  Substance and Sexual Activity   Alcohol use: Yes    Comment: weekly   Drug use: No   Sexual activity: Not Currently    Birth control/protection: Post-menopausal  Other Topics Concern   Not on file  Social History Narrative   Not on file   Social Determinants of Health   Financial Resource Strain: Low Risk  (08/23/2021)   Overall Financial Resource Strain (CARDIA)    Difficulty of Paying Living Expenses: Not hard at all  Food Insecurity: No Food Insecurity  (08/23/2021)   Hunger Vital Sign    Worried About Running Out of Food in the Last Year: Never true    Ran Out of Food in the Last Year: Never true  Transportation Needs: No Transportation Needs (08/23/2021)   PRAPARE - Hydrologist (Medical): No    Lack of Transportation (Non-Medical): No  Physical Activity: Sufficiently Active (08/23/2021)   Exercise Vital Sign    Days of Exercise per Week: 4 days    Minutes of Exercise per Session: 90 min  Stress: No Stress Concern Present (08/23/2021)   [place]  Feeling of Stress : Not at all  Social Connections: Socially Integrated (08/23/2021)   Social Connection and Isolation Panel [NHANES]    Frequency of Communication with Friends and Family: More than three times a week    Frequency of Social Gatherings with Friends and Family: More than three times a week    Attends Religious Services: 1 to 4 times per year    Active Member of [employer] or Organizations: Yes    Attends Archivist Meetings: 1 to 4 times per year    Marital Status: Married    Tobacco Counseling Counseling given: Not Answered   Clinical Intake:  Pre-visit preparation completed: Yes  Pain : No/denies pain     BMI - recorded: 20.19 Nutritional Status: BMI of 19-24  Normal Nutritional Risks: None Diabetes: No  How often do you need to have someone help you when you read instructions, pamphlets, or other written materials from your doctor or pharmacy?: 1 - Never  Diabetic?no  Interpreter Needed?: No  Information entered by :: Charlott Rakes, LPN   Activities of Daily Living    08/23/2021    1:07 PM  In your present state of health, do you have any difficulty performing the following activities:  Hearing? 0  Vision? 0  Difficulty concentrating or making decisions? 0  Walking or climbing stairs? 0  Dressing or bathing? 0  Doing errands, shopping? 0   Preparing Food and eating ? N  Using the Toilet? N  In the past six months, have you accidently leaked urine? Y  Comment AT times  Do you have problems with loss of bowel control? N  Managing your Medications? N  Managing your Finances? N  Housekeeping or managing your Housekeeping? N    Patient Care Team: [name], MD as PCP - General (Family Medicine) Danella Sensing, MD as Consulting Physician (Dermatology) Richmond Campbell, MD as Consulting Physician (Gastroenterology) Fay Records, MD as Consulting Physician (Cardiology) Linda Hedges, DO as Consulting Physician (Obstetrics and Gynecology) Kathie Rhodes, MD (Inactive) as Consulting Physician (Urology)  Indicate any recent Medical Services you may have received from other than [facility] providers in the past year (date may be approximate).     Assessment:   This is a routine wellness examination for [place].  Hearing/Vision screen Hearing Screening - Comments:: Pt denies any hearing issues  Vision Screening - Comments:: Pt follows up with Dr Sherral Hammers  for annual eye exams   Dietary issues and exercise activities discussed: Current Exercise Habits: Home exercise routine, Type of exercise: walking;Other - see comments, Time (Minutes): > 60, Frequency (Times/Week): 5, Weekly Exercise (Minutes/Week): 0   Goals Addressed             This Visit's Progress    Patient Stated       Stay healthy        Depression Screen    08/23/2021    1:05 PM 08/23/2020   10:28 AM 08/17/2020    3:09 PM 05/11/2020    9:12 AM 03/31/2019    2:01 PM 03/19/2018    2:29 PM 01/23/2017    4:10 PM  PHQ 2/9 Scores  PHQ - 2 Score 0 0 0 0 0 0 0    Fall Risk    08/23/2021    1:07 PM 08/23/2020   10:29 AM 08/17/2020    3:08 PM 05/11/2020    9:12 AM 03/31/2019    2:00 PM  [place] in the past year? 0  0 0 0  Number falls in past yr: 0 0 0 0 0  Injury with Fall? 0 0 0 0 0  Risk for fall due to : Impaired vision No Fall Risks     Follow  up Falls prevention discussed Falls evaluation completed Falls prevention discussed  Education provided;Falls prevention discussed    FALL RISK PREVENTION PERTAINING TO THE HOME:  Any stairs in or around the home? Yes  If so, are there any without handrails? No  Home free of loose throw rugs in walkways, pet beds, electrical cords, etc? Yes  Adequate lighting in your home to reduce risk of falls? Yes   ASSISTIVE DEVICES UTILIZED TO PREVENT FALLS:  Life alert? No  Use of a cane, walker or w/c? No  Grab bars in the bathroom? No  Shower chair or bench in shower? No  Elevated toilet seat or a handicapped toilet? No   TIMED UP AND GO:  Was the test performed? No .  Cognitive Function:    01/21/2016    3:06 PM  MMSE - Mini Mental State Exam  Orientation to time 5  Orientation to Place 5  Registration 3  Attention/ Calculation 5  Recall 3  Language- name 2 objects 2  Language- repeat 1  Language- follow 3 step command 3  Language- read & follow direction 1  Write a sentence 1  Copy design 1  Total score 30        08/23/2021    1:08 PM  6CIT Screen  What Year? 0 points  What month? 0 points  What time? 0 points  Count back from 20 0 points  Months in reverse 0 points  Repeat phrase 0 points  Total Score 0 points    Immunizations Immunization History  Administered Date(s) Administered   Fluad Quad(high Dose 65+) 10/10/2018   Influenza Whole 10/07/2009   Influenza, High Dose Seasonal PF 10/31/2017, 11/25/2019, 11/17/2020   Influenza,inj,Quad PF,6+ Mos 01/07/2015   Influenza-Unspecified 11/07/2015, 11/17/2020   PFIZER Comirnaty(Gray Top)Covid-19 Tri-Sucrose Vaccine 06/23/2020   PFIZER(Purple Top)SARS-COV-2 Vaccination 03/21/2019, 04/15/2019, 11/16/2019   Pfizer Covid-19 Vaccine Bivalent Booster 60yr & up 10/25/2020   Pneumococcal Conjugate-13 12/16/2013   Pneumococcal Polysaccharide-23 03/19/2018   Tdap 06/21/2011   Unspecified SARS-COV-2 Vaccination  03/21/2019, 04/15/2019, 11/16/2019, 06/23/2020, 10/25/2020   Zoster Recombinat (Shingrix) 04/19/2021, 07/27/2021   Zoster, Live 04/19/2021    TDAP status: Due, Education has been provided regarding the importance of this vaccine. Advised may receive this vaccine at local pharmacy or Health Dept. Aware to provide a copy of the vaccination record if obtained from local pharmacy or Health Dept. Verbalized acceptance and understanding.  Flu Vaccine status: Up to date  Pneumococcal vaccine status: Up to date  Covid-19 vaccine status: Completed vaccines  Qualifies for Shingles Vaccine? Yes   Zostavax completed Yes   Shingrix Completed?: Yes  Screening Tests Health Maintenance  Topic Date Due   TETANUS/TDAP  06/20/2021   INFLUENZA VACCINE  09/06/2021   MAMMOGRAM  06/24/2023   COLONOSCOPY (Pts [id number] coverage will need to be confirmed)  04/27/2031   Pneumonia Vaccine 6578Years old  Completed   DEXA SCAN  Completed   COVID-19 Vaccine  Completed   Hepatitis C Screening  Completed   Zoster Vaccines- Shingrix  Completed   HPV VACCINES  Aged Out    Health Maintenance  Health Maintenance Due  Topic Date Due   TETANUS/TDAP  06/20/2021    Colorectal cancer screening: Type of screening: Colonoscopy. Completed  04/26/21. Repeat every 10 years  Mammogram status: Completed 06/23/21. Repeat every year  Bone Density status: Completed 06/23/21. Results reflect: Bone density results: OSTEOPENIA. Repeat every 2 years.    Additional Screening:  Hepatitis C Screening: Completed 01/07/15  Vision Screening: Recommended annual ophthalmology exams for early detection of glaucoma and other disorders of the eye. Is the patient up to date with their annual eye exam?  Yes  Who is the provider or what is the name of the office in which the patient attends annual eye exams? Dr Sherral Hammers  If pt is not established with a provider, would they like to be referred to a provider to establish care? No .    Dental Screening: Recommended annual dental exams for proper oral hygiene  Community Resource Referral / Chronic Care Management: CRR required this visit?  No   CCM required this visit?  No      Plan:     I have personally reviewed and noted the following in the patient's chart:   Medical and social history Use of alcohol, tobacco or illicit drugs  Current medications and supplements including opioid prescriptions.  Functional ability and status Nutritional status Physical activity Advanced directives List of other physicians Hospitalizations, surgeries, and ER visits in previous 12 months Vitals Screenings to include cognitive, depression, and falls Referrals and appointments  In addition, I have reviewed and discussed with patient certain preventive protocols, quality metrics, and best practice recommendations. A written personalized care plan for preventive services as well as general preventive health recommendations were provided to patient.     Carrie Brace, LPN   [id number]   Nurse Notes: None

## 2021-08-23 NOTE — Patient Instructions (Signed)
Ms. Schloss , Thank you for taking time to come for your Medicare Wellness Visit. I appreciate your ongoing commitment to your health goals. Please review the following plan we discussed and let me know if I can assist you in the future.   Screening recommendations/referrals: Colonoscopy: Done 04/26/21 repeat every 10 years  Mammogram: completed 06/23/21 repeat every year  Bone Density: Done 06/23/21 repeat every 2 years  Recommended yearly ophthalmology/optometry visit for glaucoma screening and checkup Recommended yearly dental visit for hygiene and checkup  Vaccinations: Influenza vaccine: done 11/17/20 repeat every year  Pneumococcal vaccine: Up to date Tdap vaccine: due  Shingles vaccine: completed 04/19/21  & 07/27/21 Covid-19:completed 2/12, 3/9, 11/16/19 & 5/18, 10/25/20  Advanced directives: copies in chart   Conditions/risks identified: stay healthy   Next appointment: Follow up in one year for your annual wellness visit    Preventive Care 35 Years and Older, Female Preventive care refers to lifestyle choices and visits with your health care provider that can promote health and wellness. What does preventive care include? A yearly physical exam. This is also called an annual well check. Dental exams once or twice a year. Routine eye exams. Ask your health care provider how often you should have your eyes checked. Personal lifestyle choices, including: Daily care of your teeth and gums. Regular physical activity. Eating a healthy diet. Avoiding tobacco and drug use. Limiting alcohol use. Practicing safe sex. Taking low-dose aspirin every day. Taking vitamin and mineral supplements as recommended by your health care provider. What happens during an annual well check? The services and screenings done by your health care provider during your annual well check will depend on your age, overall health, lifestyle risk factors, and family history of disease. Counseling  Your health  care provider may ask you questions about your: Alcohol use. Tobacco use. Drug use. Emotional well-being. Home and relationship well-being. Sexual activity. Eating habits. History of falls. Memory and ability to understand (cognition). Work and work Statistician. Reproductive health. Screening  You may have the following tests or measurements: Height, weight, and BMI. Blood pressure. Lipid and cholesterol levels. These may be checked every 5 years, or more frequently if you are over 59 years old. Skin check. Lung cancer screening. You may have this screening every year starting at age 43 if you have a 30-pack-year history of smoking and currently smoke or have quit within the past 15 years. Fecal occult blood test (FOBT) of the stool. You may have this test every year starting at age 61. Flexible sigmoidoscopy or colonoscopy. You may have a sigmoidoscopy every 5 years or a colonoscopy every 10 years starting at age 30. Hepatitis C blood test. Hepatitis B blood test. Sexually transmitted disease (STD) testing. Diabetes screening. This is done by checking your blood sugar (glucose) after you have not eaten for a while (fasting). You may have this done every 1-3 years. Bone density scan. This is done to screen for osteoporosis. You may have this done starting at age 20. Mammogram. This may be done every 1-2 years. Talk to your health care provider about how often you should have regular mammograms. Talk with your health care provider about your test results, treatment options, and if necessary, the need for more tests. Vaccines  Your health care provider may recommend certain vaccines, such as: Influenza vaccine. This is recommended every year. Tetanus, diphtheria, and acellular pertussis (Tdap, Td) vaccine. You may need a Td booster every 10 years. Zoster vaccine. You may need this  after age 71. Pneumococcal 13-valent conjugate (PCV13) vaccine. One dose is recommended after age  54. Pneumococcal polysaccharide (PPSV23) vaccine. One dose is recommended after age 44. Talk to your health care provider about which screenings and vaccines you need and how often you need them. This information is not intended to replace advice given to you by your health care provider. Make sure you discuss any questions you have with your health care provider. Document Released: 02/19/2015 Document Revised: 10/13/2015 Document Reviewed: 11/24/2014 Elsevier Interactive Patient Education  2017 [place] Prevention in the Home Falls can cause injuries. They can happen to people of all ages. There are many things you can do to make your home safe and to help prevent falls. What can I do on the outside of my home? Regularly fix the edges of walkways and driveways and fix any cracks. Remove anything that might make you trip as you walk through a door, such as a raised step or threshold. Trim any bushes or trees on the path to your home. Use bright outdoor lighting. Clear any walking paths of anything that might make someone trip, such as rocks or tools. Regularly check to see if handrails are loose or broken. Make sure that both sides of any steps have handrails. Any raised decks and porches should have guardrails on the edges. Have any leaves, snow, or ice cleared regularly. Use sand or salt on walking paths during winter. Clean up any spills in your garage right away. This includes oil or grease spills. What can I do in the bathroom? Use night lights. Install grab bars by the toilet and in the tub and shower. Do not use towel bars as grab bars. Use non-skid mats or decals in the tub or shower. If you need to sit down in the shower, use a plastic, non-slip stool. Keep the floor dry. Clean up any water that spills on the floor as soon as it happens. Remove soap buildup in the tub or shower regularly. Attach bath mats securely with double-sided non-slip rug tape. Do not have throw  rugs and other things on the floor that can make you trip. What can I do in the bedroom? Use night lights. Make sure that you have a light by your bed that is easy to reach. Do not use any sheets or blankets that are too big for your bed. They should not hang down onto the floor. Have a firm chair that has side arms. You can use this for support while you get dressed. Do not have throw rugs and other things on the floor that can make you trip. What can I do in the kitchen? Clean up any spills right away. Avoid walking on wet floors. Keep items that you use a lot in easy-to-reach places. If you need to reach something above you, use a strong step stool that has a grab bar. Keep electrical cords out of the way. Do not use floor polish or wax that makes floors slippery. If you must use wax, use non-skid floor wax. Do not have throw rugs and other things on the floor that can make you trip. What can I do with my stairs? Do not leave any items on the stairs. Make sure that there are handrails on both sides of the stairs and use them. Fix handrails that are broken or loose. Make sure that handrails are as long as the stairways. Check any carpeting to make sure that it is firmly attached to the  stairs. Fix any carpet that is loose or worn. Avoid having throw rugs at the top or bottom of the stairs. If you do have throw rugs, attach them to the floor with carpet tape. Make sure that you have a light switch at the top of the stairs and the bottom of the stairs. If you do not have them, ask someone to add them for you. What else can I do to help prevent falls? Wear shoes that: Do not have high heels. Have rubber bottoms. Are comfortable and fit you well. Are closed at the toe. Do not wear sandals. If you use a stepladder: Make sure that it is fully opened. Do not climb a closed stepladder. Make sure that both sides of the stepladder are locked into place. Ask someone to hold it for you, if  possible. Clearly mark and make sure that you can see: Any grab bars or handrails. First and last steps. Where the edge of each step is. Use tools that help you move around (mobility aids) if they are needed. These include: Canes. Walkers. Scooters. Crutches. Turn on the lights when you go into a dark area. Replace any light bulbs as soon as they burn out. Set up your furniture so you have a clear path. Avoid moving your furniture around. If any of your floors are uneven, fix them. If there are any pets around you, be aware of where they are. Review your medicines with your doctor. Some medicines can make you feel dizzy. This can increase your chance of falling. Ask your doctor what other things that you can do to help prevent falls. This information is not intended to replace advice given to you by your health care provider. Make sure you discuss any questions you have with your health care provider. Document Released: 11/19/2008 Document Revised: 07/01/2015 Document Reviewed: 02/27/2014 Elsevier Interactive Patient Education  2017 [employer].

## 2021-08-24 NOTE — Progress Notes (Deleted)
Subjective:    Patient ID: Carrie [name], female    DOB: Jul 16, 1945, 76 y.o.   MRN: [id number]  No chief complaint on file.   HPI Patient is in today for a traditional medicare exam.  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   NEOPLASM, MALIGNANT, BREAST, HX OF 03/03/2010   OSTEOPENIA 03/03/2010   PALPITATIONS, HX OF 03/03/2010   Pulmonary nodules 01/07/2015   Rib fracture    left, 3 fractures s/p radiation: right side 2 fractured, no surgery    Past Surgical History:  Procedure Laterality Date   BREAST LUMPECTOMY WITH NEEDLE LOCALIZATION AND AXILLARY SENTINEL LYMPH NODE BX Left 08/2004   clavicle dislocate  76 yrs old   right, reapproximated w/pins, subsequently removed   HERNIA REPAIR  05/2012   left hip traumatic history     hematoma surgically evacuated   SHOULDER ARTHROSCOPY     b/l shoulder with debridement   TONSILLECTOMY      Family History  Problem Relation Age of Onset   Hypertension Mother    COPD Mother    Alcohol abuse Father        history of   Coronary artery disease Father    Osteoarthritis Brother        knees   Basal cell carcinoma Sister    Heart disease Maternal Grandmother    Stroke Maternal Grandfather    Osteoarthritis Sister        knees   Migraines Sister     Social History   Socioeconomic History   Marital status: Married    Spouse name: Not on file   Number of children: Not on file   Years of education: Not on file   Highest education level: Not on file  Occupational History    Not on file  Tobacco Use   Smoking status: Former    Packs/day: 0.25    Types: Cigarettes    Quit date: 02/07/1968    Years since quitting: 53.5   Smokeless tobacco: Never  Vaping Use   Vaping Use: Never used  Substance and Sexual Activity   Alcohol use: Yes    Comment: weekly   Drug use: No   Sexual activity: Not Currently    Birth control/protection: Post-menopausal  Other Topics Concern   Not on file  Social History Narrative   Not on file   Social Determinants of Health   Financial Resource Strain: Low Risk  (08/23/2021)   Overall Financial Resource Strain (CARDIA)    Difficulty of Paying Living Expenses: Not hard at all  Food Insecurity: No Food Insecurity (08/23/2021)   Hunger Vital Sign    Worried About Running Out of Food in the Last Year: Never true    Ran Out of Food in the Last Year: Never true  Transportation Needs: No Transportation Needs (08/23/2021)   PRAPARE - Hydrologist (Medical): No    Lack of Transportation (Non-Medical): No  Physical Activity: Sufficiently Active (08/23/2021)   Exercise Vital Sign  Days of Exercise per Week: 4 days    Minutes of Exercise per Session: 90 min  Stress: No Stress Concern Present (08/23/2021)   [place]    Feeling of Stress : Not at all  Social Connections: [place] (08/23/2021)   Social Connection and Isolation Panel [NHANES]    Frequency of Communication with Friends and Family: More than three times a week    Frequency of Social Gatherings with Friends and Family: More than three times a week    Attends Religious Services: 1 to 4 times per year    Active Member of [employer] or Organizations: Yes    Attends Archivist Meetings: 1 to 4 times per year    Marital Status: Married  Human resources officer Violence: Not At Risk (08/23/2021)   Humiliation, Afraid, Rape, and Kick questionnaire    Fear of Current or  Ex-Partner: No    Emotionally Abused: No    Physically Abused: No    Sexually Abused: No    Outpatient Medications Prior to Visit  Medication Sig Dispense Refill   Ascorbic Acid (VITAMIN C) 1000 MG tablet Take 1,000 mg by mouth daily.     Calcium-Vitamin D-Vitamin K (CALCIUM SOFT CHEWS PO) Take 1 tablet by mouth 2 (two) times daily.     Efinaconazole 10 % SOLN Apply 1 drop topically daily. 4 mL 11   EPINEPHrine (EPIPEN 2-PAK) 0.3 mg/0.3 mL IJ SOAJ injection Inject 0.3 mg into the muscle as needed for anaphylaxis. 1 each 2   famotidine (PEPCID) 40 MG tablet Take by mouth.     glucosamine-chondroitin 500-400 MG tablet Take 1 tablet by mouth daily.     Lactobacillus (PROBIOTIC ACIDOPHILUS PO) Take by mouth.     meloxicam (MOBIC) 15 MG tablet TAKE 1 TABLET BY MOUTH EVERY DAY AS NEEDED FOR PAIN 30 tablet 0   metoprolol succinate (TOPROL-XL) 50 MG 24 hr tablet TAKE 1 TABLET BY MOUTH EVERY DAY WITH OR IMMEDIATELY FOLLOWING A MEAL 90 tablet 1   Tavaborole (KERYDIN) 5 % SOLN Apply 1 drop topically 1 day or 1 dose. Apply 1 drop to the toenail daily. 10 mL 2   Vitamins-Lipotropics (B-50) TABS Take 1 tablet by mouth daily.     zolpidem (AMBIEN) 5 MG tablet Take 1 tablet (5 mg total) by mouth at bedtime as needed for sleep. 30 tablet 1   No facility-administered medications prior to visit.    Allergies  Allergen Reactions   Hydrocodone-Acetaminophen Other (See Comments)    Slows Respiration Rate Down   Codeine     REACTION: upsets stomach   Demerol     hives   Doxycycline Hives   Meperidine Hcl     REACTION: Hives    ROS     Objective:    Physical Exam  There were no vitals taken for this visit. Wt Readings from Last 3 Encounters:  04/27/21 125 lb (56.7 kg)  03/16/21 131 lb (59.4 kg)  02/24/21 128 lb 12.8 oz (58.4 kg)    Diabetic Foot Exam - Simple   No data filed    Lab Results  Component Value Date   WBC 5.3 04/14/2021   HGB 13.6 04/14/2021   HCT 40.1 04/14/2021   PLT  185.0 04/14/2021   GLUCOSE 119 (H) 04/14/2021   CHOL 206 (H) 04/14/2021   TRIG 142.0 04/14/2021   HDL 88.10 04/14/2021   LDLDIRECT 94.8 07/28/2010   LDLCALC 89 04/14/2021   ALT  12 04/28/2021   AST 17 04/28/2021   NA 139 04/14/2021   K 4.4 04/14/2021   CL 105 04/14/2021   CREATININE 0.66 04/14/2021   BUN 18 04/14/2021   CO2 29 04/14/2021   TSH 0.76 04/14/2021   HGBA1C 5.5 09/25/2017    Lab Results  Component Value Date   TSH 0.76 04/14/2021   Lab Results  Component Value Date   WBC 5.3 04/14/2021   HGB 13.6 04/14/2021   HCT 40.1 04/14/2021   MCV 99.6 04/14/2021   PLT 185.0 04/14/2021   Lab Results  Component Value Date   NA 139 04/14/2021   K 4.4 04/14/2021   CO2 29 04/14/2021   GLUCOSE 119 (H) 04/14/2021   BUN 18 04/14/2021   CREATININE 0.66 04/14/2021   BILITOT 0.6 04/28/2021   ALKPHOS 204 (A) 04/29/2021   AST 17 04/28/2021   ALT 12 04/28/2021   PROT 6.3 04/28/2021   ALBUMIN 4.3 04/28/2021   CALCIUM 8.9 04/14/2021   ANIONGAP 13 03/03/2020   GFR 86.91 04/14/2021   Lab Results  Component Value Date   CHOL 206 (H) 04/14/2021   Lab Results  Component Value Date   HDL 88.10 04/14/2021   Lab Results  Component Value Date   LDLCALC 89 04/14/2021   Lab Results  Component Value Date   TRIG 142.0 04/14/2021   Lab Results  Component Value Date   CHOLHDL 2 04/14/2021   Lab Results  Component Value Date   HGBA1C 5.5 09/25/2017       Assessment & Plan:   COLONOSCOPY: 04/26/2021 MAMMO: 06/23/2021 PAP:  PSA: n/a DEXA:   Problem List Items Addressed This Visit   None   I am having Carrie [name]" maintain her B-50, glucosamine-chondroitin, Calcium-Vitamin D-Vitamin K (CALCIUM SOFT CHEWS PO), vitamin C, Lactobacillus (PROBIOTIC ACIDOPHILUS PO), famotidine, EPINEPHrine, zolpidem, meloxicam, Efinaconazole, Tavaborole, and metoprolol succinate.  No orders of the defined types were placed in this encounter.

## 2021-08-25 ENCOUNTER — Ambulatory Visit (INDEPENDENT_AMBULATORY_CARE_PROVIDER_SITE_OTHER): Payer: Medicare Other | Admitting: Family Medicine

## 2021-08-25 ENCOUNTER — Encounter: Payer: Self-pay | Admitting: Family Medicine

## 2021-08-25 VITALS — BP 112/80 | HR 59 | Resp 20 | Ht 66.0 in | Wt 130.6 lb

## 2021-08-25 DIAGNOSIS — E538 Deficiency of other specified B group vitamins: Secondary | ICD-10-CM

## 2021-08-25 DIAGNOSIS — R748 Abnormal levels of other serum enzymes: Secondary | ICD-10-CM | POA: Diagnosis not present

## 2021-08-25 DIAGNOSIS — E785 Hyperlipidemia, unspecified: Secondary | ICD-10-CM

## 2021-08-25 DIAGNOSIS — I10 Essential (primary) hypertension: Secondary | ICD-10-CM | POA: Diagnosis not present

## 2021-08-25 DIAGNOSIS — E876 Hypokalemia: Secondary | ICD-10-CM

## 2021-08-25 DIAGNOSIS — L578 Other skin changes due to chronic exposure to nonionizing radiation: Secondary | ICD-10-CM

## 2021-08-25 NOTE — Patient Instructions (Addendum)
[place] tea do not drink  Tetanus at pharmacy  RSV in the fall at pharmacy  Flu and Covid shots in the fall  Soak in distilled white vinegar and hot water, apply vick s vapor rub and Lamisil  [place] books  Preventive Care 65 Years and Older, Female Preventive care refers to lifestyle choices and visits with your health care provider that can promote health and wellness. Preventive care visits are also called wellness exams. What can I expect for my preventive care visit? Counseling Your health care provider may ask you questions about your: Medical history, including: Past medical problems. Family medical history. Pregnancy and menstrual history. History of falls. Current health, including: Memory and ability to understand (cognition). Emotional well-being. Home life and relationship well-being. Sexual activity and sexual health. Lifestyle, including: Alcohol, nicotine or tobacco, and drug use. Access to firearms. Diet, exercise, and sleep habits. Work and work Statistician. Sunscreen use. Safety issues such as seatbelt and bike helmet use. Physical exam Your health care provider will check your: Height and weight. These may be used to calculate your BMI (body mass index). BMI is a measurement that tells if you are at a healthy weight. Waist circumference. This measures the distance around your waistline. This measurement also tells if you are at a healthy weight and may help predict your risk of certain diseases, such as type 2 diabetes and high blood pressure. Heart rate and blood pressure. Body temperature. Skin for abnormal spots. What immunizations do I need?  Vaccines are usually given at various ages, according to a schedule. Your health care provider will recommend vaccines for you based on your age, medical history, and lifestyle or other factors, such as travel or where you work. What tests do I need? Screening Your health care provider may recommend  screening tests for certain conditions. This may include: Lipid and cholesterol levels. Hepatitis C test. Hepatitis B test. HIV (human immunodeficiency virus) test. STI (sexually transmitted infection) testing, if you are at risk. Lung cancer screening. Colorectal cancer screening. Diabetes screening. This is done by checking your blood sugar (glucose) after you have not eaten for a while (fasting). Mammogram. Talk with your health care provider about how often you should have regular mammograms. BRCA-related cancer screening. This may be done if you have a family history of breast, ovarian, tubal, or peritoneal cancers. Bone density scan. This is done to screen for osteoporosis. Talk with your health care provider about your test results, treatment options, and if necessary, the need for more tests. Follow these instructions at home: Eating and drinking  Eat a diet that includes fresh fruits and vegetables, whole grains, lean protein, and low-fat dairy products. Limit your intake of foods with high amounts of sugar, saturated fats, and salt. Take vitamin and mineral supplements as recommended by your health care provider. Do not drink alcohol if your health care provider tells you not to drink. If you drink alcohol: Limit how much you have to 0-1 drink a day. Know how much alcohol is in your drink. In the [place] one drink equals one 12 oz bottle of beer (355 mL), one 5 oz glass of wine (148 mL), or one 1 oz glass of hard liquor (44 mL). Lifestyle Brush your teeth every morning and night with fluoride toothpaste. Floss one time each day. Exercise for at least 30 minutes 5 or more days each week. Do not use any products that contain nicotine or tobacco. These products include cigarettes, chewing tobacco, and vaping  devices, such as e-cigarettes. If you need help quitting, ask your health care provider. Do not use drugs. If you are sexually active, practice safe sex. Use a condom or other  form of protection in order to prevent STIs. Take aspirin only as told by your health care provider. Make sure that you understand how much to take and what form to take. Work with your health care provider to find out whether it is safe and beneficial for you to take aspirin daily. Ask your health care provider if you need to take a cholesterol-lowering medicine (statin). Find healthy ways to manage stress, such as: Meditation, yoga, or listening to music. Journaling. Talking to a trusted person. Spending time with friends and family. Minimize exposure to UV radiation to reduce your risk of skin cancer. Safety Always wear your seat belt while driving or riding in a vehicle. Do not drive: If you have been drinking alcohol. Do not ride with someone who has been drinking. When you are tired or distracted. While texting. If you have been using any mind-altering substances or drugs. Wear a helmet and other protective equipment during sports activities. If you have firearms in your house, make sure you follow all gun safety procedures. What's next? Visit your health care provider once a year for an annual wellness visit. Ask your health care provider how often you should have your eyes and teeth checked. Stay up to date on all vaccines. This information is not intended to replace advice given to you by your health care provider. Make sure you discuss any questions you have with your health care provider. Document Revised: 07/21/2020 Document Reviewed: 07/21/2020 Elsevier Patient Education  [place].

## 2021-08-25 NOTE — Progress Notes (Signed)
Subjective:   By signing my name below, I, Carrie [name], attest that this documentation has been prepared under the direction and in the presence of Carrie Lukes, MD 08/25/2021.   Patient ID: Carrie [name], female    DOB: 1945-06-28, 76 y.o.   MRN: [id number]  Chief Complaint  Patient presents with   Follow-up   HPI Patient is in today for an office visit.  Squamous cell carcinoma: She reports that she has recently seen her dermatologist Dr. Wilhemina Bonito to manager her squamous cell carcinoma. She has a well-differentiated squamous cell carcinoma on her right tibia. She was prescribed Imiquimod and has been applying it to the necessary sites. She also reports that she has a melanoma on her back.  Toenail fungus: She has seen a podiatrist to manage her bilateral big-toe toenail fungus and is inquiring about a recommendation to better manage it. She was prescribed Tavaborole topical ointment but reports that it has been nonaffective at managing the fungus.   Family history: She reports that her sister also has elevated alkaline phosphate levels.   Immunizations: She is UTD on her shingles and pneumonia immunizations. She has been informed about receiving the RSV and COVID-19 immunizations in the fall. She is due for a tetanus immunization.   Physical therapy: She reports undergoing physical therapy to manage her arthritis in her left arm. She has been seeing Dr. Sammuel Hines.   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   NEOPLASM,  MALIGNANT, BREAST, HX OF 03/03/2010   OSTEOPENIA 03/03/2010   PALPITATIONS, HX OF 03/03/2010   Pulmonary nodules 01/07/2015   Rib fracture    left, 3 fractures s/p radiation: right side 2 fractured, no surgery   Past Surgical History:  Procedure Laterality Date   BREAST LUMPECTOMY WITH NEEDLE LOCALIZATION AND AXILLARY SENTINEL LYMPH NODE BX Left 08/2004   clavicle dislocate  76 yrs old   right, reapproximated w/pins, subsequently removed   HERNIA REPAIR  05/2012   left hip traumatic history     hematoma surgically evacuated   SHOULDER ARTHROSCOPY     b/l shoulder with debridement   TONSILLECTOMY     Family History  Problem Relation Age of Onset   Hypertension Mother    COPD Mother    Alcohol abuse Father        history of   Coronary artery disease Father    Osteoarthritis Brother        knees   Basal cell carcinoma Sister    Heart disease Maternal Grandmother    Stroke Maternal Grandfather    Osteoarthritis Sister        knees   Migraines Sister    Social History   Socioeconomic History   Marital status: Married    Spouse name: Not on file   Number of children: Not on file   Years of education: Not on file   Highest education level: Not on file  Occupational History   Not on file  Tobacco Use   Smoking status: Former  Packs/day: 0.25    Types: Cigarettes    Quit date: 02/07/1968    Years since quitting: 53.5   Smokeless tobacco: Never  Vaping Use   Vaping Use: Never used  Substance and Sexual Activity   Alcohol use: Yes    Comment: weekly   Drug use: No   Sexual activity: Not Currently    Birth control/protection: Post-menopausal  Other Topics Concern   Not on file  Social History Narrative   Not on file   Social Determinants of Health   Financial Resource Strain: Low Risk  (08/23/2021)   Overall Financial Resource Strain (CARDIA)    Difficulty of Paying Living Expenses: Not hard at all  Food Insecurity: No Food Insecurity (08/23/2021)   Hunger Vital  Sign    Worried About Running Out of Food in the Last Year: Never true    Ran Out of Food in the Last Year: Never true  Transportation Needs: No Transportation Needs (08/23/2021)   PRAPARE - Hydrologist (Medical): No    Lack of Transportation (Non-Medical): No  Physical Activity: Sufficiently Active (08/23/2021)   Exercise Vital Sign    Days of Exercise per Week: 4 days    Minutes of Exercise per Session: 90 min  Stress: No Stress Concern Present (08/23/2021)   [place]    Feeling of Stress : Not at all  Social Connections: [place] (08/23/2021)   Social Connection and Isolation Panel [NHANES]    Frequency of Communication with Friends and Family: More than three times a week    Frequency of Social Gatherings with Friends and Family: More than three times a week    Attends Religious Services: 1 to 4 times per year    Active Member of [employer] or Organizations: Yes    Attends Archivist Meetings: 1 to 4 times per year    Marital Status: Married  Human resources officer Violence: Not At Risk (08/23/2021)   Humiliation, Afraid, Rape, and Kick questionnaire    Fear of Current or Ex-Partner: No    Emotionally Abused: No    Physically Abused: No    Sexually Abused: No   Outpatient Medications Prior to Visit  Medication Sig Dispense Refill   Ascorbic Acid (VITAMIN C) 1000 MG tablet Take 1,000 mg by mouth daily.     Calcium-Vitamin D-Vitamin K (CALCIUM SOFT CHEWS PO) Take 1 tablet by mouth 2 (two) times daily.     Efinaconazole 10 % SOLN Apply 1 drop topically daily. 4 mL 11   EPINEPHrine (EPIPEN 2-PAK) 0.3 mg/0.3 mL IJ SOAJ injection Inject 0.3 mg into the muscle as needed for anaphylaxis. 1 each 2   famotidine (PEPCID) 40 MG tablet Take by mouth.     glucosamine-chondroitin 500-400 MG tablet Take 1 tablet by mouth daily.     Lactobacillus (PROBIOTIC ACIDOPHILUS PO) Take by mouth.      metoprolol succinate (TOPROL-XL) 50 MG 24 hr tablet TAKE 1 TABLET BY MOUTH EVERY DAY WITH OR IMMEDIATELY FOLLOWING A MEAL 90 tablet 1   Tavaborole (KERYDIN) 5 % SOLN Apply 1 drop topically 1 day or 1 dose. Apply 1 drop to the toenail daily. 10 mL 2   Vitamins-Lipotropics (B-50) TABS Take 1 tablet by mouth daily.     zolpidem (AMBIEN) 5 MG tablet Take 1 tablet (5 mg total) by mouth at bedtime as needed for sleep. 30 tablet 1   meloxicam (MOBIC) 15 MG tablet TAKE  1 TABLET BY MOUTH EVERY DAY AS NEEDED FOR PAIN 30 tablet 0   No facility-administered medications prior to visit.   Allergies  Allergen Reactions   Hydrocodone-Acetaminophen Other (See Comments)    Slows Respiration Rate Down   Codeine     REACTION: upsets stomach   Demerol     hives   Doxycycline Hives   Meperidine Hcl     REACTION: Hives   Review of Systems  Skin:        (+) melanoma on back (+) squamous cell carcinoma on right tibia      Objective:    Physical Exam Constitutional:      General: She is not in acute distress.    Appearance: Normal appearance. She is not ill-appearing.  HENT:     Head: Normocephalic and atraumatic.     Right Ear: External ear normal.     Left Ear: External ear normal.  Eyes:     Extraocular Movements: Extraocular movements intact.     Right eye: No nystagmus.     Left eye: No nystagmus.     Pupils: Pupils are equal, round, and reactive to light.  Neck:     Vascular: No carotid bruit.  Cardiovascular:     Rate and Rhythm: Normal rate and regular rhythm.     Pulses: Normal pulses.     Heart sounds: Normal heart sounds. No murmur heard.    No gallop.     Comments: (-) pedal edema Pulmonary:     Effort: Pulmonary effort is normal. No respiratory distress.     Breath sounds: Normal breath sounds. No wheezing or rales.  Abdominal:     General: Bowel sounds are normal.     Palpations: Abdomen is soft.     Tenderness: There is no abdominal tenderness. There is no guarding.   Musculoskeletal:     Comments: Muscle strength 5/5 on upper and lower extremities   Feet:     Comments: Bilateral onychomycosis on big toes Lymphadenopathy:     Cervical: No cervical adenopathy.  Skin:    General: Skin is warm and dry.  Neurological:     Mental Status: She is alert and oriented to person, place, and time.     Deep Tendon Reflexes:     Reflex Scores:      Patellar reflexes are 2+ on the right side and 2+ on the left side. Psychiatric:        Mood and Affect: Mood normal.        Behavior: Behavior normal.        Judgment: Judgment normal.    BP 112/80 (BP Location: Left Arm, Patient Position: Sitting, Cuff Size: Normal)   Pulse (!) 59   Resp 20   Ht '5\' 6"'$  (1.676 m)   Wt 130 lb 9.6 oz (59.2 kg)   SpO2 95%   BMI 21.08 kg/m  Wt Readings from Last 3 Encounters:  08/25/21 130 lb 9.6 oz (59.2 kg)  04/27/21 125 lb (56.7 kg)  03/16/21 131 lb (59.4 kg)   Diabetic Foot Exam - Simple   No data filed    Lab Results  Component Value Date   WBC 3.9 (L) 08/25/2021   HGB 13.9 08/25/2021   HCT 40.5 08/25/2021   PLT 164.0 08/25/2021   GLUCOSE 95 08/25/2021   CHOL 203 (H) 08/25/2021   TRIG 128.0 08/25/2021   HDL 89.00 08/25/2021   LDLDIRECT 94.8 07/28/2010   LDLCALC 89 08/25/2021   ALT 11 08/25/2021  AST 19 08/25/2021   NA 140 08/25/2021   K 4.1 08/25/2021   CL 104 08/25/2021   CREATININE 0.72 08/25/2021   BUN 23 08/25/2021   CO2 28 08/25/2021   TSH 0.81 08/25/2021   HGBA1C 5.5 09/25/2017   Lab Results  Component Value Date   TSH 0.81 08/25/2021   Lab Results  Component Value Date   WBC 3.9 (L) 08/25/2021   HGB 13.9 08/25/2021   HCT 40.5 08/25/2021   MCV 102.1 (H) 08/25/2021   PLT 164.0 08/25/2021   Lab Results  Component Value Date   NA 140 08/25/2021   K 4.1 08/25/2021   CO2 28 08/25/2021   GLUCOSE 95 08/25/2021   BUN 23 08/25/2021   CREATININE 0.72 08/25/2021   BILITOT 0.5 08/25/2021   ALKPHOS 151 (H) 08/25/2021   AST 19 08/25/2021    ALT 11 08/25/2021   PROT 6.4 08/25/2021   ALBUMIN 4.2 08/25/2021   CALCIUM 8.7 08/25/2021   ANIONGAP 13 03/03/2020   GFR 82.63 08/25/2021   Lab Results  Component Value Date   CHOL 203 (H) 08/25/2021   Lab Results  Component Value Date   HDL 89.00 08/25/2021   Lab Results  Component Value Date   LDLCALC 89 08/25/2021   Lab Results  Component Value Date   TRIG 128.0 08/25/2021   Lab Results  Component Value Date   CHOLHDL 2 08/25/2021   Lab Results  Component Value Date   HGBA1C 5.5 09/25/2017   Colonoscopy: Last completed on 04/26/2021. Results were normal. Repeat in 10 years.  Mammogram: Last completed on 06/23/2021. Results were normal. Repeat in 1-2 years.   Pap smear: Last completed on 03/20/2016. Results were normal. No longer required.   Dexxa: Last completed on 06/23/2021.    Assessment & Plan:   Problem List Items Addressed This Visit     HYPOKALEMIA   ESSENTIAL HYPERTENSION, BENIGN - Primary    Well controlled, no changes to meds. Encouraged heart healthy diet such as the DASH diet and exercise as tolerated.       Relevant Orders   CBC (Completed)   Comprehensive metabolic panel (Completed)   TSH (Completed)   Elevated alkaline phosphatase level    Workup has been negative and she is asymptomatic. Will continue to monitor      Disorder of vitamin B12    Supplement and monitor      Relevant Orders   Vitamin B12 (Completed)   Hyperlipidemia    Encourage heart healthy diet such as MIND or DASH diet, increase exercise, avoid trans fats, simple carbohydrates and processed foods, consider a krill or fish or flaxseed oil cap daily.       Relevant Orders   Lipid panel (Completed)   Sun-damaged skin    She is following closely with dermatology and they  Have her using a topical treatment on her right cheek and she is hopeful      No orders of the defined types were placed in this encounter.  I, Penni Homans, MD, personally preformed the  services described in this documentation.  All medical record entries made by the scribe were at my direction and in my presence.  I have reviewed the chart and discharge instructions (if applicable) and agree that the record reflects my personal performance and is accurate and complete. 08/25/2021.  I,Mohammed Iqbal,acting as a scribe for Penni Homans, MD.,have documented all relevant documentation on the behalf of Penni Homans, MD,as directed by  Penni Homans, MD  while in the presence of Penni Homans, MD.  Penni Homans, MD

## 2021-08-26 DIAGNOSIS — L578 Other skin changes due to chronic exposure to nonionizing radiation: Secondary | ICD-10-CM | POA: Insufficient documentation

## 2021-08-26 LAB — CBC
HCT: 40.5 % (ref 36.0–46.0)
Hemoglobin: 13.9 g/dL (ref 12.0–15.0)
MCHC: 34.2 g/dL (ref 30.0–36.0)
MCV: 102.1 fl — ABNORMAL HIGH (ref 78.0–100.0)
Platelets: 164 10*3/uL (ref 150.0–400.0)
RBC: 3.97 Mil/uL (ref 3.87–5.11)
RDW: 13.6 % (ref 11.5–15.5)
WBC: 3.9 10*3/uL — ABNORMAL LOW (ref 4.0–10.5)

## 2021-08-26 LAB — LIPID PANEL
Cholesterol: 203 mg/dL — ABNORMAL HIGH (ref 0–200)
HDL: 89 mg/dL (ref >39.00)
LDL Cholesterol: 89 mg/dL (ref 0–99)
NonHDL: 114.31
Total CHOL/HDL Ratio: 2
Triglycerides: 128 mg/dL (ref 0.0–149.0)
VLDL: 25.6 mg/dL (ref 0.0–40.0)

## 2021-08-26 LAB — TSH: TSH: 0.81 u[IU]/mL (ref 0.35–5.50)

## 2021-08-26 LAB — COMPREHENSIVE METABOLIC PANEL
ALT: 11 U/L (ref 0–35)
AST: 19 U/L (ref 0–37)
Albumin: 4.2 g/dL (ref 3.5–5.2)
Alkaline Phosphatase: 151 U/L — ABNORMAL HIGH (ref 39–117)
BUN: 23 mg/dL (ref 6–23)
CO2: 28 mEq/L (ref 19–32)
Calcium: 8.7 mg/dL (ref 8.4–10.5)
Chloride: 104 mEq/L (ref 96–112)
Creatinine, Ser: 0.72 mg/dL (ref 0.40–1.20)
GFR: 82.63 mL/min (ref >60.00)
Glucose, Bld: 95 mg/dL (ref 70–99)
Potassium: 4.1 mEq/L (ref 3.5–5.1)
Sodium: 140 mEq/L (ref 135–145)
Total Bilirubin: 0.5 mg/dL (ref 0.2–1.2)
Total Protein: 6.4 g/dL (ref 6.0–8.3)

## 2021-08-26 LAB — VITAMIN B12: Vitamin B-12: 402 pg/mL (ref 211–911)

## 2021-08-26 NOTE — Assessment & Plan Note (Signed)
Encourage heart healthy diet such as MIND or DASH diet, increase exercise, avoid trans fats, simple carbohydrates and processed foods, consider a krill or fish or flaxseed oil cap daily.  °

## 2021-08-26 NOTE — Assessment & Plan Note (Signed)
Supplement and monitor 

## 2021-08-26 NOTE — Assessment & Plan Note (Signed)
She is following closely with dermatology and they  Have her using a topical treatment on her right cheek and she is hopeful

## 2021-08-26 NOTE — Assessment & Plan Note (Signed)
Well controlled, no changes to meds. Encouraged heart healthy diet such as the DASH diet and exercise as tolerated.  °

## 2021-08-26 NOTE — Assessment & Plan Note (Signed)
Workup has been negative and she is asymptomatic. Will continue to monitor

## 2021-08-29 ENCOUNTER — Other Ambulatory Visit: Payer: Self-pay

## 2021-09-08 DIAGNOSIS — C44729 Squamous cell carcinoma of skin of left lower limb, including hip: Secondary | ICD-10-CM | POA: Diagnosis not present

## 2021-09-08 DIAGNOSIS — Z85828 Personal history of other malignant neoplasm of skin: Secondary | ICD-10-CM | POA: Diagnosis not present

## 2021-09-08 DIAGNOSIS — C44722 Squamous cell carcinoma of skin of right lower limb, including hip: Secondary | ICD-10-CM | POA: Diagnosis not present

## 2021-09-20 ENCOUNTER — Encounter: Payer: Self-pay | Admitting: Family Medicine

## 2021-09-20 DIAGNOSIS — D485 Neoplasm of uncertain behavior of skin: Secondary | ICD-10-CM | POA: Diagnosis not present

## 2021-09-20 DIAGNOSIS — Z85828 Personal history of other malignant neoplasm of skin: Secondary | ICD-10-CM | POA: Diagnosis not present

## 2021-09-20 DIAGNOSIS — C44729 Squamous cell carcinoma of skin of left lower limb, including hip: Secondary | ICD-10-CM | POA: Diagnosis not present

## 2021-11-08 ENCOUNTER — Encounter: Payer: Self-pay | Admitting: Family Medicine

## 2021-11-20 ENCOUNTER — Other Ambulatory Visit: Payer: Self-pay | Admitting: Family Medicine

## 2021-12-13 DIAGNOSIS — D485 Neoplasm of uncertain behavior of skin: Secondary | ICD-10-CM | POA: Diagnosis not present

## 2021-12-13 DIAGNOSIS — C44319 Basal cell carcinoma of skin of other parts of face: Secondary | ICD-10-CM | POA: Diagnosis not present

## 2021-12-13 DIAGNOSIS — D225 Melanocytic nevi of trunk: Secondary | ICD-10-CM | POA: Diagnosis not present

## 2021-12-13 DIAGNOSIS — L570 Actinic keratosis: Secondary | ICD-10-CM | POA: Diagnosis not present

## 2021-12-13 DIAGNOSIS — Z85820 Personal history of malignant melanoma of skin: Secondary | ICD-10-CM | POA: Diagnosis not present

## 2021-12-13 DIAGNOSIS — C44311 Basal cell carcinoma of skin of nose: Secondary | ICD-10-CM | POA: Diagnosis not present

## 2021-12-13 DIAGNOSIS — Z85828 Personal history of other malignant neoplasm of skin: Secondary | ICD-10-CM | POA: Diagnosis not present

## 2021-12-13 DIAGNOSIS — C44519 Basal cell carcinoma of skin of other part of trunk: Secondary | ICD-10-CM | POA: Diagnosis not present

## 2021-12-13 DIAGNOSIS — L821 Other seborrheic keratosis: Secondary | ICD-10-CM | POA: Diagnosis not present

## 2021-12-13 DIAGNOSIS — L814 Other melanin hyperpigmentation: Secondary | ICD-10-CM | POA: Diagnosis not present

## 2022-02-07 DIAGNOSIS — C44311 Basal cell carcinoma of skin of nose: Secondary | ICD-10-CM | POA: Diagnosis not present

## 2022-02-07 DIAGNOSIS — Z85828 Personal history of other malignant neoplasm of skin: Secondary | ICD-10-CM | POA: Diagnosis not present

## 2022-02-26 NOTE — Assessment & Plan Note (Signed)
Supplement and monitor 

## 2022-02-26 NOTE — Assessment & Plan Note (Signed)
Time for repeat imaging in lungs

## 2022-02-26 NOTE — Assessment & Plan Note (Signed)
Encouraged increased hydration, 64 ounces of clear fluids daily. Minimize alcohol and caffeine. Eat small frequent meals with lean proteins and complex carbs. Avoid high and low blood sugars. Get adequate sleep, 7-8 hours a night. Needs exercise daily preferably in the morning.  

## 2022-02-26 NOTE — Assessment & Plan Note (Signed)
Well controlled, no changes to meds. Encouraged heart healthy diet such as the DASH diet and exercise as tolerated.

## 2022-02-26 NOTE — Assessment & Plan Note (Signed)
Elevated GI portion and had a colonoscopy in 2023

## 2022-02-27 ENCOUNTER — Ambulatory Visit (INDEPENDENT_AMBULATORY_CARE_PROVIDER_SITE_OTHER): Payer: Medicare Other | Admitting: Family Medicine

## 2022-02-27 VITALS — BP 118/78 | HR 76 | Temp 97.5°F | Resp 16 | Ht 67.0 in | Wt 130.0 lb

## 2022-02-27 DIAGNOSIS — G4700 Insomnia, unspecified: Secondary | ICD-10-CM

## 2022-02-27 DIAGNOSIS — E538 Deficiency of other specified B group vitamins: Secondary | ICD-10-CM | POA: Diagnosis not present

## 2022-02-27 DIAGNOSIS — R748 Abnormal levels of other serum enzymes: Secondary | ICD-10-CM | POA: Diagnosis not present

## 2022-02-27 DIAGNOSIS — Z79899 Other long term (current) drug therapy: Secondary | ICD-10-CM

## 2022-02-27 DIAGNOSIS — R918 Other nonspecific abnormal finding of lung field: Secondary | ICD-10-CM | POA: Diagnosis not present

## 2022-02-27 DIAGNOSIS — E785 Hyperlipidemia, unspecified: Secondary | ICD-10-CM

## 2022-02-27 DIAGNOSIS — G43009 Migraine without aura, not intractable, without status migrainosus: Secondary | ICD-10-CM

## 2022-02-27 DIAGNOSIS — I10 Essential (primary) hypertension: Secondary | ICD-10-CM | POA: Diagnosis not present

## 2022-02-27 NOTE — Progress Notes (Signed)
Subjective:   By signing my name below, I, Daiva Huge, attest that this documentation has been prepared under the direction and in the presence of Penni Homans, MD 02/27/22   Patient ID: Carrie [name], female    DOB: [birth year], 77 y.o.   MRN: [id number]  Chief Complaint  Patient presents with   Follow-up    Follow up    HPI Patient is in today for a follow-up visit.  She reports feeling generally healthy.  She had a colonoscopy on 04/26/2021. Findings show no neoplasia, mild diverticulosis of the colon in the sigmoid colon and moderate internal hemorrhoids.  Chronic Sleep Problems She has been experiencing chronic sleep issues. She has not had a sleep study. She wakes up in the middle of the night and cannot go back to sleep. Others have told her that she snores when she sleeps. She wakes up in the morning with headaches. She takes Ambien to manage this. She reports hydrating adequately. She continues to exercise regularly, aside from this past holiday season.  Right Lung Nodule She is due for another scan.  She is UTD on all vaccines except tetanus. She is interested in receiving an RSV vaccine. She reports eating healthy.    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   NEOPLASM, MALIGNANT, BREAST, HX OF 03/03/2010   OSTEOPENIA 03/03/2010   PALPITATIONS, HX OF 03/03/2010   Pulmonary nodules 01/07/2015   Rib fracture    left, 3 fractures s/p radiation: right side 2 fractured, no surgery    Past Surgical  History:  Procedure Laterality Date   BREAST LUMPECTOMY WITH NEEDLE LOCALIZATION AND AXILLARY SENTINEL LYMPH NODE BX Left 08/2004   clavicle dislocate  77 yrs old   right, reapproximated w/pins, subsequently removed   HERNIA REPAIR  05/2012   left hip traumatic history     hematoma surgically evacuated   SHOULDER ARTHROSCOPY     b/l shoulder with debridement   TONSILLECTOMY      Family History  Problem Relation Age of Onset   Hypertension Mother    COPD Mother    Alcohol abuse Father        history of   Coronary artery disease Father    Osteoarthritis Brother        knees   Basal cell carcinoma Sister    Heart disease Maternal Grandmother    Stroke Maternal Grandfather    Osteoarthritis Sister        knees   Migraines Sister     Social History   Socioeconomic History   Marital status: Married    Spouse name: Not on file   Number of children: Not on file   Years of education: Not on file   Highest education level: Not on file  Occupational History   Not on file  Tobacco Use   Smoking status: Former    Packs/day: 0.25    Types: Cigarettes    Quit date: 02/07/1968    Years since quitting: 54.0   Smokeless tobacco: Never  Vaping Use   Vaping Use: Never used  Substance and Sexual Activity   Alcohol use: Yes    Comment: weekly   Drug use: No   Sexual activity: Not Currently    Birth control/protection: Post-menopausal  Other Topics Concern   Not on file  Social History Narrative   Not on file   Social Determinants of Health   Financial Resource Strain: Low Risk  (08/23/2021)   Overall Financial Resource Strain (CARDIA)    Difficulty of Paying Living Expenses: Not hard at all  Food Insecurity: No Food Insecurity (08/23/2021)   Hunger Vital Sign    Worried About Running Out of Food in the Last Year: Never true    Ran Out of Food in the Last Year: Never true  Transportation Needs: No Transportation Needs (08/23/2021)   PRAPARE - Radiographer, therapeutic (Medical): No    Lack of Transportation (Non-Medical): No  Physical Activity: Sufficiently Active (08/23/2021)   Exercise Vital Sign    Days of Exercise per Week: 4 days    Minutes of Exercise per Session: 90 min  Stress: No Stress Concern Present (08/23/2021)   [place]    Feeling of Stress : Not at all  Social Connections: [place] (08/23/2021)   Social Connection and Isolation Panel [NHANES]    Frequency of Communication with Friends and Family: More than three times a week    Frequency of Social Gatherings with Friends and Family: More than three times a week    Attends Religious Services: 1 to 4 times per year    Active Member of [employer] or Organizations: Yes    Attends Archivist Meetings: 1 to 4 times per year    Marital Status: Married  Human resources officer Violence: Not At Risk (08/23/2021)   Humiliation, Afraid, Rape, and Kick questionnaire    Fear of Current or Ex-Partner: No    Emotionally Abused: No    Physically Abused: No    Sexually Abused: No    Outpatient Medications Prior to Visit  Medication Sig Dispense Refill   Ascorbic Acid (VITAMIN C) 1000 MG tablet Take 1,000 mg by mouth daily.     Calcium-Vitamin D-Vitamin K (CALCIUM SOFT CHEWS PO) Take 1 tablet by mouth 2 (two) times daily.     Efinaconazole 10 % SOLN Apply 1 drop topically daily. 4 mL 11   EPINEPHrine (EPIPEN 2-PAK) 0.3 mg/0.3 mL IJ SOAJ injection Inject 0.3 mg into the muscle as needed for anaphylaxis. 1 each 2   famotidine (PEPCID) 40 MG tablet Take by mouth.     glucosamine-chondroitin 500-400 MG tablet Take 1 tablet by mouth daily.     Lactobacillus (PROBIOTIC ACIDOPHILUS PO) Take by mouth.     metoprolol succinate (TOPROL-XL) 50 MG 24 hr tablet TAKE 1 TABLET BY MOUTH EVERY DAY WITH OR IMMEDIATELY FOLLOWING A MEAL 90 tablet 1   Vitamins-Lipotropics (B-50) TABS Take 1 tablet by mouth daily.     zolpidem  (AMBIEN) 5 MG tablet Take 1 tablet (5 mg total) by mouth at bedtime as needed for sleep. 30 tablet 1   Tavaborole (KERYDIN) 5 % SOLN Apply 1 drop topically 1 day or 1 dose. Apply 1 drop to the toenail daily. 10 mL 2   No facility-administered medications prior to visit.    Allergies  Allergen Reactions   Hydrocodone-Acetaminophen Other (See Comments)    Slows Respiration Rate Down   Codeine  REACTION: upsets stomach   Demerol     hives   Doxycycline Hives   Meperidine Hcl     REACTION: Hives    Review of Systems  Gastrointestinal:  Negative for abdominal pain, blood in stool and constipation.  Psychiatric/Behavioral:  The patient has insomnia.        Objective:    Physical Exam Constitutional:      General: She is not in acute distress.    Appearance: Normal appearance. She is not ill-appearing.  HENT:     Head: Normocephalic and atraumatic.     Right Ear: External ear normal.     Left Ear: External ear normal.  Eyes:     Extraocular Movements: Extraocular movements intact.     Pupils: Pupils are equal, round, and reactive to light.  Cardiovascular:     Rate and Rhythm: Normal rate and regular rhythm.     Heart sounds: Normal heart sounds. No murmur heard.    No gallop.  Pulmonary:     Effort: Pulmonary effort is normal. No respiratory distress.     Breath sounds: Normal breath sounds. No wheezing or rales.  Skin:    General: Skin is warm and dry.  Neurological:     Mental Status: She is alert and oriented to person, place, and time.  Psychiatric:        Judgment: Judgment normal.     BP 118/78 (BP Location: Right Arm, Patient Position: Sitting, Cuff Size: Normal)   Pulse 76   Temp (!) 97.5 F (36.4 C)   Resp 16   Ht '5\' 7"'$  (1.702 m)   Wt 130 lb (59 kg)   SpO2 96%   BMI 20.36 kg/m  Wt Readings from Last 3 Encounters:  02/27/22 130 lb (59 kg)  08/25/21 130 lb 9.6 oz (59.2 kg)  04/27/21 125 lb (56.7 kg)       Assessment & Plan:  Essential  hypertension, benign Assessment & Plan: Well controlled, no changes to meds. Encouraged heart healthy diet such as the DASH diet and exercise as tolerated.   Orders: -     CBC with Differential/Platelet -     Comprehensive metabolic panel -     TSH  Disorder of vitamin B12 Assessment & Plan: Supplement and monitor   Migraine without aura and without status migrainosus, not intractable Assessment & Plan: Encouraged increased hydration, 64 ounces of clear fluids daily. Minimize alcohol and caffeine. Eat small frequent meals with lean proteins and complex carbs. Avoid high and low blood sugars. Get adequate sleep, 7-8 hours a night. Needs exercise daily preferably in the morning.    Abnormal findings on diagnostic imaging of lung Assessment & Plan: Time for repeat imaging in lungs   Elevated alkaline phosphatase level Assessment & Plan: Elevated GI portion and had a colonoscopy in 2023  Orders: -     Alkaline phosphatase, isoenzymes; Future  High risk medication use -     Drug Monitoring Panel [phone] , Urine  Hyperlipidemia, unspecified hyperlipidemia type -     Lipid panel  Lung nodules -     CT CHEST WO CONTRAST; Future  Insomnia, unspecified type Assessment & Plan: Encouraged good sleep hygiene such as dark, quiet room. No blue/green glowing lights such as computer screens in bedroom. No alcohol or stimulants in evening. Cut down on caffeine as able. Regular exercise is helpful but not just prior to bed time.  Ambien prn      I,Alexander Ruley,acting as a scribe for  Penni Homans, MD.,have documented all relevant documentation on the behalf of Penni Homans, MD,as directed by  Penni Homans, MD while in the presence of Penni Homans, MD.   I, Penni Homans, MD, personally preformed the services described in this documentation.  All medical record entries made by the scribe were at my direction and in my presence.  I have reviewed the chart and discharge instructions (if  applicable) and agree that the record reflects my personal performance and is accurate and complete. 02/27/22   Penni Homans, MD

## 2022-02-27 NOTE — Patient Instructions (Addendum)
Tetanus at pharmacy  RSV, Respiratory Syncitial Virus vaccine, Arexvy at pharmacy  Yellow Split Pea Powder and Collagen powder

## 2022-02-27 NOTE — Assessment & Plan Note (Signed)
Encouraged good sleep hygiene such as dark, quiet room. No blue/green glowing lights such as computer screens in bedroom. No alcohol or stimulants in evening. Cut down on caffeine as able. Regular exercise is helpful but not just prior to bed time.  Ambien prn 

## 2022-02-28 LAB — CBC WITH DIFFERENTIAL/PLATELET
Basophils Absolute: 0 10*3/uL (ref 0.0–0.1)
Basophils Relative: 0.3 % (ref 0.0–3.0)
Eosinophils Absolute: 0.1 10*3/uL (ref 0.0–0.7)
Eosinophils Relative: 1.6 % (ref 0.0–5.0)
HCT: 40.2 % (ref 36.0–46.0)
Hemoglobin: 13.6 g/dL (ref 12.0–15.0)
Lymphocytes Relative: 32.4 % (ref 12.0–46.0)
Lymphs Abs: 1.8 10*3/uL (ref 0.7–4.0)
MCHC: 33.9 g/dL (ref 30.0–36.0)
MCV: 102.7 fl — ABNORMAL HIGH (ref 78.0–100.0)
Monocytes Absolute: 0.4 10*3/uL (ref 0.1–1.0)
Monocytes Relative: 6.6 % (ref 3.0–12.0)
Neutro Abs: 3.3 10*3/uL (ref 1.4–7.7)
Neutrophils Relative %: 59.1 % (ref 43.0–77.0)
Platelets: 189 10*3/uL (ref 150.0–400.0)
RBC: 3.92 Mil/uL (ref 3.87–5.11)
RDW: 12.8 % (ref 11.5–15.5)
WBC: 5.6 10*3/uL (ref 4.0–10.5)

## 2022-02-28 LAB — LIPID PANEL
Cholesterol: 219 mg/dL — ABNORMAL HIGH (ref 0–200)
HDL: 88.1 mg/dL (ref >39.00)
LDL Cholesterol: 107 mg/dL — ABNORMAL HIGH (ref 0–99)
NonHDL: 130.54
Total CHOL/HDL Ratio: 2
Triglycerides: 116 mg/dL (ref 0.0–149.0)
VLDL: 23.2 mg/dL (ref 0.0–40.0)

## 2022-02-28 LAB — COMPREHENSIVE METABOLIC PANEL
ALT: 13 U/L (ref 0–35)
AST: 18 U/L (ref 0–37)
Albumin: 4.3 g/dL (ref 3.5–5.2)
Alkaline Phosphatase: 167 U/L — ABNORMAL HIGH (ref 39–117)
BUN: 19 mg/dL (ref 6–23)
CO2: 29 mEq/L (ref 19–32)
Calcium: 9.2 mg/dL (ref 8.4–10.5)
Chloride: 104 mEq/L (ref 96–112)
Creatinine, Ser: 0.74 mg/dL (ref 0.40–1.20)
GFR: 79.67 mL/min (ref >60.00)
Glucose, Bld: 82 mg/dL (ref 70–99)
Potassium: 3.9 mEq/L (ref 3.5–5.1)
Sodium: 141 mEq/L (ref 135–145)
Total Bilirubin: 0.4 mg/dL (ref 0.2–1.2)
Total Protein: 6.5 g/dL (ref 6.0–8.3)

## 2022-02-28 LAB — TSH: TSH: 1.37 u[IU]/mL (ref 0.35–5.50)

## 2022-03-01 ENCOUNTER — Other Ambulatory Visit: Payer: Self-pay | Admitting: *Deleted

## 2022-03-01 ENCOUNTER — Encounter: Payer: Self-pay | Admitting: Family Medicine

## 2022-03-01 DIAGNOSIS — R748 Abnormal levels of other serum enzymes: Secondary | ICD-10-CM

## 2022-03-01 LAB — ALKALINE PHOSPHATASE, ISOENZYMES
Alkaline Phosphatase: 185 IU/L — ABNORMAL HIGH (ref 44–121)
BONE FRACTION: 37 % (ref 14–68)
INTESTINAL FRAC.: 52 % — ABNORMAL HIGH (ref 0–18)
LIVER FRACTION: 11 % — ABNORMAL LOW (ref 18–85)

## 2022-03-01 LAB — DRUG MONITORING PANEL 376104, URINE
Amphetamines: NEGATIVE ng/mL (ref <500)
Barbiturates: NEGATIVE ng/mL (ref <300)
Benzodiazepines: NEGATIVE ng/mL (ref <100)
Cocaine Metabolite: NEGATIVE ng/mL (ref <150)
Desmethyltramadol: NEGATIVE ng/mL (ref <100)
Opiates: NEGATIVE ng/mL (ref <100)
Oxycodone: NEGATIVE ng/mL (ref <100)
Tramadol: NEGATIVE ng/mL (ref <100)

## 2022-03-01 LAB — SPECIMEN STATUS REPORT

## 2022-03-01 LAB — DM TEMPLATE

## 2022-03-08 ENCOUNTER — Ambulatory Visit (HOSPITAL_BASED_OUTPATIENT_CLINIC_OR_DEPARTMENT_OTHER)
Admission: RE | Admit: 2022-03-08 | Discharge: 2022-03-08 | Disposition: A | Discharge status: Home / Self Care | Payer: Medicare Other | Source: Ambulatory Visit | Attending: Family Medicine | Admitting: Family Medicine

## 2022-03-08 DIAGNOSIS — R918 Other nonspecific abnormal finding of lung field: Secondary | ICD-10-CM

## 2022-03-21 ENCOUNTER — Ambulatory Visit (INDEPENDENT_AMBULATORY_CARE_PROVIDER_SITE_OTHER): Payer: Medicare Other | Admitting: Family

## 2022-03-21 VITALS — BP 129/62 | HR 56 | Temp 97.9°F | Resp 16 | Wt 127.0 lb

## 2022-03-21 DIAGNOSIS — R748 Abnormal levels of other serum enzymes: Secondary | ICD-10-CM

## 2022-03-21 DIAGNOSIS — K5792 Diverticulitis of intestine, part unspecified, without perforation or abscess without bleeding: Secondary | ICD-10-CM

## 2022-03-21 LAB — CBC WITH DIFFERENTIAL/PLATELET
Basophils Absolute: 0.1 10*3/uL (ref 0.0–0.1)
Basophils Relative: 0.5 % (ref 0.0–3.0)
Eosinophils Absolute: 0.1 10*3/uL (ref 0.0–0.7)
Eosinophils Relative: 0.6 % (ref 0.0–5.0)
HCT: 40 % (ref 36.0–46.0)
Hemoglobin: 13.6 g/dL (ref 12.0–15.0)
Lymphocytes Relative: 13.3 % (ref 12.0–46.0)
Lymphs Abs: 1.3 10*3/uL (ref 0.7–4.0)
MCHC: 34.1 g/dL (ref 30.0–36.0)
MCV: 102.7 fl — ABNORMAL HIGH (ref 78.0–100.0)
Monocytes Absolute: 0.5 10*3/uL (ref 0.1–1.0)
Monocytes Relative: 5.5 % (ref 3.0–12.0)
Neutro Abs: 7.9 10*3/uL — ABNORMAL HIGH (ref 1.4–7.7)
Neutrophils Relative %: 80.1 % — ABNORMAL HIGH (ref 43.0–77.0)
Platelets: 192 10*3/uL (ref 150.0–400.0)
RBC: 3.89 Mil/uL (ref 3.87–5.11)
RDW: 13.1 % (ref 11.5–15.5)
WBC: 9.8 10*3/uL (ref 4.0–10.5)

## 2022-03-21 LAB — COMPREHENSIVE METABOLIC PANEL
ALT: 11 U/L (ref 0–35)
AST: 16 U/L (ref 0–37)
Albumin: 4.1 g/dL (ref 3.5–5.2)
Alkaline Phosphatase: 150 U/L — ABNORMAL HIGH (ref 39–117)
BUN: 11 mg/dL (ref 6–23)
CO2: 27 mEq/L (ref 19–32)
Calcium: 9.5 mg/dL (ref 8.4–10.5)
Chloride: 102 mEq/L (ref 96–112)
Creatinine, Ser: 0.65 mg/dL (ref 0.40–1.20)
GFR: 86.66 mL/min (ref >60.00)
Glucose, Bld: 93 mg/dL (ref 70–99)
Potassium: 4.6 mEq/L (ref 3.5–5.1)
Sodium: 139 mEq/L (ref 135–145)
Total Bilirubin: 0.8 mg/dL (ref 0.2–1.2)
Total Protein: 6.4 g/dL (ref 6.0–8.3)

## 2022-03-21 MED ORDER — METRONIDAZOLE 500 MG PO TABS: 500.0000 mg | ORAL_TABLET | Freq: Three times a day (TID) | ORAL | 0 refills | Status: AC

## 2022-03-21 MED ORDER — CIPROFLOXACIN HCL 500 MG PO TABS: 500.0000 mg | ORAL_TABLET | Freq: Two times a day (BID) | ORAL | 0 refills | Status: AC

## 2022-03-21 NOTE — Assessment & Plan Note (Signed)
PCP planned for additional blood work which I will include today to save her an additional lab visit.

## 2022-03-21 NOTE — Assessment & Plan Note (Signed)
New. Symptoms are mild at this time and no acute abdomen on exam. Will initiate cipro/flagyl (pt knows not to consume alcohol while on flagyl). Call if new/worsening symptoms or if symptoms are not improved in 3-4 days.

## 2022-03-21 NOTE — Progress Notes (Addendum)
Subjective:   By signing my name below, I, Carrie [name], attest that this documentation has been prepared under the direction and in the presence of Debbrah Alar, NP. 03/21/2022   Patient ID: Carrie [name], female    DOB: 09-Sep-1945, 77 y.o.   MRN: [id number]  Chief Complaint  Patient presents with   Abdominal Pain    Complains of abdominal pain. LLQ. History of diverticulitis    Abdominal Pain   Patient is in today for a office visit.  Abdominal pain: She complains of left lower abdominal pain since Yesterday. Her pain worsens in waves. She felt like she was developing diarrhea on Sunday and thinks her pain started from that. She has a history of diverticulitis. She's had 3 flare ups of diverticulitis since 2016.    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   NEOPLASM, MALIGNANT, BREAST, HX OF 03/03/2010   OSTEOPENIA 03/03/2010   PALPITATIONS, HX OF 03/03/2010   Pulmonary nodules 01/07/2015   Rib fracture    left, 3 fractures s/p radiation: right side 2 fractured, no surgery    Past Surgical History:  Procedure Laterality Date   BREAST LUMPECTOMY WITH NEEDLE LOCALIZATION AND AXILLARY SENTINEL LYMPH NODE BX Left 08/2004   clavicle dislocate  77 yrs old   right, reapproximated w/pins, subsequently removed   HERNIA REPAIR  05/2012   left hip traumatic history     hematoma surgically evacuated   SHOULDER ARTHROSCOPY     b/l shoulder with debridement   TONSILLECTOMY      Family History  Problem Relation Age of  Onset   Hypertension Mother    COPD Mother    Alcohol abuse Father        history of   Coronary artery disease Father    Osteoarthritis Brother        knees   Basal cell carcinoma Sister    Heart disease Maternal Grandmother    Stroke Maternal Grandfather    Osteoarthritis Sister        knees   Migraines Sister     Social History   Socioeconomic History   Marital status: Married    Spouse name: Not on file   Number of children: Not on file   Years of education: Not on file   Highest education level: Not on file  Occupational History   Not on file  Tobacco Use   Smoking status: Former    Packs/day: 0.25    Types: Cigarettes    Quit date: 02/07/1968    Years since quitting: 54.1   Smokeless tobacco: Never  Vaping Use   Vaping Use: Never used  Substance and Sexual Activity   Alcohol use: Yes    Comment: weekly   Drug use: No   Sexual activity: Not Currently    Birth control/protection: Post-menopausal  Other Topics Concern   Not on file  Social History Narrative   Not on file   Social Determinants of Health   Financial Resource Strain: Low Risk  (08/23/2021)  Overall Financial Resource Strain (CARDIA)    Difficulty of Paying Living Expenses: Not hard at all  Food Insecurity: No Food Insecurity (08/23/2021)   Hunger Vital Sign    Worried About Running Out of Food in the Last Year: Never true    Ran Out of Food in the Last Year: Never true  Transportation Needs: No Transportation Needs (08/23/2021)   PRAPARE - Hydrologist (Medical): No    Lack of Transportation (Non-Medical): No  Physical Activity: Sufficiently Active (08/23/2021)   Exercise Vital Sign    Days of Exercise per Week: 4 days    Minutes of Exercise per Session: 90 min  Stress: No Stress Concern Present (08/23/2021)   [place]    Feeling of Stress : Not at all  Social Connections: [place]  (08/23/2021)   Social Connection and Isolation Panel [NHANES]    Frequency of Communication with Friends and Family: More than three times a week    Frequency of Social Gatherings with Friends and Family: More than three times a week    Attends Religious Services: 1 to 4 times per year    Active Member of [employer] or Organizations: Yes    Attends Archivist Meetings: 1 to 4 times per year    Marital Status: Married  Human resources officer Violence: Not At Risk (08/23/2021)   Humiliation, Afraid, Rape, and Kick questionnaire    Fear of Current or Ex-Partner: No    Emotionally Abused: No    Physically Abused: No    Sexually Abused: No    Outpatient Medications Prior to Visit  Medication Sig Dispense Refill   Ascorbic Acid (VITAMIN C) 1000 MG tablet Take 1,000 mg by mouth daily.     Calcium-Vitamin D-Vitamin K (CALCIUM SOFT CHEWS PO) Take 1 tablet by mouth 2 (two) times daily.     Efinaconazole 10 % SOLN Apply 1 drop topically daily. 4 mL 11   EPINEPHrine (EPIPEN 2-PAK) 0.3 mg/0.3 mL IJ SOAJ injection Inject 0.3 mg into the muscle as needed for anaphylaxis. 1 each 2   famotidine (PEPCID) 40 MG tablet Take by mouth.     glucosamine-chondroitin 500-400 MG tablet Take 1 tablet by mouth daily.     Lactobacillus (PROBIOTIC ACIDOPHILUS PO) Take by mouth.     metoprolol succinate (TOPROL-XL) 50 MG 24 hr tablet TAKE 1 TABLET BY MOUTH EVERY DAY WITH OR IMMEDIATELY FOLLOWING A MEAL 90 tablet 1   Vitamins-Lipotropics (B-50) TABS Take 1 tablet by mouth daily.     zolpidem (AMBIEN) 5 MG tablet Take 1 tablet (5 mg total) by mouth at bedtime as needed for sleep. 30 tablet 1   No facility-administered medications prior to visit.    Allergies  Allergen Reactions   Hydrocodone-Acetaminophen Other (See Comments)    Slows Respiration Rate Down   Codeine     REACTION: upsets stomach   Demerol     hives   Doxycycline Hives   Meperidine Hcl     REACTION: Hives    Review of Systems   Gastrointestinal:  Positive for abdominal pain (left lower quadrant pain).       Objective:    Physical Exam Constitutional:      General: She is not in acute distress.    Appearance: Normal appearance. She is not ill-appearing.  HENT:     Head: Normocephalic and atraumatic.     Right Ear: External ear normal.  Left Ear: External ear normal.  Eyes:     Extraocular Movements: Extraocular movements intact.     Pupils: Pupils are equal, round, and reactive to light.  Cardiovascular:     Rate and Rhythm: Normal rate and regular rhythm.     Heart sounds: Normal heart sounds. No murmur heard.    No gallop.  Pulmonary:     Effort: Pulmonary effort is normal. No respiratory distress.     Breath sounds: Normal breath sounds. No wheezing or rales.  Abdominal:     General: Bowel sounds are normal. There is no distension.     Palpations: Abdomen is soft.     Tenderness: There is abdominal tenderness (mild left lower quadrant pain). There is no guarding.  Skin:    General: Skin is warm and dry.  Neurological:     Mental Status: She is alert and oriented to person, place, and time.  Psychiatric:        Judgment: Judgment normal.     BP 129/62 (BP Location: Right Arm, Patient Position: Sitting, Cuff Size: Small)   Pulse (!) 56   Temp 97.9 F (36.6 C) (Oral)   Resp 16   Wt 127 lb (57.6 kg)   SpO2 100%   BMI 19.89 kg/m  Wt Readings from Last 3 Encounters:  03/21/22 127 lb (57.6 kg)  02/27/22 130 lb (59 kg)  08/25/21 130 lb 9.6 oz (59.2 kg)       Assessment & Plan:  Diverticulitis Assessment & Plan: New. Symptoms are mild at this time and no acute abdomen on exam. Will initiate cipro/flagyl (pt knows not to consume alcohol while on flagyl). Call if new/worsening symptoms or if symptoms are not improved in 3-4 days.   Orders: -     CBC with Differential/Platelet  Elevated alkaline phosphatase level Assessment & Plan: PCP planned for additional blood work which I will  include today to save her an additional lab visit.  Orders: -     Comprehensive metabolic panel -     Alkaline phosphatase, isoenzymes  Other orders -     Ciprofloxacin HCl; Take 1 tablet (500 mg total) by mouth 2 (two) times daily for 7 days.  Dispense: 14 tablet; Refill: 0 -     metroNIDAZOLE; Take 1 tablet (500 mg total) by mouth 3 (three) times daily for 7 days.  Dispense: 21 tablet; Refill: 0    I, Nance Pear, NP, personally preformed the services described in this documentation.  All medical record entries made by the scribe were at my direction and in my presence.  I have reviewed the chart and discharge instructions (if applicable) and agree that the record reflects my personal performance and is accurate and complete. 03/21/2022   I,Carrie [name],acting as a scribe for Nance Pear, NP.,have documented all relevant documentation on the behalf of Nance Pear, NP,as directed by  Nance Pear, NP while in the presence of Nance Pear, NP.   Nance Pear, NP

## 2022-03-23 ENCOUNTER — Ambulatory Visit (HOSPITAL_BASED_OUTPATIENT_CLINIC_OR_DEPARTMENT_OTHER)
Admission: RE | Admit: 2022-03-23 | Discharge: 2022-03-23 | Disposition: A | Discharge status: Home / Self Care | Payer: Medicare Other | Source: Ambulatory Visit | Attending: Family | Admitting: Family

## 2022-03-23 ENCOUNTER — Telehealth: Payer: Self-pay | Admitting: Family Medicine

## 2022-03-23 DIAGNOSIS — K5792 Diverticulitis of intestine, part unspecified, without perforation or abscess without bleeding: Secondary | ICD-10-CM

## 2022-03-23 DIAGNOSIS — K5730 Diverticulosis of large intestine without perforation or abscess without bleeding: Secondary | ICD-10-CM | POA: Diagnosis not present

## 2022-03-23 DIAGNOSIS — R1032 Left lower quadrant pain: Secondary | ICD-10-CM | POA: Diagnosis not present

## 2022-03-23 MED ORDER — IOHEXOL 300 MG/ML  SOLN
100.0000 mL | Freq: Once | INTRAMUSCULAR | Status: AC | PRN
  Administered 2022-03-23: 100 mL via INTRAVENOUS

## 2022-03-23 NOTE — Telephone Encounter (Signed)
I would recommend that we try to get a CT done today to take a closer look.  If pain worsens at any time, she should go to the ER. Continue antibiotics.

## 2022-03-23 NOTE — Telephone Encounter (Signed)
Patient advised she will receive a call this morning to come in for CT scan. Also to go to the ER if pain worsens.

## 2022-03-23 NOTE — Telephone Encounter (Signed)
Pt called stating she is still having some issues off her visit with Melissa on 2.13.24. Pt stated she has been taking the medication but she is still some pain in her abdomen. Pt would like to know what next steps can be taken in this regard. Pt would like Dr. Charlett Blake to be included in this to ensure it gets taken care of. Please Advise.

## 2022-03-24 ENCOUNTER — Telehealth: Payer: Self-pay | Admitting: Family

## 2022-03-24 NOTE — Telephone Encounter (Signed)
Spoke with pt, reviewed CT results.  She reports that sharp pain in her LL abdomen has resolved but she still has soreness.  She had a good bit of diarrhea yesterday following oral contrast which I told her is normal.  I recommended that she switch to a liquid diet for the next few days, continue cipro/flagyl and let me know if pain is not resolved by Monday and we can extend her antibiotics to 10 days. Pt verbalizes understanding.

## 2022-03-27 LAB — ALKALINE PHOSPHATASE, ISOENZYMES
Alkaline Phosphatase: 173 IU/L — ABNORMAL HIGH (ref 44–121)
BONE FRACTION: 52 % (ref 14–68)
INTESTINAL FRAC.: 11 % (ref 0–18)
LIVER FRACTION: 37 % (ref 18–85)

## 2022-03-28 ENCOUNTER — Encounter: Payer: Self-pay | Admitting: Family Medicine

## 2022-03-31 ENCOUNTER — Other Ambulatory Visit: Payer: Self-pay | Admitting: Family

## 2022-03-31 ENCOUNTER — Encounter: Payer: Self-pay | Admitting: Family Medicine

## 2022-03-31 ENCOUNTER — Telehealth: Payer: Self-pay | Admitting: Family Medicine

## 2022-03-31 MED ORDER — METRONIDAZOLE 500 MG PO TABS: 500.0000 mg | ORAL_TABLET | Freq: Three times a day (TID) | ORAL | 0 refills | Status: DC

## 2022-03-31 MED ORDER — CIPROFLOXACIN HCL 500 MG PO TABS: 500.0000 mg | ORAL_TABLET | Freq: Two times a day (BID) | ORAL | 0 refills | Status: DC

## 2022-03-31 MED ORDER — AMOXICILLIN-POT CLAVULANATE 875-125 MG PO TABS: 1.0000 | ORAL_TABLET | Freq: Two times a day (BID) | ORAL | 0 refills | Status: DC

## 2022-03-31 NOTE — Telephone Encounter (Signed)
Pt followed up to advise she hasn't heard back about whether she can get antibiotic. She is concerned that she is about to go into the weekend and won't have anything. Advised that her mychart message and phone call has been sent to provider but no message back in response. Pt wants a call back before we close to at least provide her with an update so she knows what to do.

## 2022-03-31 NOTE — Telephone Encounter (Signed)
It is Friday at 4:28 PM, we could send her to the ER /urgent care or send her antibiotics. Plan: Call patient, if she thinks she has diverticulitis okay to send: Augmentin 875 mg twice daily #14 no refills. If she is not sure she has diverticulitis and  has abdominal pain: Needs to go to urgent care. Seek medical attention if symptoms severe or not improving in the next few days.

## 2022-03-31 NOTE — Telephone Encounter (Signed)
Addendum, I see that our nurse practitioner sent Cipro and Flagyl. I think that would be okay as well. Please call the patient with the information I provided

## 2022-03-31 NOTE — Telephone Encounter (Signed)
Pt called stating that she believes her diverticulitis is flaring up again and may need another antibiotic to help. Pt stated that she had hives in response to one of the antibiotics she had but is unsure as to which one. Pt's MyChart is not operating correctly and all communication will have to be facilitated over the phone at this time. Pt was given MyChart help desk number to call to resolve the issue.

## 2022-03-31 NOTE — Telephone Encounter (Signed)
See phone call note.  Please call her.

## 2022-03-31 NOTE — Telephone Encounter (Signed)
Spoke with patient.  She stated that she was possibly having reaction to the Flagyl.  She decided to go with the Augmentin.  Advised if she gets worse to go to ER or UC.

## 2022-04-03 NOTE — Telephone Encounter (Signed)
Spoke with pt on 2/23

## 2022-04-19 ENCOUNTER — Other Ambulatory Visit: Payer: Self-pay | Admitting: Family Medicine

## 2022-04-19 ENCOUNTER — Telehealth: Payer: Self-pay | Admitting: Family Medicine

## 2022-04-19 ENCOUNTER — Other Ambulatory Visit: Payer: Self-pay

## 2022-04-19 MED ORDER — AMOXICILLIN-POT CLAVULANATE 875-125 MG PO TABS: 1.0000 | ORAL_TABLET | Freq: Two times a day (BID) | ORAL | 0 refills | Status: AC

## 2022-04-19 NOTE — Telephone Encounter (Signed)
Pt called stating that she is having issues with her diverticulitis flaring up and was wondering if meds could be sent in for her. Pt stated that she seems to do ok with amoxicillin and needed it sent to the following pharmacy since she is traveling:  [facility], [place], [place] [address] P: [phone]  Pt would also like to have a call placed to her to let her know what's going on.

## 2022-04-19 NOTE — Telephone Encounter (Signed)
Sent pt my chart.

## 2022-05-16 DIAGNOSIS — L570 Actinic keratosis: Secondary | ICD-10-CM | POA: Diagnosis not present

## 2022-05-16 DIAGNOSIS — Z85828 Personal history of other malignant neoplasm of skin: Secondary | ICD-10-CM | POA: Diagnosis not present

## 2022-05-23 ENCOUNTER — Other Ambulatory Visit: Payer: Self-pay | Admitting: Family Medicine

## 2022-05-26 ENCOUNTER — Other Ambulatory Visit: Payer: Self-pay | Admitting: Family Medicine

## 2022-05-26 ENCOUNTER — Other Ambulatory Visit: Payer: Self-pay | Admitting: Family

## 2022-05-26 DIAGNOSIS — N6320 Unspecified lump in the left breast, unspecified quadrant: Secondary | ICD-10-CM | POA: Diagnosis not present

## 2022-05-30 ENCOUNTER — Ambulatory Visit (INDEPENDENT_AMBULATORY_CARE_PROVIDER_SITE_OTHER): Payer: Medicare Other | Admitting: Family

## 2022-05-30 VITALS — BP 135/71 | HR 51 | Temp 98.0°F | Resp 16 | Wt 127.0 lb

## 2022-05-30 DIAGNOSIS — M542 Cervicalgia: Secondary | ICD-10-CM

## 2022-05-30 DIAGNOSIS — K5792 Diverticulitis of intestine, part unspecified, without perforation or abscess without bleeding: Secondary | ICD-10-CM | POA: Diagnosis not present

## 2022-05-30 NOTE — Progress Notes (Signed)
Subjective:   By signing my name below, I, [name], attest that this documentation has been prepared under the direction and in the presence of [name], NP.  05/30/2022.   Patient ID: Carrie [name], female    DOB: 06-08-1945, 77 y.o.   MRN: [id number]  Chief Complaint  Patient presents with   Diverticulitis    Here for follow up, on and off LLQ abdominal pressure   Hypertension    Here for follow up    HPI Patient is in today for an office visit.  Diverticulitis:  She reports that since her last visit on 2/13, she was confirmed to have acute diverticulitis. She had started the Cipro and Flagyl, but then developed an allergic reaction with hives and feeling awful. She is not certain which was the culprit medication; she had been on both in the past. She was subsequently switched to amoxicillin which resolved her symptoms. On 03/31/22 she called the office and reported another flare-up of diverticulitis. She was again prescribed amoxicillin which she filled but has not started since her symptoms subsided after a brief time. This was the third flare-up of diverticulitis within the past 5 years.  LLQ Pain:  Lately she has been feeling much better overall. However, on occasion she can tell "that something is there" in her lower left abdomen which she describes as a pressure or discomfort. She is non-tender on exam today. She does follow with GI. Currently she is awaiting assignment to a new doctor as her previous provider retired.  Neck pain:  Additionally she complains of pain and burning/tingling sensations of her dorsal neck, with inferior radiation to her right shoulder/upper back. She has experienced some limited ROM when turning her head to the right.   Skin rash:  Currently she is taking an antibiotic due to a recent erythematous rash of her left breast. She was concerned about inflammatory breast cancer and followed up with her gynecologist who reportedly felt she had  either a cyst or infection. On 5/2 she is scheduled for a diagnostic ultrasound. At this time, she reports that her skin rash has resolved, with no more erythema or muscle knot.   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   NEOPLASM, MALIGNANT, BREAST, HX OF 03/03/2010   OSTEOPENIA 03/03/2010   PALPITATIONS, HX OF 03/03/2010   Pulmonary nodules 01/07/2015   Rib fracture    left, 3 fractures s/p radiation: right side 2 fractured, no surgery    Past Surgical History:  Procedure Laterality Date   BREAST LUMPECTOMY WITH NEEDLE LOCALIZATION AND AXILLARY SENTINEL LYMPH NODE BX Left 08/2004   clavicle dislocate  77 yrs old   right, reapproximated w/pins, subsequently removed   HERNIA REPAIR  05/2012   left hip traumatic history     hematoma surgically evacuated   SHOULDER ARTHROSCOPY     b/l shoulder with debridement   TONSILLECTOMY      Family History  Problem Relation Age of Onset   Hypertension Mother    COPD Mother  Alcohol abuse Father        history of   Coronary artery disease Father    Osteoarthritis Brother        knees   Basal cell carcinoma Sister    Heart disease Maternal Grandmother    Stroke Maternal Grandfather    Osteoarthritis Sister        knees   Migraines Sister     Social History   Socioeconomic History   Marital status: Married    Spouse name: Not on file   Number of children: Not on file   Years of education: Not on file   Highest education level: Associate degree: academic program  Occupational History    Not on file  Tobacco Use   Smoking status: Former    Packs/day: .25    Types: Cigarettes    Quit date: 02/07/1968    Years since quitting: 54.3   Smokeless tobacco: Never  Vaping Use   Vaping Use: Never used  Substance and Sexual Activity   Alcohol use: Yes    Comment: weekly   Drug use: No   Sexual activity: Not Currently    Birth control/protection: Post-menopausal  Other Topics Concern   Not on file  Social History Narrative   Not on file   Social Determinants of Health   Financial Resource Strain: Low Risk  (05/29/2022)   Overall Financial Resource Strain (CARDIA)    Difficulty of Paying Living Expenses: Not hard at all  Food Insecurity: No Food Insecurity (05/29/2022)   Hunger Vital Sign    Worried About Running Out of Food in the Last Year: Never true    Ran Out of Food in the Last Year: Never true  Transportation Needs: No Transportation Needs (05/29/2022)   PRAPARE - Administrator, Civil Service (Medical): No    Lack of Transportation (Non-Medical): No  Physical Activity: Sufficiently Active (05/29/2022)   Exercise Vital Sign    Days of Exercise per Week: 4 days    Minutes of Exercise per Session: 60 min  Stress: No Stress Concern Present (05/29/2022)   [employer] of Occupational Health - Occupational Stress Questionnaire    Feeling of Stress : Only a little  Social Connections: Socially Integrated (05/29/2022)   Social Connection and Isolation Panel [NHANES]    Frequency of Communication with Friends and Family: More than three times a week    Frequency of Social Gatherings with Friends and Family: More than three times a week    Attends Religious Services: More than 4 times per year    Active Member of [employer] or Organizations: Yes    Attends Engineer, structural: More than 4 times per year    Marital Status: Married  Catering manager Violence: Not At Risk (08/23/2021)   Humiliation, Afraid, Rape, and Kick questionnaire    Fear of  Current or Ex-Partner: No    Emotionally Abused: No    Physically Abused: No    Sexually Abused: No    Outpatient Medications Prior to Visit  Medication Sig Dispense Refill   Ascorbic Acid (VITAMIN C) 1000 MG tablet Take 1,000 mg by mouth daily.     Calcium-Vitamin D-Vitamin K (CALCIUM SOFT CHEWS PO) Take 1 tablet by mouth 2 (two) times daily.     Efinaconazole 10 % SOLN Apply 1 drop topically daily. 4 mL 11   EPINEPHrine (EPIPEN 2-PAK) 0.3 mg/0.3 mL IJ SOAJ injection Inject 0.3 mg into the muscle as needed for anaphylaxis. 1 each  2   famotidine (PEPCID) 40 MG tablet Take by mouth.     glucosamine-chondroitin 500-400 MG tablet Take 1 tablet by mouth daily.     Lactobacillus (PROBIOTIC ACIDOPHILUS PO) Take by mouth.     metoprolol succinate (TOPROL-XL) 50 MG 24 hr tablet Take 1 tablet (50 mg total) by mouth daily. Take with or immediately following a meal 90 tablet 0   Vitamins-Lipotropics (B-50) TABS Take 1 tablet by mouth daily.     zolpidem (AMBIEN) 5 MG tablet Take 1 tablet (5 mg total) by mouth at bedtime as needed for sleep. 30 tablet 1   No facility-administered medications prior to visit.    Allergies  Allergen Reactions   Hydrocodone-Acetaminophen Other (See Comments)    Slows Respiration Rate Down   Ciprofloxacin     ? Allergy was on cpro/flagyl.   Codeine     REACTION: upsets stomach   Demerol     hives   Doxycycline Hives   Meperidine Hcl     REACTION: Hives   Metronidazole     Was either cipro or flagyl- had hives with one of them.     Review of Systems  Gastrointestinal:        +Chronic LLQ pressure/discomfort  Musculoskeletal:  Positive for neck pain.    See HPI.     Objective:    Physical Exam Constitutional:      Appearance: Normal appearance.  HENT:     Head: Normocephalic and atraumatic.     Right Ear: Tympanic membrane, ear canal and external ear normal.     Left Ear: Tympanic membrane, ear canal and external ear normal.  Eyes:      Extraocular Movements: Extraocular movements intact.     Pupils: Pupils are equal, round, and reactive to light.  Cardiovascular:     Rate and Rhythm: Normal rate and regular rhythm.     Heart sounds: Normal heart sounds. No murmur heard.    No gallop.  Pulmonary:     Effort: Pulmonary effort is normal. No respiratory distress.     Breath sounds: Normal breath sounds. No wheezing or rales.  Abdominal:     Palpations: Abdomen is soft.     Tenderness: There is no abdominal tenderness.  Skin:    General: Skin is warm and dry.  Neurological:     General: No focal deficit present.     Mental Status: She is alert and oriented to person, place, and time.  Psychiatric:        Mood and Affect: Mood normal.        Behavior: Behavior normal.     BP 135/71 (BP Location: Right Arm, Patient Position: Sitting, Cuff Size: Small)   Pulse (!) 51   Temp 98 F (36.7 C) (Oral)   Resp 16   Wt 127 lb (57.6 kg)   SpO2 100%   BMI 19.89 kg/m  Wt Readings from Last 3 Encounters:  05/30/22 127 lb (57.6 kg)  03/21/22 127 lb (57.6 kg)  02/27/22 130 lb (59 kg)       Assessment & Plan:   Problem List Items Addressed This Visit       Unprioritized   Neck pain    New. Discussed referral to PT but patient declines. Instead will opt for tylenol, heat and stretching.       Diverticulitis - Primary    Clinically resolved.  This is her 3rd episode of diverticulitis since around 2020.  I will arrange follow up with  GI.       Relevant Orders   Ambulatory referral to Gastroenterology   20 minutes spent on today's visit. Time was spent examining patient, reviewing medical record and counseling on diverticulitis/musculoskeletal neck pain.   No orders of the defined types were placed in this encounter.   I, Lemont Fillers, NP, personally preformed the services described in this documentation.  All medical record entries made by the scribe were at my direction and in my presence.  I have  reviewed the chart and discharge instructions (if applicable) and agree that the record reflects my personal performance and is accurate and complete. 05/30/2022.  I,Mathew Stumpf,acting as a Neurosurgeon for [employer], NP.,have documented all relevant documentation on the behalf of Lemont Fillers, NP,as directed by  Lemont Fillers, NP while in the presence of Lemont Fillers, NP.   Lemont Fillers, NP

## 2022-05-30 NOTE — Assessment & Plan Note (Signed)
Clinically resolved.  This is her 3rd episode of diverticulitis since around 2020.  I will arrange follow up with GI.

## 2022-05-31 DIAGNOSIS — M542 Cervicalgia: Secondary | ICD-10-CM | POA: Insufficient documentation

## 2022-05-31 NOTE — Assessment & Plan Note (Signed)
New. Discussed referral to PT but patient declines. Instead will opt for tylenol, heat and stretching.

## 2022-06-08 ENCOUNTER — Ambulatory Visit: Payer: Medicare Other

## 2022-06-08 ENCOUNTER — Ambulatory Visit
Admission: RE | Admit: 2022-06-08 | Discharge: 2022-06-08 | Disposition: A | Discharge status: Home / Self Care | Payer: Medicare Other | Source: Ambulatory Visit | Attending: Family | Admitting: Family

## 2022-06-08 DIAGNOSIS — Z1231 Encounter for screening mammogram for malignant neoplasm of breast: Secondary | ICD-10-CM | POA: Diagnosis not present

## 2022-06-08 DIAGNOSIS — Z853 Personal history of malignant neoplasm of breast: Secondary | ICD-10-CM | POA: Diagnosis not present

## 2022-06-08 DIAGNOSIS — N63 Unspecified lump in unspecified breast: Secondary | ICD-10-CM | POA: Diagnosis not present

## 2022-06-08 DIAGNOSIS — N6320 Unspecified lump in the left breast, unspecified quadrant: Secondary | ICD-10-CM

## 2022-06-13 DIAGNOSIS — D225 Melanocytic nevi of trunk: Secondary | ICD-10-CM | POA: Diagnosis not present

## 2022-06-13 DIAGNOSIS — Z85828 Personal history of other malignant neoplasm of skin: Secondary | ICD-10-CM | POA: Diagnosis not present

## 2022-06-13 DIAGNOSIS — L570 Actinic keratosis: Secondary | ICD-10-CM | POA: Diagnosis not present

## 2022-06-13 DIAGNOSIS — L814 Other melanin hyperpigmentation: Secondary | ICD-10-CM | POA: Diagnosis not present

## 2022-06-13 DIAGNOSIS — L905 Scar conditions and fibrosis of skin: Secondary | ICD-10-CM | POA: Diagnosis not present

## 2022-06-13 DIAGNOSIS — Z85820 Personal history of malignant melanoma of skin: Secondary | ICD-10-CM | POA: Diagnosis not present

## 2022-06-13 DIAGNOSIS — L821 Other seborrheic keratosis: Secondary | ICD-10-CM | POA: Diagnosis not present

## 2022-06-22 DIAGNOSIS — K5792 Diverticulitis of intestine, part unspecified, without perforation or abscess without bleeding: Secondary | ICD-10-CM | POA: Diagnosis not present

## 2022-08-17 DIAGNOSIS — R059 Cough, unspecified: Secondary | ICD-10-CM | POA: Diagnosis not present

## 2022-08-17 DIAGNOSIS — J040 Acute laryngitis: Secondary | ICD-10-CM | POA: Diagnosis not present

## 2022-08-19 ENCOUNTER — Other Ambulatory Visit: Payer: Self-pay | Admitting: Family Medicine

## 2022-08-21 DIAGNOSIS — Z01419 Encounter for gynecological examination (general) (routine) without abnormal findings: Secondary | ICD-10-CM | POA: Diagnosis not present

## 2022-08-21 DIAGNOSIS — M8580 Other specified disorders of bone density and structure, unspecified site: Secondary | ICD-10-CM | POA: Diagnosis not present

## 2022-08-21 DIAGNOSIS — Z6821 Body mass index (BMI) 21.0-21.9, adult: Secondary | ICD-10-CM | POA: Diagnosis not present

## 2022-08-21 DIAGNOSIS — N952 Postmenopausal atrophic vaginitis: Secondary | ICD-10-CM | POA: Diagnosis not present

## 2022-08-24 ENCOUNTER — Encounter: Payer: Self-pay | Admitting: Emergency Medicine

## 2022-08-24 DIAGNOSIS — M8580 Other specified disorders of bone density and structure, unspecified site: Secondary | ICD-10-CM | POA: Insufficient documentation

## 2022-08-29 ENCOUNTER — Ambulatory Visit (INDEPENDENT_AMBULATORY_CARE_PROVIDER_SITE_OTHER): Payer: Medicare Other | Admitting: Emergency Medicine

## 2022-08-29 VITALS — Ht 65.0 in | Wt 127.0 lb

## 2022-08-29 DIAGNOSIS — Z0000 Encounter for general adult medical examination without abnormal findings: Secondary | ICD-10-CM

## 2022-08-29 NOTE — Patient Instructions (Signed)
Carrie [name] , Thank you for taking time to come for your Medicare Wellness Visit. I appreciate your ongoing commitment to your health goals. Please review the following plan we discussed and let me know if I can assist you in the future.   These are the goals we discussed:  Goals       Patient Stated (pt-stated)      Maintain diet and exercise that she is currently doing        This is a list of the screening recommended for you and due dates:  Health Maintenance  Topic Date Due   DTaP/Tdap/Td vaccine (2 - Td or Tdap) 06/20/2021   COVID-19 Vaccine (11 - 2023-24 season) 09/01/2022*   Flu Shot  09/07/2022   Mammogram  06/08/2023   DEXA scan (bone density measurement)  06/24/2023   Medicare Annual Wellness Visit  08/29/2023   Colon Cancer Screening  04/27/2031   Pneumonia Vaccine  Completed   Hepatitis C Screening  Completed   Zoster (Shingles) Vaccine  Completed   HPV Vaccine  Aged Out  *Topic was postponed. The date shown is not the original due date.    Advanced directives: on file  Conditions/risks identified: Get your tetanus shot at your local pharmacy. Keep up the good work!  Next appointment: Follow up in one year for your annual wellness visit 08/31/23 @ 1:40pm   Preventive Care 65 Years and Older, Female Preventive care refers to lifestyle choices and visits with your health care provider that can promote health and wellness. What does preventive care include? A yearly physical exam. This is also called an annual well check. Dental exams once or twice a year. Routine eye exams. Ask your health care provider how often you should have your eyes checked. Personal lifestyle choices, including: Daily care of your teeth and gums. Regular physical activity. Eating a healthy diet. Avoiding tobacco and drug use. Limiting alcohol use. Practicing safe sex. Taking low-dose aspirin every day. Taking vitamin and mineral supplements as recommended by your health care  provider. What happens during an annual well check? The services and screenings done by your health care provider during your annual well check will depend on your age, overall health, lifestyle risk factors, and family history of disease. Counseling  Your health care provider may ask you questions about your: Alcohol use. Tobacco use. Drug use. Emotional well-being. Home and relationship well-being. Sexual activity. Eating habits. History of falls. Memory and ability to understand (cognition). Work and work Astronomer. Reproductive health. Screening  You may have the following tests or measurements: Height, weight, and BMI. Blood pressure. Lipid and cholesterol levels. These may be checked every 5 years, or more frequently if you are over 20 years old. Skin check. Lung cancer screening. You may have this screening every year starting at age 84 if you have a 30-pack-year history of smoking and currently smoke or have quit within the past 15 years. Fecal occult blood test (FOBT) of the stool. You may have this test every year starting at age 54. Flexible sigmoidoscopy or colonoscopy. You may have a sigmoidoscopy every 5 years or a colonoscopy every 10 years starting at age 36. Hepatitis C blood test. Hepatitis B blood test. Sexually transmitted disease (STD) testing. Diabetes screening. This is done by checking your blood sugar (glucose) after you have not eaten for a while (fasting). You may have this done every 1-3 years. Bone density scan. This is done to screen for osteoporosis. You may have this done  starting at age 61. Mammogram. This may be done every 1-2 years. Talk to your health care provider about how often you should have regular mammograms. Talk with your health care provider about your test results, treatment options, and if necessary, the need for more tests. Vaccines  Your health care provider may recommend certain vaccines, such as: Influenza vaccine. This is  recommended every year. Tetanus, diphtheria, and acellular pertussis (Tdap, Td) vaccine. You may need a Td booster every 10 years. Zoster vaccine. You may need this after age 40. Pneumococcal 13-valent conjugate (PCV13) vaccine. One dose is recommended after age 54. Pneumococcal polysaccharide (PPSV23) vaccine. One dose is recommended after age 14. Talk to your health care provider about which screenings and vaccines you need and how often you need them. This information is not intended to replace advice given to you by your health care provider. Make sure you discuss any questions you have with your health care provider. Document Released: 02/19/2015 Document Revised: 10/13/2015 Document Reviewed: 11/24/2014 Elsevier Interactive Patient Education  2017 [employer].  Fall Prevention in the Home Falls can cause injuries. They can happen to people of all ages. There are many things you can do to make your home safe and to help prevent falls. What can I do on the outside of my home? Regularly fix the edges of walkways and driveways and fix any cracks. Remove anything that might make you trip as you walk through a door, such as a raised step or threshold. Trim any bushes or trees on the path to your home. Use bright outdoor lighting. Clear any walking paths of anything that might make someone trip, such as rocks or tools. Regularly check to see if handrails are loose or broken. Make sure that both sides of any steps have handrails. Any raised decks and porches should have guardrails on the edges. Have any leaves, snow, or ice cleared regularly. Use sand or salt on walking paths during winter. Clean up any spills in your garage right away. This includes oil or grease spills. What can I do in the bathroom? Use night lights. Install grab bars by the toilet and in the tub and shower. Do not use towel bars as grab bars. Use non-skid mats or decals in the tub or shower. If you need to sit down in  the shower, use a plastic, non-slip stool. Keep the floor dry. Clean up any water that spills on the floor as soon as it happens. Remove soap buildup in the tub or shower regularly. Attach bath mats securely with double-sided non-slip rug tape. Do not have throw rugs and other things on the floor that can make you trip. What can I do in the bedroom? Use night lights. Make sure that you have a light by your bed that is easy to reach. Do not use any sheets or blankets that are too big for your bed. They should not hang down onto the floor. Have a firm chair that has side arms. You can use this for support while you get dressed. Do not have throw rugs and other things on the floor that can make you trip. What can I do in the kitchen? Clean up any spills right away. Avoid walking on wet floors. Keep items that you use a lot in easy-to-reach places. If you need to reach something above you, use a strong step stool that has a grab bar. Keep electrical cords out of the way. Do not use floor polish or wax that  makes floors slippery. If you must use wax, use non-skid floor wax. Do not have throw rugs and other things on the floor that can make you trip. What can I do with my stairs? Do not leave any items on the stairs. Make sure that there are handrails on both sides of the stairs and use them. Fix handrails that are broken or loose. Make sure that handrails are as long as the stairways. Check any carpeting to make sure that it is firmly attached to the stairs. Fix any carpet that is loose or worn. Avoid having throw rugs at the top or bottom of the stairs. If you do have throw rugs, attach them to the floor with carpet tape. Make sure that you have a light switch at the top of the stairs and the bottom of the stairs. If you do not have them, ask someone to add them for you. What else can I do to help prevent falls? Wear shoes that: Do not have high heels. Have rubber bottoms. Are comfortable  and fit you well. Are closed at the toe. Do not wear sandals. If you use a stepladder: Make sure that it is fully opened. Do not climb a closed stepladder. Make sure that both sides of the stepladder are locked into place. Ask someone to hold it for you, if possible. Clearly mark and make sure that you can see: Any grab bars or handrails. First and last steps. Where the edge of each step is. Use tools that help you move around (mobility aids) if they are needed. These include: Canes. Walkers. Scooters. Crutches. Turn on the lights when you go into a dark area. Replace any light bulbs as soon as they burn out. Set up your furniture so you have a clear path. Avoid moving your furniture around. If any of your floors are uneven, fix them. If there are any pets around you, be aware of where they are. Review your medicines with your doctor. Some medicines can make you feel dizzy. This can increase your chance of falling. Ask your doctor what other things that you can do to help prevent falls. This information is not intended to replace advice given to you by your health care provider. Make sure you discuss any questions you have with your health care provider. Document Released: 11/19/2008 Document Revised: 07/01/2015 Document Reviewed: 02/27/2014 Elsevier Interactive Patient Education  2017 [employer].

## 2022-08-29 NOTE — Progress Notes (Signed)
Subjective:  Per patient no change in vitals since last visit; unable to obtain new vitals due to this being a telehealth visit.  Patient was unable to self-report vital signs via telehealth due to a lack of equipment at home.    Carrie [name] is a 77 y.o. female who presents for Medicare Annual (Subsequent) preventive examination.  Visit Complete: Virtual  I connected with  Carrie [name] on 08/29/22 by a audio enabled telemedicine application and verified that I am speaking with the correct person using two identifiers.  Patient Location: Home  Provider Location: Home Office  I discussed the limitations of evaluation and management by telemedicine. The patient expressed understanding and agreed to proceed.  Patient Medicare AWV questionnaire was completed by the patient on 08/22/22; I have confirmed that all information answered by patient is correct and no changes since this date.  Review of Systems     Cardiac Risk Factors include: advanced age (>87men, >82 women);dyslipidemia;hypertension     Objective:    Today's Vitals   08/29/22 1424  Weight: 127 lb (57.6 kg)  Height: 5\' 5"  (1.651 m)   Body mass index is 21.13 kg/m.     08/29/2022    2:42 PM 08/23/2021    1:06 PM 08/17/2020    3:06 PM 03/03/2020    4:13 PM 03/31/2019    1:57 PM 03/19/2018    2:28 PM 01/23/2017    4:10 PM  Advanced Directives  Does Patient Have a Medical Advance Directive? Yes Yes Yes Yes Yes Yes Yes;No  Type of Estate agent of [place];Living will Healthcare Power of [employer] of [place];Living will  Healthcare Power of [place];Living will Healthcare Power of [place];Living will Healthcare Power of [place];Living will  Does patient want to make changes to medical advance directive? No - Patient declined    No - Patient declined No - Patient declined   Copy of Healthcare Power of Attorney in Chart? Yes - validated most recent copy scanned in chart (See  row information) Yes - validated most recent copy scanned in chart (See row information) No - copy requested  No - copy requested No - copy requested No - copy requested    Current Medications (verified) Outpatient Encounter Medications as of 08/29/2022  Medication Sig   Ascorbic Acid (VITAMIN C) 1000 MG tablet Take 1,000 mg by mouth daily.   Calcium-Vitamin D-Vitamin K (CALCIUM SOFT CHEWS PO) Take 1 tablet by mouth 2 (two) times daily.   Cholecalciferol (VITAMIN D3) 250 MCG (10000 UT) capsule Take 10,000 Units by mouth daily.   EPINEPHrine (EPIPEN 2-PAK) 0.3 mg/0.3 mL IJ SOAJ injection Inject 0.3 mg into the muscle as needed for anaphylaxis.   famotidine (PEPCID) 40 MG tablet Take by mouth.   glucosamine-chondroitin 500-400 MG tablet Take 1 tablet by mouth daily.   Krill Oil 1000 MG CAPS Take 1 capsule by mouth 2 (two) times daily.   Lactobacillus (PROBIOTIC ACIDOPHILUS PO) Take by mouth.   metoprolol succinate (TOPROL-XL) 50 MG 24 hr tablet TAKE 1 TABLET BY MOUTH DAILY. TAKE WITH OR IMMEDIATELY FOLLOWING A MEAL.   Vitamins-Lipotropics (B-50) TABS Take 1 tablet by mouth daily.   zolpidem (AMBIEN) 5 MG tablet Take 1 tablet (5 mg total) by mouth at bedtime as needed for sleep.   Efinaconazole 10 % SOLN Apply 1 drop topically daily. (Patient not taking: Reported on 08/29/2022)   No facility-administered encounter medications on file as of 08/29/2022.    Allergies (verified) Hydrocodone-acetaminophen,  Ciprofloxacin, Codeine, Demerol, Doxycycline, Hydrocodone, Meperidine hcl, and Metronidazole   History: Past Medical History:  Diagnosis Date   Breast cancer (HCC) 08/2004   Left breast invasive ductal carcinoma   CHICKENPOX, HX OF 03/03/2010   COMMON MIGRAINE 03/03/2010   DEGENERATIVE DISC DISEASE 03/03/2010   Essential hypertension, benign 03/03/2010   Foot pain, bilateral 02/08/2016   HYPOKALEMIA 03/03/2010   Increased vitamin B12 level 01/10/2015   Insomnia 08/01/2016   Low back pain 08/01/2016    MEASLES, HX OF 03/03/2010   Melanoma of skin, site unspecified 03/03/2010   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   NEOPLASM, MALIGNANT, BREAST, HX OF 03/03/2010   OSTEOPENIA 03/03/2010   PALPITATIONS, HX OF 03/03/2010   Pulmonary nodules 01/07/2015   Rib fracture    left, 3 fractures s/p radiation: right side 2 fractured, no surgery   Past Surgical History:  Procedure Laterality Date   BREAST LUMPECTOMY WITH NEEDLE LOCALIZATION AND AXILLARY SENTINEL LYMPH NODE BX Left 08/2004   clavicle dislocate  77 yrs old   right, reapproximated w/pins, subsequently removed   HERNIA REPAIR  05/2012   left hip traumatic history     hematoma surgically evacuated   SHOULDER ARTHROSCOPY     b/l shoulder with debridement   TONSILLECTOMY     Family History  Problem Relation Age of Onset   Hypertension Mother    COPD Mother    Alcohol abuse Father        history of   Coronary artery disease Father    Osteoarthritis Brother        knees   Basal cell carcinoma Sister    Heart disease Maternal Grandmother    Stroke Maternal Grandfather    Osteoarthritis Sister        knees   Migraines Sister    Social History   Socioeconomic History   Marital status: Married    Spouse name: Fayrene Fearing   Number of children: 1   Years of education: Not on file   Highest education level: Associate degree: academic program  Occupational History   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  Tobacco Use   Smoking status: Former    Current packs/day: 0.00    Average packs/day: (0.3 ttl pk-yrs)    Types: Cigarettes    Start date: 1970    Quit date: 1980    Years since quitting: 44.5   Smokeless tobacco: Never   Tobacco comments:    Social smoker 1970-1980  Vaping Use   Vaping status: Never Used  Substance and Sexual Activity   Alcohol use: Yes    Alcohol/week: 2.0 standard drinks of alcohol    Types: 2 Glasses of wine per week    Comment: 1 glass twice a week   Drug use: No   Sexual activity: Not  Currently    Birth control/protection: Post-menopausal  Other Topics Concern   Not on file  Social History Narrative   Married to [place], 1 daughter lives in [place], [place]   Social Determinants of Health   Financial Resource Strain: Low Risk  (08/22/2022)   Overall Financial Resource Strain (CARDIA)    Difficulty of Paying Living Expenses: Not hard at all  Food Insecurity: No Food Insecurity (08/22/2022)   Hunger Vital Sign    Worried About Running Out of Food in the Last Year: Never true    Ran Out of Food in the Last Year: Never true  Transportation Needs: No Transportation Needs (08/22/2022)   PRAPARE - Transportation  Lack of Transportation (Medical): No    Lack of Transportation (Non-Medical): No  Physical Activity: Sufficiently Active (08/22/2022)   Exercise Vital Sign    Days of Exercise per Week: 4 days    Minutes of Exercise per Session: 60 min  Stress: No Stress Concern Present (08/22/2022)   [employer] of Occupational Health - Occupational Stress Questionnaire    Feeling of Stress : Only a little  Social Connections: Socially Integrated (08/22/2022)   Social Connection and Isolation Panel [NHANES]    Frequency of Communication with Friends and Family: More than three times a week    Frequency of Social Gatherings with Friends and Family: More than three times a week    Attends Religious Services: More than 4 times per year    Active Member of [employer] or Organizations: Yes    Attends Engineer, structural: More than 4 times per year    Marital Status: Married    Tobacco Counseling Counseling given: Not Answered Tobacco comments: Social smoker 1970-1980   Clinical Intake:  Pre-visit preparation completed: Yes  Pain : No/denies pain     BMI - recorded: 21.13 Nutritional Status: BMI of 19-24  Normal Nutritional Risks: None Diabetes: No  How often do you need to have someone help you when you read instructions, pamphlets, or other written materials  from your doctor or pharmacy?: 1 - Never  Interpreter Needed?: No  Information entered by :: Tora Kindred, CMA   Activities of Daily Living    08/22/2022    9:18 AM  In your present state of health, do you have any difficulty performing the following activities:  Hearing? 0  Vision? 0  Difficulty concentrating or making decisions? 0  Walking or climbing stairs? 0  Dressing or bathing? 0  Doing errands, shopping? 0  Preparing Food and eating ? N  Using the Toilet? N  In the past six months, have you accidently leaked urine? Y  Comment has to go quickly to the bathroom, no pad  Do you have problems with loss of bowel control? N  Managing your Medications? N  Managing your Finances? N  Housekeeping or managing your Housekeeping? N    Patient Care Team: Bradd Canary, MD as PCP - General (Family Medicine) Arminda Resides, MD as Consulting Physician (Dermatology) Sharrell Ku, MD as Consulting Physician (Gastroenterology) Pricilla Riffle, MD as Consulting Physician (Cardiology) Mitchel Honour, DO as Consulting Physician (Obstetrics and Gynecology) Ihor Gully, MD (Inactive) as Consulting Physician (Urology)  Indicate any recent Medical Services you may have received from other than [facility] providers in the past year (date may be approximate).     Assessment:   This is a routine wellness examination for Sharpes.  Hearing/Vision screen Hearing Screening - Comments:: Denies hearing loss  Dietary issues and exercise activities discussed:     Goals Addressed               This Visit's Progress     COMPLETED: Healthy Lifestyle        Continue to eat heart healthy diet (full of fruits, vegetables, whole grains, lean protein, water--limit salt, fat, and sugar intake) and increase physical activity as tolerated. Continue doing brain stimulating activities (puzzles, reading, adult coloring books, staying active) to keep memory sharp.        COMPLETED: Patient Stated         Stay healthy       Patient Stated (pt-stated)        Maintain diet  and exercise that she is currently doing      Depression Screen    08/29/2022    2:40 PM 02/27/2022    3:07 PM 08/23/2021    1:05 PM 08/23/2020   10:28 AM 08/17/2020    3:09 PM 05/11/2020    9:12 AM 03/31/2019    2:01 PM  PHQ 2/9 Scores  PHQ - 2 Score 0 0 0 0 0 0 0  PHQ- 9 Score 0          Fall Risk    08/22/2022    9:18 AM 02/27/2022    3:06 PM 08/23/2021    1:07 PM 08/23/2020   10:29 AM 08/17/2020    3:08 PM  Fall Risk   Falls in the past year? 0 0 0  0  Number falls in past yr: 0 0 0 0 0  Injury with Fall? 0 0 0 0 0  Risk for fall due to : No Fall Risks  Impaired vision No Fall Risks   Follow up Falls prevention discussed Falls evaluation completed Falls prevention discussed Falls evaluation completed Falls prevention discussed    MEDICARE RISK AT HOME:   TIMED UP AND GO:  Was the test performed?  No    Cognitive Function:    01/21/2016    3:06 PM  MMSE - Mini Mental State Exam  Orientation to time 5  Orientation to Place 5  Registration 3  Attention/ Calculation 5  Recall 3  Language- name 2 objects 2  Language- repeat 1  Language- follow 3 step command 3  Language- read & follow direction 1  Write a sentence 1  Copy design 1  Total score 30        08/29/2022    2:44 PM 08/23/2021    1:08 PM  6CIT Screen  What Year? 0 points 0 points  What month? 0 points 0 points  What time? 0 points 0 points  Count back from 20 0 points 0 points  Months in reverse 2 points 0 points  Repeat phrase 0 points 0 points  Total Score 2 points 0 points    Immunizations Immunization History  Administered Date(s) Administered   Fluad Quad(high Dose 65+) 10/10/2018, 11/01/2021   Influenza Whole 10/07/2009   Influenza, High Dose Seasonal PF 10/31/2017, 11/25/2019, 11/17/2020   Influenza,inj,Quad PF,6+ Mos 01/07/2015   Influenza-Unspecified 11/07/2015, 11/17/2020   PFIZER Comirnaty(Gray Top)Covid-19  Tri-Sucrose Vaccine 06/23/2020   PFIZER(Purple Top)SARS-COV-2 Vaccination 03/21/2019, 04/15/2019, 11/16/2019   Pfizer Covid-19 Vaccine Bivalent Booster 19yrs & up 10/25/2020, 11/01/2021   Pneumococcal Conjugate-13 12/16/2013   Pneumococcal Polysaccharide-23 03/19/2018   Tdap 06/21/2011   Unspecified SARS-COV-2 Vaccination 03/21/2019, 04/15/2019, 11/16/2019, 06/23/2020, 10/25/2020   Zoster Recombinant(Shingrix) 04/19/2021, 07/27/2021   Zoster, Live 04/19/2021    TDAP status: Due, Education has been provided regarding the importance of this vaccine. Advised may receive this vaccine at local pharmacy or Health Dept. Aware to provide a copy of the vaccination record if obtained from local pharmacy or Health Dept. Verbalized acceptance and understanding.  Flu Vaccine status: Up to date  Pneumococcal vaccine status: Up to date  Covid-19 vaccine status: Information provided on how to obtain vaccines.   Qualifies for Shingles Vaccine? Yes   Zostavax completed Yes   Shingrix Completed?: Yes  Screening Tests Health Maintenance  Topic Date Due   DTaP/Tdap/Td (2 - Td or Tdap) 06/20/2021   COVID-19 Vaccine (11 - 2023-24 season) 09/01/2022 (Originally 12/27/2021)   INFLUENZA VACCINE  09/07/2022   DEXA  SCAN  06/24/2023   Medicare Annual Wellness (AWV)  08/29/2023   MAMMOGRAM  06/07/2024   Colonoscopy  04/27/2031   Pneumonia Vaccine 47+ Years old  Completed   Hepatitis C Screening  Completed   Zoster Vaccines- Shingrix  Completed   HPV VACCINES  Aged Out    Health Maintenance  Health Maintenance Due  Topic Date Due   DTaP/Tdap/Td (2 - Td or Tdap) 06/20/2021    Colorectal cancer screening: Type of screening: Colonoscopy. Completed 04/26/21. Repeat every 10 years  Mammogram status: Completed 06/08/22. Repeat every year  Bone Density status: Completed 06/23/21. Results reflect: Bone density results: OSTEOPENIA. Repeat every 2 years.  Lung Cancer Screening: (Low Dose CT Chest  recommended if Age 67-80 years, 20 pack-year currently smoking OR have quit w/in 15years.) does not qualify.   Lung Cancer Screening Referral: n/a  Additional Screening:  Hepatitis C Screening: does qualify; Completed 01/07/15   Dental Screening: Recommended annual dental exams for proper oral hygiene    Community Resource Referral / Chronic Care Management: CRR required this visit?  No   CCM required this visit?  No     Plan:     I have personally reviewed and noted the following in the patient's chart:   Medical and social history Use of alcohol, tobacco or illicit drugs  Current medications and supplements including opioid prescriptions. Patient is not currently taking opioid prescriptions. Functional ability and status Nutritional status Physical activity Advanced directives List of other physicians Hospitalizations, surgeries, and ER visits in previous 12 months Vitals Screenings to include cognitive, depression, and falls Referrals and appointments  In addition, I have reviewed and discussed with patient certain preventive protocols, quality metrics, and best practice recommendations. A written personalized care plan for preventive services as well as general preventive health recommendations were provided to patient.     Tora Kindred, [facility]   08/29/2022   After Visit Summary: (MyChart) Due to this being a telephonic visit, the after visit summary with patients personalized plan was offered to patient via MyChart   Nurse Notes:  6 CIT Score - 2 Patient will get a Tdap at her local pharmacy.

## 2022-10-31 DIAGNOSIS — Z23 Encounter for immunization: Secondary | ICD-10-CM | POA: Diagnosis not present

## 2022-11-10 DIAGNOSIS — Z23 Encounter for immunization: Secondary | ICD-10-CM | POA: Diagnosis not present

## 2022-12-07 ENCOUNTER — Other Ambulatory Visit: Payer: Self-pay | Admitting: Family Medicine

## 2023-01-02 DIAGNOSIS — L570 Actinic keratosis: Secondary | ICD-10-CM | POA: Diagnosis not present

## 2023-01-02 DIAGNOSIS — L905 Scar conditions and fibrosis of skin: Secondary | ICD-10-CM | POA: Diagnosis not present

## 2023-01-02 DIAGNOSIS — Z85820 Personal history of malignant melanoma of skin: Secondary | ICD-10-CM | POA: Diagnosis not present

## 2023-01-02 DIAGNOSIS — L814 Other melanin hyperpigmentation: Secondary | ICD-10-CM | POA: Diagnosis not present

## 2023-01-02 DIAGNOSIS — L821 Other seborrheic keratosis: Secondary | ICD-10-CM | POA: Diagnosis not present

## 2023-01-02 DIAGNOSIS — Z85828 Personal history of other malignant neoplasm of skin: Secondary | ICD-10-CM | POA: Diagnosis not present

## 2023-01-02 DIAGNOSIS — D225 Melanocytic nevi of trunk: Secondary | ICD-10-CM | POA: Diagnosis not present

## 2023-02-11 ENCOUNTER — Encounter: Payer: Self-pay | Admitting: Family Medicine

## 2023-03-02 ENCOUNTER — Other Ambulatory Visit: Payer: Self-pay | Admitting: Family Medicine

## 2023-04-23 DIAGNOSIS — K219 Gastro-esophageal reflux disease without esophagitis: Secondary | ICD-10-CM | POA: Diagnosis not present

## 2023-04-23 DIAGNOSIS — R1312 Dysphagia, oropharyngeal phase: Secondary | ICD-10-CM | POA: Diagnosis not present

## 2023-04-23 DIAGNOSIS — R1013 Epigastric pain: Secondary | ICD-10-CM | POA: Diagnosis not present

## 2023-04-25 DIAGNOSIS — R1013 Epigastric pain: Secondary | ICD-10-CM | POA: Diagnosis not present

## 2023-04-26 ENCOUNTER — Ambulatory Visit (INDEPENDENT_AMBULATORY_CARE_PROVIDER_SITE_OTHER): Admitting: Physician Assistant

## 2023-04-26 ENCOUNTER — Encounter: Payer: Self-pay | Admitting: Physician Assistant

## 2023-04-26 ENCOUNTER — Ambulatory Visit: Payer: Self-pay | Admitting: Family Medicine

## 2023-04-26 VITALS — BP 140/69 | HR 54 | Ht 65.0 in | Wt 128.8 lb

## 2023-04-26 DIAGNOSIS — K5792 Diverticulitis of intestine, part unspecified, without perforation or abscess without bleeding: Secondary | ICD-10-CM | POA: Diagnosis not present

## 2023-04-26 MED ORDER — AMOXICILLIN-POT CLAVULANATE 875-125 MG PO TABS
1.0000 | ORAL_TABLET | Freq: Two times a day (BID) | ORAL | 0 refills | Status: AC
Start: 2023-04-26 — End: 2023-05-03

## 2023-04-26 NOTE — Telephone Encounter (Signed)
  Chief Complaint: lower abd pain Symptoms: left lower abd pain Frequency: x 1 day Pertinent Negatives: Patient denies fever, n/v Disposition: [] ED /[] Urgent Care (no appt availability in office) / [x] Appointment(In office/virtual)/ []  [place] [facility]/ [] Home Care/ [] Refused Recommended Disposition /[] [place] [facility]/ []  Follow-up with PCP Additional Notes: Patient called in wanting an Amoxicillin called in for diverticulitis. Patient states that she begin having left lower abd pain yesterday and knows that this is a flare up of her diverticulosis.  Patient states she she has also been having more issues with her GERD and they switched her medication from Pepcid to omeprazole 40mg .   Copied from CRM [id number]. Topic: Clinical - Medical Advice >> Apr 26, 2023  8:03 AM Elizebeth Brooking wrote: Reason for CRM: Patient called in stating she has diverticulitis, wanted to know if she could get an antibiotic sent in  , prefer it to be amoxicillin  as she stated that's the only one that doesn't make her feel funny Reason for Disposition  [1] MODERATE pain (e.g., interferes with normal activities) AND [2] pain comes and goes (cramps) AND [3] present > 24 hours  (Exception: Pain with Vomiting or Diarrhea - see that Guideline.)  Answer Assessment - Initial Assessment Questions 1. LOCATION: "Where does it hurt?"      Lower left abd 2. RADIATION: "Does the pain shoot anywhere else?" (e.g., chest, back)     no 3. ONSET: "When did the pain begin?" (e.g., minutes, hours or days ago)      Last night 4. SUDDEN: "Gradual or sudden onset?"     Sudden onset 5. PATTERN "Does the pain come and go, or is it constant?"    - If it comes and goes: "How long does it last?" "Do you have pain now?"     (Note: Comes and goes means the pain is intermittent. It goes away completely between bouts.)    - If constant: "Is it getting better, staying the same, or getting worse?"      (Note: Constant means the pain never  goes away completely; most serious pain is constant and gets worse.)      Comes and goes depending on position 6. SEVERITY: "How bad is the pain?"  (e.g., Scale 1-10; mild, moderate, or severe)    - MILD (1-3): Doesn't interfere with normal activities, abdomen soft and not tender to touch.     - MODERATE (4-7): Interferes with normal activities or awakens from sleep, abdomen tender to touch.     - SEVERE (8-10): Excruciating pain, doubled over, unable to do any normal activities.       6/10 when strong, right now 3/10 7. RECURRENT SYMPTOM: "Have you ever had this type of stomach pain before?" If Yes, ask: "When was the last time?" and "What happened that time?"      Yes, hx 8. CAUSE: "What do you think is causing the stomach pain?"     diverticulitis 9. RELIEVING/AGGRAVATING FACTORS: "What makes it better or worse?" (e.g., antacids, bending or twisting motion, bowel movement)     Walking too long, certain position 10. OTHER SYMPTOMS: "Do you have any other symptoms?" (e.g., back pain, diarrhea, fever, urination pain, vomiting)       no  Protocols used: Abdominal Pain - Female-A-AH

## 2023-04-26 NOTE — Telephone Encounter (Signed)
 Scheduled to see Thereasa Solo. today

## 2023-04-26 NOTE — Progress Notes (Signed)
 Established patient visit   Patient: Carrie [name]   DOB: 23-Jul-1945   78 y.o. Female  MRN: [id number] Visit Date: 04/26/2023  Today's healthcare provider: Alfredia Ferguson, PA-C   Cc. diverticulitis  Subjective    Pt, with a history of diverticulitis, reports ongoing epigastric pain and worsening reflux 2-3 weeks. She saw GI who switched her to omeprazole, ordered an [place] and endoscopy. [place] results normal, endoscopy pending.  Yesterday, pt started w/ lower LQ pain similar to her diverticulitis pain. Denies urinary symptoms.    Medications: Outpatient Medications Prior to Visit  Medication Sig   Ascorbic Acid (VITAMIN C) 1000 MG tablet Take 1,000 mg by mouth daily.   Calcium-Vitamin D-Vitamin K (CALCIUM SOFT CHEWS PO) Take 1 tablet by mouth 2 (two) times daily.   Cholecalciferol (VITAMIN D3) 250 MCG (10000 UT) capsule Take 10,000 Units by mouth daily.   Efinaconazole 10 % SOLN Apply 1 drop topically daily. (Patient not taking: Reported on 08/29/2022)   EPINEPHrine (EPIPEN 2-PAK) 0.3 mg/0.3 mL IJ SOAJ injection Inject 0.3 mg into the muscle as needed for anaphylaxis.   famotidine (PEPCID) 40 MG tablet TAKE 1 TABLET BY MOUTH EVERY DAY   glucosamine-chondroitin 500-400 MG tablet Take 1 tablet by mouth daily.   Krill Oil 1000 MG CAPS Take 1 capsule by mouth 2 (two) times daily.   Lactobacillus (PROBIOTIC ACIDOPHILUS PO) Take by mouth.   metoprolol succinate (TOPROL-XL) 50 MG 24 hr tablet TAKE 1 TABLET BY MOUTH DAILY. TAKE WITH OR IMMEDIATELY FOLLOWING A MEAL.   Vitamins-Lipotropics (B-50) TABS Take 1 tablet by mouth daily.   zolpidem (AMBIEN) 5 MG tablet Take 1 tablet (5 mg total) by mouth at bedtime as needed for sleep.   No facility-administered medications prior to visit.    Review of Systems  Constitutional:  Negative for fatigue and fever.  Respiratory:  Negative for cough and shortness of breath.   Cardiovascular:  Negative for chest pain and leg swelling.   Gastrointestinal:  Positive for abdominal pain.  Neurological:  Negative for dizziness and headaches.       Objective    BP (!) 140/69   Pulse (!) 54   Ht 5\' 5"  (1.651 m)   Wt 128 lb 12.8 oz (58.4 kg)   BMI 21.43 kg/m    Physical Exam Constitutional:      General: She is awake.     Appearance: She is well-developed.  HENT:     Head: Normocephalic.  Eyes:     Conjunctiva/sclera: Conjunctivae normal.  Cardiovascular:     Rate and Rhythm: Normal rate and regular rhythm.     Heart sounds: Normal heart sounds.  Pulmonary:     Effort: Pulmonary effort is normal.     Breath sounds: Normal breath sounds.  Abdominal:     General: Abdomen is flat.     Palpations: Abdomen is soft.     Tenderness: There is abdominal tenderness in the left lower quadrant.  Skin:    General: Skin is warm.  Neurological:     Mental Status: She is alert and oriented to person, place, and time.  Psychiatric:        Attention and Perception: Attention normal.        Mood and Affect: Mood normal.        Speech: Speech normal.        Behavior: Behavior is cooperative.     No results found for any visits on 04/26/23.  Assessment &  Plan    Diverticulitis -     Amoxicillin-Pot Clavulanate; Take 1 tablet by mouth 2 (two) times daily for 7 days.  Dispense: 14 tablet; Refill: 0   Recommending all liquid diet. D/t age, success w/ abx tx previously, will rx a course of augmentin, but stressed diet changes.  Return if symptoms worsen or fail to improve.       Alfredia Ferguson, PA-C  [facility] [phone] (phone) [phone] (fax)  [facility]

## 2023-05-28 ENCOUNTER — Other Ambulatory Visit: Payer: Self-pay | Admitting: Family Medicine

## 2023-06-21 DIAGNOSIS — Z85828 Personal history of other malignant neoplasm of skin: Secondary | ICD-10-CM | POA: Diagnosis not present

## 2023-06-21 DIAGNOSIS — D485 Neoplasm of uncertain behavior of skin: Secondary | ICD-10-CM | POA: Diagnosis not present

## 2023-06-21 DIAGNOSIS — C44722 Squamous cell carcinoma of skin of right lower limb, including hip: Secondary | ICD-10-CM | POA: Diagnosis not present

## 2023-06-26 DIAGNOSIS — Z1231 Encounter for screening mammogram for malignant neoplasm of breast: Secondary | ICD-10-CM | POA: Diagnosis not present

## 2023-06-26 LAB — HM MAMMOGRAPHY

## 2023-07-04 DIAGNOSIS — L570 Actinic keratosis: Secondary | ICD-10-CM | POA: Diagnosis not present

## 2023-07-04 DIAGNOSIS — Z85828 Personal history of other malignant neoplasm of skin: Secondary | ICD-10-CM | POA: Diagnosis not present

## 2023-07-04 DIAGNOSIS — D225 Melanocytic nevi of trunk: Secondary | ICD-10-CM | POA: Diagnosis not present

## 2023-07-04 DIAGNOSIS — D2272 Melanocytic nevi of left lower limb, including hip: Secondary | ICD-10-CM | POA: Diagnosis not present

## 2023-07-04 DIAGNOSIS — D2271 Melanocytic nevi of right lower limb, including hip: Secondary | ICD-10-CM | POA: Diagnosis not present

## 2023-07-04 DIAGNOSIS — L821 Other seborrheic keratosis: Secondary | ICD-10-CM | POA: Diagnosis not present

## 2023-07-31 DIAGNOSIS — K219 Gastro-esophageal reflux disease without esophagitis: Secondary | ICD-10-CM | POA: Diagnosis not present

## 2023-07-31 DIAGNOSIS — R1312 Dysphagia, oropharyngeal phase: Secondary | ICD-10-CM | POA: Diagnosis not present

## 2023-07-31 DIAGNOSIS — R1013 Epigastric pain: Secondary | ICD-10-CM | POA: Diagnosis not present

## 2023-08-24 ENCOUNTER — Other Ambulatory Visit: Payer: Self-pay | Admitting: Family Medicine

## 2023-08-29 DIAGNOSIS — G4709 Other insomnia: Secondary | ICD-10-CM | POA: Diagnosis not present

## 2023-08-29 DIAGNOSIS — Z01419 Encounter for gynecological examination (general) (routine) without abnormal findings: Secondary | ICD-10-CM | POA: Diagnosis not present

## 2023-08-29 DIAGNOSIS — Z6822 Body mass index (BMI) 22.0-22.9, adult: Secondary | ICD-10-CM | POA: Diagnosis not present

## 2023-08-31 ENCOUNTER — Ambulatory Visit: Payer: Medicare Other | Admitting: *Deleted

## 2023-08-31 ENCOUNTER — Telehealth: Payer: Self-pay | Admitting: *Deleted

## 2023-08-31 VITALS — Ht 65.0 in | Wt 128.0 lb

## 2023-08-31 DIAGNOSIS — Z0000 Encounter for general adult medical examination without abnormal findings: Secondary | ICD-10-CM

## 2023-08-31 MED ORDER — EPINEPHRINE 0.3 MG/0.3ML IJ SOAJ: 0.3000 mg | INTRAMUSCULAR | 2 refills | Status: AC | PRN

## 2023-08-31 NOTE — Progress Notes (Signed)
 Please attest this visit in the absence of patient primary care provider.    Subjective:   Carrie [name] is a 78 y.o. who presents for a Medicare Wellness preventive visit.  As a reminder, Annual Wellness Visits don't include a physical exam, and some assessments may be limited, especially if this visit is performed virtually. We may recommend an in-person follow-up visit with your provider if needed.  Visit Complete: Virtual I connected with  Carrie [name] on 08/31/23 by a audio enabled telemedicine application and verified that I am speaking with the correct person using two identifiers.  Patient Location: Home  Provider Location: Office/Clinic  I discussed the limitations of evaluation and management by telemedicine. The patient expressed understanding and agreed to proceed.  Vital Signs: Because this visit was a virtual/telehealth visit, some criteria may be missing or patient reported. Any vitals not documented were not able to be obtained and vitals that have been documented are patient reported.  VideoDeclined- This patient declined Librarian, academic. Therefore the visit was completed with audio only.  Persons Participating in Visit: Patient.  AWV Questionnaire: No: Patient Medicare AWV questionnaire was not completed prior to this visit.  Cardiac Risk Factors include: advanced age (>60men, >37 women);hypertension;dyslipidemia;Other (see comment), Risk factor comments: PVCs, history of breast cancer     Objective:    Today's Vitals   08/31/23 1336  Weight: 128 lb (58.1 kg)  Height: 5' 5 (1.651 m)   Body mass index is 21.3 kg/m.     08/31/2023    2:08 PM 08/29/2022    2:42 PM 08/23/2021    1:06 PM 08/17/2020    3:06 PM 03/03/2020    4:13 PM 03/31/2019    1:57 PM 03/19/2018    2:28 PM  Advanced Directives  Does Patient Have a Medical Advance Directive? Yes Yes Yes Yes Yes Yes Yes   Type of Advance Directive Living will Healthcare  Power of [place];Living will Healthcare Power of [employer] of [place];Living will  Healthcare Power of [place];Living will Healthcare Power of [place];Living will  Does patient want to make changes to medical advance directive? No - Patient declined No - Patient declined    No - Patient declined No - Patient declined   Copy of Healthcare Power of Attorney in Chart?  Yes - validated most recent copy scanned in chart (See row information) Yes - validated most recent copy scanned in chart (See row information) No - copy requested  No - copy requested No - copy requested      Data saved with a previous flowsheet row definition    Current Medications (verified) Outpatient Encounter Medications as of 08/31/2023  Medication Sig   Ascorbic Acid (VITAMIN C) 1000 MG tablet Take 1,000 mg by mouth daily.   Calcium-Vitamin D -Vitamin K (CALCIUM SOFT CHEWS PO) Take 1 tablet by mouth 2 (two) times daily.   Cholecalciferol (VITAMIN D3) 250 MCG (10000 UT) capsule Take 10,000 Units by mouth daily.   Efinaconazole  10 % SOLN Apply 1 drop topically daily.   famotidine (PEPCID) 40 MG tablet TAKE 1 TABLET BY MOUTH EVERY DAY (Patient taking differently: Take 40 mg by mouth daily. 08/31/23 on hold per GI in [place] and taking Omeprazole in the interim. Will have endoscopy in [place] them f/u with Charmaine Barrack.)   glucosamine-chondroitin 500-400 MG tablet Take 1 tablet by mouth daily.   Krill Oil 1000 MG CAPS Take 1 capsule by mouth 2 (two) times daily.  Lactobacillus (PROBIOTIC ACIDOPHILUS PO) Take by mouth.   metoprolol  succinate (TOPROL -XL) 50 MG 24 hr tablet Take 1 tablet (50 mg total) by mouth daily. Take with or immediately following a meal. Needs appt   omeprazole (PRILOSEC) 40 MG capsule Take 40 mg by mouth daily.   Vitamins-Lipotropics (B-50) TABS Take 1 tablet by mouth daily.   zolpidem  (AMBIEN ) 5 MG tablet Take 1 tablet (5 mg total) by mouth at bedtime as needed for sleep.   [DISCONTINUED]  EPINEPHrine  (EPIPEN  2-PAK) 0.3 mg/0.3 mL IJ SOAJ injection Inject 0.3 mg into the muscle as needed for anaphylaxis.   EPINEPHrine  (EPIPEN  2-PAK) 0.3 mg/0.3 mL IJ SOAJ injection Inject 0.3 mg into the muscle as needed for anaphylaxis.   No facility-administered encounter medications on file as of 08/31/2023.    Allergies (verified) Hydrocodone-acetaminophen, Ciprofloxacin , Codeine, Demerol, Doxycycline , Hydrocodone, and Metronidazole    History: 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   NEOPLASM, MALIGNANT, BREAST, HX OF 03/03/2010   OSTEOPENIA 03/03/2010   PALPITATIONS, HX OF 03/03/2010   Pulmonary nodules 01/07/2015   Rib fracture    left, 3 fractures s/p radiation: right side 2 fractured, no surgery   Past Surgical History:  Procedure Laterality Date   BREAST LUMPECTOMY WITH NEEDLE LOCALIZATION AND AXILLARY SENTINEL LYMPH NODE BX Left 08/2004   clavicle dislocate  78 yrs old   right, reapproximated w/pins, subsequently removed   HERNIA REPAIR  05/2012   left hip traumatic history     hematoma surgically evacuated   SHOULDER ARTHROSCOPY     b/l shoulder with debridement   TONSILLECTOMY     Family History  Problem Relation Age of Onset   Hypertension Mother    COPD Mother    Alcohol abuse Father        history of   Coronary artery disease Father    Osteoarthritis Brother        knees   Basal cell carcinoma Sister    Heart disease Maternal Grandmother    Stroke Maternal Grandfather    Osteoarthritis Sister        knees    Migraines Sister    Social History   Socioeconomic History   Marital status: Married    Spouse name: Lynwood   Number of children: 1   Years of education: Not on file   Highest education level: Associate degree: academic program  Occupational History   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  Tobacco Use   Smoking status: Former    Current packs/day: 0.00    Average packs/day: (0.3 ttl pk-yrs)    Types: Cigarettes    Start date: 1970    Quit date: 1980    Years since quitting: 45.5   Smokeless tobacco: Never   Tobacco comments:    Social smoker 1970-1980  Vaping Use   Vaping status: Never Used  Substance and Sexual Activity   Alcohol use: Yes    Alcohol/week: 2.0 standard drinks of alcohol    Types: 2 Glasses of wine per week  Comment: 1 glass twice a week   Drug use: No    Comment: CBD gummies at night for sleep   Sexual activity: Not Currently    Birth control/protection: Post-menopausal  Other Topics Concern   Not on file  Social History Narrative   Married to [place], 1 daughter lives in [place], [place]   Social Drivers of Health   Financial Resource Strain: Low Risk  (08/31/2023)   Overall Financial Resource Strain (CARDIA)    Difficulty of Paying Living Expenses: Not hard at all  Food Insecurity: No Food Insecurity (08/31/2023)   Hunger Vital Sign    Worried About Running Out of Food in the Last Year: Never true    Ran Out of Food in the Last Year: Never true  Transportation Needs: No Transportation Needs (08/31/2023)   PRAPARE - Administrator, Civil Service (Medical): No    Lack of Transportation (Non-Medical): No  Physical Activity: Sufficiently Active (08/31/2023)   Exercise Vital Sign    Days of Exercise per Week: 6 days    Minutes of Exercise per Session: 60 min  Stress: No Stress Concern Present (08/31/2023)   [employer] of Occupational Health - Occupational Stress Questionnaire    Feeling of Stress: Not at all  Social  Connections: Socially Integrated (08/31/2023)   Social Connection and Isolation Panel    Frequency of Communication with Friends and Family: More than three times a week    Frequency of Social Gatherings with Friends and Family: More than three times a week    Attends Religious Services: More than 4 times per year    Active Member of [employer] or Organizations: Yes    Attends Engineer, structural: More than 4 times per year    Marital Status: Married    Tobacco Counseling Counseling given: Not Answered Tobacco comments: Social smoker 1970-1980    Clinical Intake:  Pre-visit preparation completed: Yes  Pain : No/denies pain     BMI - recorded: 21.3 Nutritional Status: BMI of 19-24  Normal Nutritional Risks: None Diabetes: No  Lab Results  Component Value Date   HGBA1C 5.5 09/25/2017     How often do you need to have someone help you when you read instructions, pamphlets, or other written materials from your doctor or pharmacy?: 1 - Never What is the last grade level you completed in school?: Associate's degree  Interpreter Needed?: No  Information entered by :: Lolita Libra, CMA   Activities of Daily Living     08/31/2023    2:07 PM  In your present state of health, do you have any difficulty performing the following activities:  Hearing? 0  Vision? 0  Difficulty concentrating or making decisions? 0  Walking or climbing stairs? 0  Dressing or bathing? 0  Doing errands, shopping? 0  Preparing Food and eating ? N  Using the Toilet? N  Managing your Medications? N  Managing your Finances? N  Housekeeping or managing your Housekeeping? N    Patient Care Team: Domenica Harlene LABOR, MD as PCP - General (Family Medicine) Joshua Sieving, MD as Consulting Physician (Dermatology) Dannielle Bouchard, DO as Consulting Physician (Obstetrics and Gynecology) Ottelin, Mark, MD (Inactive) as Consulting Physician (Urology) Aniceto Pfeiffer, PA-C as Physician Assistant  (Internal Medicine) [facility] [place]  I have updated your Care Teams any recent Medical Services you may have received from other providers in the past year.     Assessment:   This is a routine wellness  examination for Jerzy.  Hearing/Vision screen Hearing Screening - Comments:: Denies hearing difficulties.  Vision Screening - Comments:: Wears RX glasses -- up to date with routine eye exams.    Goals Addressed   None    Depression Screen     08/31/2023    2:04 PM 08/29/2022    2:40 PM 02/27/2022    3:07 PM 08/23/2021    1:05 PM 08/23/2020   10:28 AM 08/17/2020    3:09 PM 05/11/2020    9:12 AM  PHQ 2/9 Scores  PHQ - 2 Score 0 0 0 0 0 0 0  PHQ- 9 Score 2 0         Fall Risk     08/31/2023    1:57 PM 08/22/2022    9:18 AM 02/27/2022    3:06 PM 08/23/2021    1:07 PM 08/23/2020   10:29 AM  Fall Risk   Falls in the past year? 0 0 0 0   Number falls in past yr: 0 0 0 0 0  Injury with Fall? 0 0 0 0 0  Risk for fall due to : No Fall Risks No Fall Risks  Impaired vision No Fall Risks  Follow up Falls evaluation completed Falls prevention discussed Falls evaluation completed  Falls prevention discussed  Falls evaluation completed      Data saved with a previous flowsheet row definition    MEDICARE RISK AT HOME:  Medicare Risk at Home Any stairs in or around the home?: Yes If so, are there any without handrails?: No Home free of loose throw rugs in walkways, pet beds, electrical cords, etc?: No (has rug in hallway) Adequate lighting in your home to reduce risk of falls?: Yes (has night lights) Life alert?: No Use of a cane, walker or w/c?: No Grab bars in the bathroom?: No Shower chair or bench in shower?: No Elevated toilet seat or a handicapped toilet?: No  TIMED UP AND GO:  Was the test performed?  No,audio  Cognitive Function: 6CIT completed    01/21/2016    3:06 PM  MMSE - Mini Mental State Exam  Orientation to time 5   Orientation to Place 5    Registration 3   Attention/ Calculation 5   Recall 3   Language- name 2 objects 2   Language- repeat 1  Language- follow 3 step command 3   Language- read & follow direction 1   Write a sentence 1   Copy design 1   Total score 30      Data saved with a previous flowsheet row definition        08/29/2022    2:44 PM 08/23/2021    1:08 PM  6CIT Screen  What Year? 0 points 0 points  What month? 0 points 0 points  What time? 0 points 0 points  Count back from 20 0 points 0 points  Months in reverse 2 points 0 points  Repeat phrase 0 points 0 points  Total Score 2 points 0 points    Immunizations Immunization History  Administered Date(s) Administered   Fluad Quad(high Dose 65+) 10/10/2018, 11/01/2021   Influenza Split 11/06/2009   Influenza Whole 10/07/2009   Influenza, High Dose Seasonal PF 10/31/2017, 11/25/2019, 11/17/2020, 11/10/2022   Influenza,inj,Quad PF,6+ Mos 01/07/2015   Influenza-Unspecified 11/07/2015, 11/17/2020   PFIZER Comirnaty(Gray Top)Covid-19 Tri-Sucrose Vaccine 06/23/2020   PFIZER(Purple Top)SARS-COV-2 Vaccination 03/21/2019, 04/15/2019, 11/16/2019   Pfizer Covid-19 Vaccine Bivalent Booster 57yrs & up 10/25/2020, 11/01/2021   Pfizer(Comirnaty)Fall  Seasonal Vaccine 12 years and older 10/31/2022   Pneumococcal Conjugate-13 12/16/2013   Pneumococcal Polysaccharide-23 02/06/2009, 03/19/2018   Tdap 06/21/2011, 10/03/2022   Unspecified SARS-COV-2 Vaccination 03/21/2019, 04/15/2019, 11/16/2019, 06/23/2020, 10/25/2020   Zoster Recombinant(Shingrix) 04/19/2021, 07/27/2021   Zoster, Live 04/19/2021    Screening Tests Health Maintenance  Topic Date Due   MAMMOGRAM  06/08/2023   DEXA SCAN  06/24/2023   COVID-19 Vaccine (12 - Mixed Product risk 2024-25 season) 08/23/2024 (Originally 04/30/2023)   INFLUENZA VACCINE  09/07/2023   Medicare Annual Wellness (AWV)  08/30/2024   Colonoscopy  04/27/2031   DTaP/Tdap/Td (3 - Td or Tdap) 10/02/2032   Pneumococcal  Vaccine: 50+ Years  Completed   Hepatitis C Screening  Completed   Zoster Vaccines- Shingrix  Completed   Hepatitis B Vaccines  Aged Out   HPV VACCINES  Aged Out   Meningococcal B Vaccine  Aged Out    Health Maintenance  Health Maintenance Due  Topic Date Due   MAMMOGRAM  06/08/2023   DEXA SCAN  06/24/2023   Health Maintenance Items Addressed: Mammogram result requested from [facility].  Will call them to schedule DEXA as well.   Additional Screening:  Vision Screening: Recommended annual ophthalmology exams for early detection of glaucoma and other disorders of the eye. Would you like a referral to an eye doctor? No    Dental Screening: Recommended annual dental exams for proper oral hygiene  Community Resource Referral / Chronic Care Management: CRR required this visit?  No   CCM required this visit?  No   Plan:    I have personally reviewed and noted the following in the patient's chart:   Medical and social history Use of alcohol, tobacco or illicit drugs  Current medications and supplements including opioid prescriptions. Patient is not currently taking opioid prescriptions. Functional ability and status Nutritional status Physical activity Advanced directives List of other physicians Hospitalizations, surgeries, and ER visits in previous 12 months Vitals Screenings to include cognitive, depression, and falls Referrals and appointments  In addition, I have reviewed and discussed with patient certain preventive protocols, quality metrics, and best practice recommendations. A written personalized care plan for preventive services as well as general preventive health recommendations were provided to patient.   Lolita Libra, CMA   08/31/2023   After Visit Summary: (MyChart) Due to this being a telephonic visit, the after visit summary with patients personalized plan was offered to patient via MyChart   Notes: See phone note

## 2023-08-31 NOTE — Patient Instructions (Addendum)
 Carrie [name] , Thank you for taking time out of your busy schedule to complete your Annual Wellness Visit with me. I enjoyed our conversation and look forward to speaking with you again next year. I, as well as your care team,  appreciate your ongoing commitment to your health goals. Please review the following plan we discussed and let me know if I can assist you in the future. Your Game plan/ To Do List   Referrals: If you haven't heard from the office you've been referred to, please reach out to them at the phone provided.   Bone Density:  please call [facility] to schedule at [phone]  Follow up Visits: Next Medicare AWV with our clinical staff: 09/03/24 1:40pm    Next Office Visit with your provider: 01/17/24 10:40am. Please call the office for an earlier appointment with Harlene Jolly if you develop any palpitations, shortness of breath, dizziness or paleness in skin color as these could be symptoms of a concerning cause of your fatigue.  Clinician Recommendations:  Aim for 30 minutes of exercise or brisk walking, 6-8 glasses of water, and 5 servings of fruits and vegetables each day.   I sent a refill to your pharmacy for the Epi pen.        This is a list of the screening recommended for you and due dates:  Health Maintenance  Topic Date Due   Mammogram  06/08/2023   DEXA scan (bone density measurement)  06/24/2023   Medicare Annual Wellness Visit  08/29/2023   COVID-19 Vaccine (12 - Mixed Product risk 2024-25 season) 08/23/2024*   Flu Shot  09/07/2023   Colon Cancer Screening  04/27/2031   DTaP/Tdap/Td vaccine (3 - Td or Tdap) 10/02/2032   Pneumococcal Vaccine for age over 45  Completed   Hepatitis C Screening  Completed   Zoster (Shingles) Vaccine  Completed   Hepatitis B Vaccine  Aged Out   HPV Vaccine  Aged Out   Meningitis B Vaccine  Aged Out  *Topic was postponed. The date shown is not the original due date.    See attachments for Preventive Care and  Fall Prevention Tips.

## 2023-08-31 NOTE — Telephone Encounter (Signed)
 Pt had AWV today and reports that she uses zolpidem  so seldom that her supply at home was expired. Her Gyn gave her #30 tablets to keep on hand.  Also notes that once she falls asleep, she has been waking up then having difficulty falling back asleep and has started using CBD gummies.  Is scheduled for EGD on 09/12/23 due to her issues with GERD. GI group has given her omeprazole to take and pt has pepcid on hold. Pt is hoping to go back on Pepcid after the EGD and follow up.  Also reports feeling fatigued and wonders if this is due to her sleep issues.   Pt requests appt with both Dr Domenica and Jessica Yacopino at the same time. She reports anxiety over seeing a new provider and would feel better knowing that all 3 are on the same page with her care. We discussed challenges with this and pt voices understanding but still wants appt with all 3 for her next visit to address her fatigue, sleep and GERD conditions. I scheduled pt to see PCP for first available on 01/17/24 at 10:40am. I blocked Jessica's schedule at the same time in hopes you can see pt together initially to ease her concerns. I also advised pt if there is any worsening of fatigue that she should schedule an appt with Harlene as early as possible to rule out any serious underlying causes and she voices understanding.

## 2023-09-04 NOTE — Telephone Encounter (Signed)
 Patient reports her GYN refilled the medication for her.

## 2023-09-12 DIAGNOSIS — K219 Gastro-esophageal reflux disease without esophagitis: Secondary | ICD-10-CM | POA: Diagnosis not present

## 2023-09-12 DIAGNOSIS — Z853 Personal history of malignant neoplasm of breast: Secondary | ICD-10-CM | POA: Diagnosis not present

## 2023-09-12 DIAGNOSIS — Z79899 Other long term (current) drug therapy: Secondary | ICD-10-CM | POA: Diagnosis not present

## 2023-09-12 DIAGNOSIS — I10 Essential (primary) hypertension: Secondary | ICD-10-CM | POA: Diagnosis not present

## 2023-09-12 DIAGNOSIS — R1013 Epigastric pain: Secondary | ICD-10-CM | POA: Diagnosis not present

## 2023-09-12 DIAGNOSIS — Z87891 Personal history of nicotine dependence: Secondary | ICD-10-CM | POA: Diagnosis not present

## 2023-09-12 DIAGNOSIS — R1312 Dysphagia, oropharyngeal phase: Secondary | ICD-10-CM | POA: Diagnosis not present

## 2023-09-12 DIAGNOSIS — K314 Gastric diverticulum: Secondary | ICD-10-CM | POA: Diagnosis not present

## 2023-09-13 NOTE — Progress Notes (Signed)
 Please attest this visit in the absence of patient primary care provider.    Subjective:   Carrie [name] is a 78 y.o. who presents for a Medicare Wellness preventive visit.  As a reminder, Annual Wellness Visits don't include a physical exam, and some assessments may be limited, especially if this visit is performed virtually. We may recommend an in-person follow-up visit with your provider if needed.  Visit Complete: Virtual I connected with  Carrie [name] on 08/31/23 by a audio enabled telemedicine application and verified that I am speaking with the correct person using two identifiers.  Patient Location: Home  Provider Location: Office/Clinic  I discussed the limitations of evaluation and management by telemedicine. The patient expressed understanding and agreed to proceed.  Vital Signs: Because this visit was a virtual/telehealth visit, some criteria may be missing or patient reported. Any vitals not documented were not able to be obtained and vitals that have been documented are patient reported.  VideoDeclined- This patient declined Librarian, academic. Therefore the visit was completed with audio only.  Persons Participating in Visit: Patient.  AWV Questionnaire: No: Patient Medicare AWV questionnaire was not completed prior to this visit.  Cardiac Risk Factors include: advanced age (>19men, >22 women);hypertension;dyslipidemia;Other (see comment), Risk factor comments: PVCs, history of breast cancer     Objective:    Today's Vitals   08/31/23 1336  Weight: 128 lb (58.1 kg)  Height: 5' 5 (1.651 m)   Body mass index is 21.3 kg/m.     08/31/2023    2:08 PM 08/29/2022    2:42 PM 08/23/2021    1:06 PM 08/17/2020    3:06 PM 03/03/2020    4:13 PM 03/31/2019    1:57 PM 03/19/2018    2:28 PM  Advanced Directives  Does Patient Have a Medical Advance Directive? Yes Yes Yes Yes Yes Yes Yes   Type of Advance Directive Living will Healthcare  Power of [place];Living will Healthcare Power of [employer] of [place];Living will  Healthcare Power of [place];Living will Healthcare Power of [place];Living will  Does patient want to make changes to medical advance directive? No - Patient declined No - Patient declined    No - Patient declined No - Patient declined   Copy of Healthcare Power of Attorney in Chart?  Yes - validated most recent copy scanned in chart (See row information) Yes - validated most recent copy scanned in chart (See row information) No - copy requested  No - copy requested No - copy requested      Data saved with a previous flowsheet row definition    Current Medications (verified) Outpatient Encounter Medications as of 08/31/2023  Medication Sig   Ascorbic Acid (VITAMIN C) 1000 MG tablet Take 1,000 mg by mouth daily.   Calcium-Vitamin D -Vitamin K (CALCIUM SOFT CHEWS PO) Take 1 tablet by mouth 2 (two) times daily.   Cholecalciferol (VITAMIN D3) 250 MCG (10000 UT) capsule Take 10,000 Units by mouth daily.   Efinaconazole  10 % SOLN Apply 1 drop topically daily.   famotidine (PEPCID) 40 MG tablet TAKE 1 TABLET BY MOUTH EVERY DAY (Patient taking differently: Take 40 mg by mouth daily. 08/31/23 on hold per GI in [place] and taking Omeprazole in the interim. Will have endoscopy in [place] them f/u with Charmaine Barrack.)   glucosamine-chondroitin 500-400 MG tablet Take 1 tablet by mouth daily.   Krill Oil 1000 MG CAPS Take 1 capsule by mouth 2 (two) times daily.  Lactobacillus (PROBIOTIC ACIDOPHILUS PO) Take by mouth.   metoprolol  succinate (TOPROL -XL) 50 MG 24 hr tablet Take 1 tablet (50 mg total) by mouth daily. Take with or immediately following a meal. Needs appt   omeprazole (PRILOSEC) 40 MG capsule Take 40 mg by mouth daily.   Vitamins-Lipotropics (B-50) TABS Take 1 tablet by mouth daily.   zolpidem  (AMBIEN ) 5 MG tablet Take 1 tablet (5 mg total) by mouth at bedtime as needed for sleep.   [DISCONTINUED]  EPINEPHrine  (EPIPEN  2-PAK) 0.3 mg/0.3 mL IJ SOAJ injection Inject 0.3 mg into the muscle as needed for anaphylaxis.   EPINEPHrine  (EPIPEN  2-PAK) 0.3 mg/0.3 mL IJ SOAJ injection Inject 0.3 mg into the muscle as needed for anaphylaxis.   No facility-administered encounter medications on file as of 08/31/2023.    Allergies (verified) Hydrocodone-acetaminophen, Ciprofloxacin , Codeine, Demerol, Doxycycline , Hydrocodone, and Metronidazole    History: 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   NEOPLASM, MALIGNANT, BREAST, HX OF 03/03/2010   OSTEOPENIA 03/03/2010   PALPITATIONS, HX OF 03/03/2010   Pulmonary nodules 01/07/2015   Rib fracture    left, 3 fractures s/p radiation: right side 2 fractured, no surgery   Past Surgical History:  Procedure Laterality Date   BREAST LUMPECTOMY WITH NEEDLE LOCALIZATION AND AXILLARY SENTINEL LYMPH NODE BX Left 08/2004   clavicle dislocate  78 yrs old   right, reapproximated w/pins, subsequently removed   HERNIA REPAIR  05/2012   left hip traumatic history     hematoma surgically evacuated   SHOULDER ARTHROSCOPY     b/l shoulder with debridement   TONSILLECTOMY     Family History  Problem Relation Age of Onset   Hypertension Mother    COPD Mother    Alcohol abuse Father        history of   Coronary artery disease Father    Osteoarthritis Brother        knees   Basal cell carcinoma Sister    Heart disease Maternal Grandmother    Stroke Maternal Grandfather    Osteoarthritis Sister        knees    Migraines Sister    Social History   Socioeconomic History   Marital status: Married    Spouse name: Lynwood   Number of children: 1   Years of education: Not on file   Highest education level: Associate degree: academic program  Occupational History   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  Tobacco Use   Smoking status: Former    Current packs/day: 0.00    Average packs/day: (0.3 ttl pk-yrs)    Types: Cigarettes    Start date: 1970    Quit date: 1980    Years since quitting: 45.6   Smokeless tobacco: Never   Tobacco comments:    Social smoker 1970-1980  Vaping Use   Vaping status: Never Used  Substance and Sexual Activity   Alcohol use: Yes    Alcohol/week: 2.0 standard drinks of alcohol    Types: 2 Glasses of wine per week  Comment: 1 glass twice a week   Drug use: No    Comment: CBD gummies at night for sleep   Sexual activity: Not Currently    Birth control/protection: Post-menopausal  Other Topics Concern   Not on file  Social History Narrative   Married to [place], 1 daughter lives in [place], [place]   Social Drivers of Health   Financial Resource Strain: Low Risk  (08/31/2023)   Overall Financial Resource Strain (CARDIA)    Difficulty of Paying Living Expenses: Not hard at all  Food Insecurity: No Food Insecurity (08/31/2023)   Hunger Vital Sign    Worried About Running Out of Food in the Last Year: Never true    Ran Out of Food in the Last Year: Never true  Transportation Needs: No Transportation Needs (08/31/2023)   PRAPARE - Administrator, Civil Service (Medical): No    Lack of Transportation (Non-Medical): No  Physical Activity: Sufficiently Active (08/31/2023)   Exercise Vital Sign    Days of Exercise per Week: 6 days    Minutes of Exercise per Session: 60 min  Stress: No Stress Concern Present (08/31/2023)   [employer] of Occupational Health - Occupational Stress Questionnaire    Feeling of Stress: Not at all  Social  Connections: Socially Integrated (08/31/2023)   Social Connection and Isolation Panel    Frequency of Communication with Friends and Family: More than three times a week    Frequency of Social Gatherings with Friends and Family: More than three times a week    Attends Religious Services: More than 4 times per year    Active Member of [employer] or Organizations: Yes    Attends Engineer, structural: More than 4 times per year    Marital Status: Married    Tobacco Counseling Counseling given: Not Answered Tobacco comments: Social smoker 1970-1980    Clinical Intake:  Pre-visit preparation completed: Yes  Pain : No/denies pain     BMI - recorded: 21.3 Nutritional Status: BMI of 19-24  Normal Nutritional Risks: None Diabetes: No  Lab Results  Component Value Date   HGBA1C 5.5 09/25/2017     How often do you need to have someone help you when you read instructions, pamphlets, or other written materials from your doctor or pharmacy?: 1 - Never What is the last grade level you completed in school?: Associate's degree  Interpreter Needed?: No  Information entered by :: Lolita Libra, CMA   Activities of Daily Living     08/31/2023    2:07 PM  In your present state of health, do you have any difficulty performing the following activities:  Hearing? 0  Vision? 0  Difficulty concentrating or making decisions? 0  Walking or climbing stairs? 0  Dressing or bathing? 0  Doing errands, shopping? 0  Preparing Food and eating ? N  Using the Toilet? N  In the past six months, have you accidently leaked urine? N  Do you have problems with loss of bowel control? N  Managing your Medications? N  Managing your Finances? N  Housekeeping or managing your Housekeeping? N    Patient Care Team: Domenica Harlene LABOR, MD as PCP - General (Family Medicine) Joshua Sieving, MD as Consulting Physician (Dermatology) Dannielle Bouchard, DO as Consulting Physician (Obstetrics and  Gynecology) Ottelin, Mark, MD (Inactive) as Consulting Physician (Urology) Aniceto Pfeiffer, PA-C as Physician Assistant (Internal Medicine) [facility] [place]  I have updated your Care Teams any recent Medical Services  you may have received from other providers in the past year.     Assessment:   This is a routine wellness examination for [place].  Hearing/Vision screen Hearing Screening - Comments:: Denies hearing difficulties.  Vision Screening - Comments:: Wears RX glasses -- up to date with routine eye exams.    Goals Addressed   None    Depression Screen     08/31/2023    2:04 PM 08/29/2022    2:40 PM 02/27/2022    3:07 PM 08/23/2021    1:05 PM 08/23/2020   10:28 AM 08/17/2020    3:09 PM 05/11/2020    9:12 AM  PHQ 2/9 Scores  PHQ - 2 Score 0 0 0 0 0 0 0  PHQ- 9 Score 2 0         Fall Risk     08/31/2023    1:57 PM 08/22/2022    9:18 AM 02/27/2022    3:06 PM 08/23/2021    1:07 PM 08/23/2020   10:29 AM  Fall Risk   Falls in the past year? 0 0 0 0   Number falls in past yr: 0 0 0 0 0  Injury with Fall? 0 0 0 0 0  Risk for fall due to : No Fall Risks No Fall Risks  Impaired vision No Fall Risks  Follow up Falls evaluation completed Falls prevention discussed Falls evaluation completed  Falls prevention discussed  Falls evaluation completed      Data saved with a previous flowsheet row definition    MEDICARE RISK AT HOME:  Medicare Risk at Home Any stairs in or around the home?: Yes If so, are there any without handrails?: No Home free of loose throw rugs in walkways, pet beds, electrical cords, etc?: No (has rug in hallway) Adequate lighting in your home to reduce risk of falls?: Yes (has night lights) Life alert?: No Use of a cane, walker or w/c?: No Grab bars in the bathroom?: No Shower chair or bench in shower?: No Elevated toilet seat or a handicapped toilet?: No  TIMED UP AND GO:  Was the test performed?  No,audio  Cognitive Function: 6CIT  completed    01/21/2016    3:06 PM  MMSE - Mini Mental State Exam  Orientation to time 5   Orientation to Place 5   Registration 3   Attention/ Calculation 5   Recall 3   Language- name 2 objects 2   Language- repeat 1  Language- follow 3 step command 3   Language- read & follow direction 1   Write a sentence 1   Copy design 1   Total score 30      Data saved with a previous flowsheet row definition        09/13/2023    8:25 AM 08/29/2022    2:44 PM 08/23/2021    1:08 PM  6CIT Screen  What Year? 0 points 0 points 0 points  What month? 0 points 0 points 0 points  What time? 0 points 0 points 0 points  Count back from 20 0 points 0 points 0 points  Months in reverse 0 points 2 points 0 points  Repeat phrase 0 points 0 points 0 points  Total Score 0 points 2 points 0 points  6-cit was completed at DOS on 08/31/23 and left off of documentation.  Immunizations Immunization History  Administered Date(s) Administered   Fluad Quad(high Dose 65+) 10/10/2018, 11/01/2021   Influenza Split 11/06/2009   Influenza Whole  10/07/2009   Influenza, High Dose Seasonal PF 10/31/2017, 11/25/2019, 11/17/2020, 11/10/2022   Influenza,inj,Quad PF,6+ Mos 01/07/2015   Influenza-Unspecified 11/07/2015, 11/17/2020   PFIZER Comirnaty(Gray Top)Covid-19 Tri-Sucrose Vaccine 06/23/2020   PFIZER(Purple Top)SARS-COV-2 Vaccination 03/21/2019, 04/15/2019, 11/16/2019   Pfizer Covid-19 Vaccine Bivalent Booster 33yrs & up 10/25/2020, 11/01/2021   Pfizer(Comirnaty)Fall Seasonal Vaccine 12 years and older 10/31/2022   Pneumococcal Conjugate-13 12/16/2013   Pneumococcal Polysaccharide-23 02/06/2009, 03/19/2018   Tdap 06/21/2011, 10/03/2022   Unspecified SARS-COV-2 Vaccination 03/21/2019, 04/15/2019, 11/16/2019, 06/23/2020, 10/25/2020   Zoster Recombinant(Shingrix) 04/19/2021, 07/27/2021   Zoster, Live 04/19/2021    Screening Tests Health Maintenance  Topic Date Due   DEXA SCAN  06/29/2023   INFLUENZA  VACCINE  09/07/2023   COVID-19 Vaccine (12 - Mixed Product risk 2024-25 season) 08/23/2024 (Originally 04/30/2023)   MAMMOGRAM  06/25/2024   Medicare Annual Wellness (AWV)  08/30/2024   DTaP/Tdap/Td (3 - Td or Tdap) 10/02/2032   Pneumococcal Vaccine: 50+ Years  Completed   Hepatitis C Screening  Completed   Zoster Vaccines- Shingrix  Completed   Hepatitis B Vaccines  Aged Out   HPV VACCINES  Aged Out   Meningococcal B Vaccine  Aged Out   Colonoscopy  Discontinued    Health Maintenance  Health Maintenance Due  Topic Date Due   DEXA SCAN  06/29/2023   INFLUENZA VACCINE  09/07/2023   Health Maintenance Items Addressed: [facility]amm[facility].  Will call them to schedule DEXA as well.   Additional Screening:  Vision Screening: Recommended annual ophthalmology exams for early detection of glaucoma and other disorders of the eye. Would you like a referral to an eye doctor? No    Dental Screening: Recommended annual dental exams for proper oral hygiene  Community Resource Referral / Chronic Care Management: CRR required this visit?  No   CCM required this visit?  No   Plan:    I have personally reviewed and noted the following in the patient's chart:   Medical and social history Use of alcohol, tobacco or illicit drugs  Current medications and supplements including opioid prescriptions. Patient is not currently taking opioid prescriptions. Functional ability and status Nutritional status Physical activity Advanced directives List of other physicians Hospitalizations, surgeries, and ER visits in previous 12 months Vitals Screenings to include cognitive, depression, and falls Referrals and appointments  In addition, I have reviewed and discussed with patient certain preventive protocols, quality metrics, and best practice recommendations. A written personalized care plan for preventive services as well as general preventive health  recommendations were provided to patient.   Lolita Libra, CMA   09/13/2023   After Visit Summary: (MyChart) Due to this being a telephonic visit, the after visit summary with patients personalized plan was offered to patient via MyChart   Notes: See phone note

## 2023-09-20 ENCOUNTER — Other Ambulatory Visit: Payer: Self-pay | Admitting: Family Medicine

## 2023-09-27 DIAGNOSIS — N958 Other specified menopausal and perimenopausal disorders: Secondary | ICD-10-CM | POA: Diagnosis not present

## 2023-09-27 DIAGNOSIS — M816 Localized osteoporosis [Lequesne]: Secondary | ICD-10-CM | POA: Diagnosis not present

## 2023-09-27 LAB — HM DEXA SCAN

## 2023-10-29 NOTE — Progress Notes (Unsigned)
   LILLETTE Ileana Collet, PhD, LAT, ATC acting as a scribe for Artist Lloyd, MD.  DARIEN MIGNOGNA is a 78 y.o. female who presents to [facility] today for osteoporosis. Pt was previously seen by Dr. Lloyd in 2023 for bilat shoulder pain. Family hx of osteoporosis.   DEXA scan (date, T-score): 09/27/23: Spine= -2.7, L-FN= -2.4, R-FN= -1.6 Prior treatment: *** History of Hip, Spine, or Wrist Fx: *** Heart disease or stroke: *** Cancer: yes- breast cancer 2006*** Kidney Disease: *** Gastric/Peptic Ulcer: *** Gastric bypass surgery: *** Severe GERD: yes, taking omeprazole*** Hx of seizures: *** Age at Menopause: 78y/o Calcium intake: *** Vitamin D  intake: *** Hormone replacement therapy: *** Smoking history: former smoker*** Alcohol: yes-*** Exercise: yes*** Major dental work in past year: *** Parents with hip/spine fracture: *** Height loss: yes: 66.5 to 64   Pertinent review of systems: ***  Relevant historical information: ***   Exam:  There were no vitals taken for this visit. General: Well Developed, well nourished, and in no acute distress.   MSK: ***    Lab and Radiology Results No results found for this or any previous visit (from the past 72 hours). No results found.     Assessment and Plan: 78 y.o. female with ***   PDMP not reviewed this encounter. No orders of the defined types were placed in this encounter.  No orders of the defined types were placed in this encounter.    Discussed warning signs or symptoms. Please see discharge instructions. Patient expresses understanding.   ***

## 2023-10-30 ENCOUNTER — Encounter: Payer: Self-pay | Admitting: Family Medicine

## 2023-10-30 ENCOUNTER — Ambulatory Visit: Payer: PRIVATE HEALTH INSURANCE | Admitting: Family Medicine

## 2023-10-30 ENCOUNTER — Ambulatory Visit: Payer: Self-pay | Admitting: *Deleted

## 2023-10-30 ENCOUNTER — Telehealth: Payer: Self-pay

## 2023-10-30 VITALS — BP 116/72 | HR 57 | Ht 65.0 in | Wt 130.0 lb

## 2023-10-30 DIAGNOSIS — M85852 Other specified disorders of bone density and structure, left thigh: Secondary | ICD-10-CM

## 2023-10-30 DIAGNOSIS — M810 Age-related osteoporosis without current pathological fracture: Secondary | ICD-10-CM | POA: Diagnosis not present

## 2023-10-30 DIAGNOSIS — R718 Other abnormality of red blood cells: Secondary | ICD-10-CM

## 2023-10-30 DIAGNOSIS — M85851 Other specified disorders of bone density and structure, right thigh: Secondary | ICD-10-CM

## 2023-10-30 DIAGNOSIS — E538 Deficiency of other specified B group vitamins: Secondary | ICD-10-CM | POA: Diagnosis not present

## 2023-10-30 LAB — CBC WITH DIFFERENTIAL/PLATELET
Basophils Absolute: 0 K/uL (ref 0.0–0.1)
Basophils Relative: 0.5 % (ref 0.0–3.0)
Eosinophils Absolute: 0.1 K/uL (ref 0.0–0.7)
Eosinophils Relative: 2.1 % (ref 0.0–5.0)
HCT: 34.3 % — ABNORMAL LOW (ref 36.0–46.0)
Hemoglobin: 11.2 g/dL — ABNORMAL LOW (ref 12.0–15.0)
Lymphocytes Relative: 25.9 % (ref 12.0–46.0)
Lymphs Abs: 1.4 K/uL (ref 0.7–4.0)
MCHC: 32.6 g/dL (ref 30.0–36.0)
MCV: 84.8 fl (ref 78.0–100.0)
Monocytes Absolute: 0.4 K/uL (ref 0.1–1.0)
Monocytes Relative: 7.5 % (ref 3.0–12.0)
Neutro Abs: 3.3 K/uL (ref 1.4–7.7)
Neutrophils Relative %: 64 % (ref 43.0–77.0)
Platelets: 204 K/uL (ref 150.0–400.0)
RBC: 4.04 Mil/uL (ref 3.87–5.11)
RDW: 19.2 % — ABNORMAL HIGH (ref 11.5–15.5)
WBC: 5.2 K/uL (ref 4.0–10.5)

## 2023-10-30 LAB — PHOSPHORUS: Phosphorus: 4 mg/dL (ref 2.3–4.6)

## 2023-10-30 LAB — VITAMIN D 25 HYDROXY (VIT D DEFICIENCY, FRACTURES): VITD: 85.86 ng/mL (ref 30.00–100.00)

## 2023-10-30 LAB — COMPREHENSIVE METABOLIC PANEL WITH GFR
ALT: 15 U/L (ref 0–35)
AST: 21 U/L (ref 0–37)
Albumin: 4.1 g/dL (ref 3.5–5.2)
Alkaline Phosphatase: 217 U/L — ABNORMAL HIGH (ref 39–117)
BUN: 13 mg/dL (ref 6–23)
CO2: 28 meq/L (ref 19–32)
Calcium: 9.1 mg/dL (ref 8.4–10.5)
Chloride: 102 meq/L (ref 96–112)
Creatinine, Ser: 0.74 mg/dL (ref 0.40–1.20)
GFR: 78.74 mL/min (ref >60.00)
Glucose, Bld: 101 mg/dL — ABNORMAL HIGH (ref 70–99)
Potassium: 4.3 meq/L (ref 3.5–5.1)
Sodium: 137 meq/L (ref 135–145)
Total Bilirubin: 0.4 mg/dL (ref 0.2–1.2)
Total Protein: 6.6 g/dL (ref 6.0–8.3)

## 2023-10-30 LAB — MAGNESIUM: Magnesium: 2.1 mg/dL (ref 1.5–2.5)

## 2023-10-30 LAB — VITAMIN B12: Vitamin B-12: 466 pg/mL (ref 211–911)

## 2023-10-30 NOTE — Telephone Encounter (Signed)
 FYI Only or Action Required?: FYI only for provider.  Patient was last seen in primary care on 04/26/2023 by Cyndi Shaver, PA-C.  Called Nurse Triage reporting Dizziness.  Symptoms began yesterday.  Interventions attempted: Rest, hydration, or home remedies.  Symptoms are: gradually improving.  Triage Disposition: See PCP When Office is Open (Within 3 Days)  Patient/caregiver understands and will follow disposition?: Yes   Reason for Disposition  [1] MODERATE dizziness (e.g., vertigo; feels very unsteady, interferes with normal activities) AND [2] has been evaluated by doctor (or NP/PA) for this  Answer Assessment - Initial Assessment Questions 1. DESCRIPTION: Describe your dizziness.     Balance off, spinning 2. VERTIGO: Do you feel like either you or the room is spinning or tilting?      Things moving 3. LIGHTHEADED: Do you feel lightheaded? (e.g., somewhat faint, woozy, weak upon standing)     Today- patient feels slightly nauseated, headache 4. SEVERITY: How bad is it?  Can you walk?     Better than yesterday- balance 5. ONSET:  When did the dizziness begin?     yesterday 6. AGGRAVATING FACTORS: Does anything make it worse? (e.g., standing, change in head position)     Patient has been still since yesterday- husband had to come and get her yesterday- not comfortable driving 7. CAUSE: What do you think is causing the dizziness?     vertigo 8. RECURRENT SYMPTOM: Have you had dizziness before? If Yes, ask: When was the last time? What happened that time?     Yes-it has been a while 9. OTHER SYMPTOMS: Do you have any other symptoms? (e.g., earache, headache, numbness, tinnitus, vomiting, weakness)     Headache, nausea  Protocols used: Dizziness - Vertigo-A-AH  Copied from CRM [id number]. Topic: Clinical - Red Word Triage >> Oct 30, 2023  9:53 AM Dedra NOVAK wrote: [facility] that prompted transfer to Nurse Triage: Pt thinks she had an episode of vertigo  yesterday. She still has a headache and nausea. She is also concerned about stroke. Warm transfer to NT.

## 2023-10-30 NOTE — Patient Instructions (Addendum)
 Thank you for coming in today.   Please get labs today before you leave   Leotis will check the cost of Prolia and give you a call

## 2023-10-30 NOTE — Telephone Encounter (Signed)
 Please check cost of Prolia. Labs done after today's visit.

## 2023-10-31 ENCOUNTER — Ambulatory Visit (HOSPITAL_BASED_OUTPATIENT_CLINIC_OR_DEPARTMENT_OTHER)
Admission: RE | Admit: 2023-10-31 | Discharge: 2023-10-31 | Disposition: A | Discharge status: Home / Self Care | Source: Ambulatory Visit | Attending: Medical | Admitting: Medical

## 2023-10-31 ENCOUNTER — Telehealth: Payer: Self-pay | Admitting: Pharmacy Technician

## 2023-10-31 ENCOUNTER — Ambulatory Visit (INDEPENDENT_AMBULATORY_CARE_PROVIDER_SITE_OTHER): Admitting: Medical

## 2023-10-31 ENCOUNTER — Ambulatory Visit: Payer: Self-pay | Admitting: Medical

## 2023-10-31 VITALS — BP 128/82 | HR 60 | Temp 97.9°F | Resp 15 | Ht 65.0 in | Wt 130.8 lb

## 2023-10-31 DIAGNOSIS — Z23 Encounter for immunization: Secondary | ICD-10-CM

## 2023-10-31 DIAGNOSIS — R739 Hyperglycemia, unspecified: Secondary | ICD-10-CM | POA: Diagnosis not present

## 2023-10-31 DIAGNOSIS — D649 Anemia, unspecified: Secondary | ICD-10-CM

## 2023-10-31 DIAGNOSIS — R42 Dizziness and giddiness: Secondary | ICD-10-CM | POA: Insufficient documentation

## 2023-10-31 DIAGNOSIS — G4489 Other headache syndrome: Secondary | ICD-10-CM | POA: Insufficient documentation

## 2023-10-31 MED ORDER — MECLIZINE HCL 12.5 MG PO TABS: 12.5000 mg | ORAL_TABLET | Freq: Three times a day (TID) | ORAL | 0 refills | Status: AC | PRN

## 2023-10-31 NOTE — Progress Notes (Signed)
 Subjective:    Patient ID: Carrie [name], female    DOB: [birth year], 78 y.o.   MRN: [id number]  HPI  Pt states recently had dizziness type symptoms on Monday initially early on. Happened at [employer]. She felt very off balance. She made it to her car and states it was severe vertigo like symptoms/spinning.  Pt states vertigo like symptoms lasted for about 30 minutes. Rest of day felt queezy and nausea. Eventually by end of day majority of symptoms. Pt did not have vision changes, slurred speech or any gross motor or sensory deficits. Did have ha next day and excedrin migraine.Pt states she has history of migraines in past. Pt denies any light sensitivity or sound sensitivity. Pt tried some migraine specific meds  in past past like triptans but did not like way felt afterwards. No headache presently   Pt states she has had vertigo in the past. She note last time had vertigo was last summer on roller coaster ride at [employer]. Pt never had any Epley manuevers. Pt notes pattern of getting low level ha in past after vertigo events.  Also note another event years ago vertigo first time cause her to fall about 5 years ago. 1st event when fell describes that vertigo lasted for days.         Review of Systems  Constitutional:  Negative for chills, fatigue and fever.  Respiratory:  Negative for cough, chest tightness and wheezing.   Cardiovascular:  Negative for chest pain and palpitations.  Gastrointestinal:  Negative for abdominal pain.  Genitourinary:  Negative for dysuria.  Musculoskeletal:  Negative for back pain.  Skin:  Negative for rash.  Neurological:  Positive for dizziness. Negative for speech difficulty and weakness.       Vertigo describes. See hpi. Non presently but brief queezy light headed on exam  Hematological:  Negative for adenopathy.  Psychiatric/Behavioral:  Negative for behavioral problems and decreased concentration.     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   NEOPLASM, MALIGNANT, BREAST, HX OF 03/03/2010   OSTEOPENIA 03/03/2010   PALPITATIONS, HX OF 03/03/2010   Pulmonary nodules 01/07/2015   Rib fracture    left, 3 fractures s/p radiation: right side 2 fractured, no surgery     Social History   Socioeconomic History   Marital status: Married    Spouse name: Lynwood   Number of children: 1   Years of education: Not on file   Highest education level: Associate degree: occupational, Scientist, product/process development, or vocational program  Occupational History   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  Tobacco Use   Smoking status: Former    Current packs/day: 0.00    Average packs/day: (0.3 ttl pk-yrs)    Types: Cigarettes    Start date: 1970    Quit date: 1980    Years since quitting: 45.7   Smokeless tobacco: Never   Tobacco comments:    Social smoker 1970-1980  Vaping Use   Vaping status: Never Used  Substance and  Sexual Activity   Alcohol use: Yes    Alcohol/week: 2.0 standard drinks of alcohol    Types: 2 Glasses of wine per week    Comment: 1 glass twice a week   Drug use: No    Comment: CBD gummies at night for sleep   Sexual activity: Not Currently    Birth control/protection: Post-menopausal  Other Topics Concern   Not on file  Social History Narrative   Married to [place], 1 daughter lives in [place], [place]   Social Drivers of Health   Financial Resource Strain: Low Risk  (10/31/2023)   Overall Financial Resource Strain (CARDIA)    Difficulty of Paying Living  Expenses: Not hard at all  Food Insecurity: No Food Insecurity (10/31/2023)   Hunger Vital Sign    Worried About Running Out of Food in the Last Year: Never true    Ran Out of Food in the Last Year: Never true  Transportation Needs: No Transportation Needs (10/31/2023)   PRAPARE - Administrator, Civil Service (Medical): No    Lack of Transportation (Non-Medical): No  Physical Activity: Sufficiently Active (10/31/2023)   Exercise Vital Sign    Days of Exercise per Week: 4 days    Minutes of Exercise per Session: 80 min  Stress: Stress Concern Present (10/31/2023)   [employer] of Occupational Health - Occupational Stress Questionnaire    Feeling of Stress: To some extent  Social Connections: Socially Integrated (10/31/2023)   Social Connection and Isolation Panel    Frequency of Communication with Friends and Family: More than three times a week    Frequency of Social Gatherings with Friends and Family: More than three times a week    Attends Religious Services: More than 4 times per year    Active Member of [employer] or Organizations: Yes    Attends Engineer, structural: More than 4 times per year    Marital Status: Married  Catering manager Violence: Not At Risk (08/31/2023)   Humiliation, Afraid, Rape, and Kick questionnaire    Fear of Current or Ex-Partner: No    Emotionally Abused: No    Physically Abused: No    Sexually Abused: No    Past Surgical History:  Procedure Laterality Date   BREAST LUMPECTOMY WITH NEEDLE LOCALIZATION AND AXILLARY SENTINEL LYMPH NODE BX Left 08/2004   clavicle dislocate  78 yrs old   right, reapproximated w/pins, subsequently removed   HERNIA REPAIR  05/2012   left hip traumatic history     hematoma surgically evacuated   SHOULDER ARTHROSCOPY     b/l shoulder with debridement   TONSILLECTOMY      Family History  Problem Relation Age of Onset   Hypertension Mother    COPD Mother    Alcohol abuse Father        history of    Coronary artery disease Father    Osteoarthritis Brother        knees   Basal cell carcinoma Sister    Heart disease Maternal Grandmother    Stroke Maternal Grandfather    Osteoarthritis Sister        knees   Migraines Sister     Allergies  Allergen Reactions   Hydrocodone-Acetaminophen Other (See Comments)    Slows Respiration Rate Down   Ciprofloxacin      ? Allergy was on cpro/flagyl .   Codeine     REACTION: upsets stomach   Demerol     hives   Doxycycline  Hives  Hydrocodone Nausea Only   Metronidazole      Was either cipro  or flagyl - had hives with one of them.     Current Outpatient Medications on File Prior to Visit  Medication Sig Dispense Refill   Ascorbic Acid (VITAMIN C) 1000 MG tablet Take 1,000 mg by mouth daily.     Calcium-Vitamin D -Vitamin K (CALCIUM SOFT CHEWS PO) Take 1 tablet by mouth 2 (two) times daily.     Cholecalciferol (VITAMIN D3) 250 MCG (10000 UT) capsule Take 10,000 Units by mouth daily.     EPINEPHrine  (EPIPEN  2-PAK) 0.3 mg/0.3 mL IJ SOAJ injection Inject 0.3 mg into the muscle as needed for anaphylaxis. 1 each 2   glucosamine-chondroitin 500-400 MG tablet Take 1 tablet by mouth daily.     Krill Oil 1000 MG CAPS Take 1 capsule by mouth 2 (two) times daily.     Lactobacillus (PROBIOTIC ACIDOPHILUS PO) Take by mouth.     metoprolol  succinate (TOPROL -XL) 50 MG 24 hr tablet Take 1 tablet (50 mg total) by mouth daily. Take with or immediately following a meal. 90 tablet 1   omeprazole (PRILOSEC) 40 MG capsule Take 40 mg by mouth daily.     Vitamins-Lipotropics (B-50) TABS Take 1 tablet by mouth daily.     zolpidem  (AMBIEN ) 5 MG tablet Take 1 tablet (5 mg total) by mouth at bedtime as needed for sleep. 30 tablet 1   No current facility-administered medications on file prior to visit.    BP 128/82   Pulse 60   Temp 97.9 F (36.6 C) (Oral)   Resp 15   Ht 5' 5 (1.651 m)   Wt 130 lb 12.8 oz (59.3 kg)   SpO2 99%   BMI 21.77 kg/m         Objective:   Physical Exam   General Mental Status- Alert. General Appearance- Not in acute distress.   Skin General: Color- Normal Color. Moisture- Normal Moisture.  Neck Carotid Arteries- Normal color. Moisture- Normal Moisture. No carotid bruits. No JVD.  Chest and Lung Exam Auscultation: Breath Sounds:-Normal.  Cardiovascular Auscultation:Rythm- Regular. Murmurs & Other Heart Sounds:Auscultation of the heart reveals- No Murmurs.  Abdomen Inspection:-Inspeection Normal. Palpation/Percussion:Note:No mass. Palpation and Percussion of the abdomen reveal- Non Tender, Non Distended + BS, no rebound or guarding.   Neurologic Cranial Nerve exam:- CN III-XII intact(No nystagmus), symmetric smile. Drift Test:- No drift. Romberg Exam:- Negative. But pt felt off balance mild Heal to Toe Gait exam:-Normal. But also felt off balance Finger to Nose:- Normal/Intact Strength:- 5/5 equal and symmetric strength both upper and lower extremities.  -supine to sitting felt brief mild queezy. On lying supine and turning head no vertigo reproduced.         Assessment & Plan:   Patient Instructions  Headache syndrome(post vertigo and hx of migraine) - CT HEAD WO CONTRAST ( ); Future(stat) -please go down stairs and get scheduled for today. -[phone].  Anemia, unspecified type - CBC w/Diff; Future - Iron, TIBC and Ferritin Panel; Future  Elevated blood sugar - Hemoglobin A1c; Future  Vertigo - CT HEAD WO CONTRAST ( ); Future  If vertigo occurs again would send to PT for Epley -if vertigo ever with motor or sensory deficits then be seen in ED. -rx meclizine  to use if needed  Need for influenza vaccination (Primary) - Flu vaccine HIGH DOSE PF(Fluzone Trivalent)  Follow up date to be determined after ct head as well as repeat lab results. Sooner if needed    [employer], PA-C  I personally spent a total of 45 minutes in the care of the patient today  including performing a medically appropriate exam/evaluation, counseling and educating, placing orders, and documenting clinical information in the EHR.

## 2023-10-31 NOTE — Patient Instructions (Addendum)
 Headache syndrome(post vertigo and hx of migraine) - CT HEAD WO CONTRAST ( ); Future(stat) -please go down stairs and get scheduled for today. -[phone].  Anemia, unspecified type - CBC w/Diff; Future - Iron, TIBC and Ferritin Panel; Future  Elevated blood sugar - Hemoglobin A1c; Future  Vertigo - CT HEAD WO CONTRAST ( ); Future  If vertigo occurs again would send to PT for Epley -if vertigo ever with motor or sensory deficits then be seen in ED. -rx meclizine  to use if needed  Need for influenza vaccination (Primary) - Flu vaccine HIGH DOSE PF(Fluzone Trivalent)  Follow up date to be determined after ct head as well as repeat lab results. Sooner if needed

## 2023-10-31 NOTE — Telephone Encounter (Signed)
 Auth Submission: NO AUTH NEEDED Site of care: Site of care: [facility] Payer: MEDICARE A/B & ALLSTATE SUPP Medication & CPT/J Code(s) submitted: Prolia (Denosumab) [id number] Diagnosis Code: M81.0 Route of submission (phone, fax, portal):  Phone # Fax # Auth type: Buy/Bill PB Units/visits requested: X1 DOSE Reference number:  Approval from: 10/31/23 to 02/06/24

## 2023-10-31 NOTE — Telephone Encounter (Signed)
 [facility]  Prolia VOB initiated via [email or web address]  Next Prolia inj DUE: new start

## 2023-11-01 DIAGNOSIS — R748 Abnormal levels of other serum enzymes: Secondary | ICD-10-CM | POA: Diagnosis not present

## 2023-11-01 DIAGNOSIS — K219 Gastro-esophageal reflux disease without esophagitis: Secondary | ICD-10-CM | POA: Diagnosis not present

## 2023-11-01 DIAGNOSIS — R1312 Dysphagia, oropharyngeal phase: Secondary | ICD-10-CM | POA: Diagnosis not present

## 2023-11-01 DIAGNOSIS — D649 Anemia, unspecified: Secondary | ICD-10-CM | POA: Diagnosis not present

## 2023-11-02 ENCOUNTER — Ambulatory Visit: Payer: Self-pay | Admitting: Family Medicine

## 2023-11-02 ENCOUNTER — Encounter: Payer: Self-pay | Admitting: Family Medicine

## 2023-11-02 DIAGNOSIS — D509 Iron deficiency anemia, unspecified: Secondary | ICD-10-CM

## 2023-11-02 NOTE — Telephone Encounter (Signed)
 VOB pending

## 2023-11-02 NOTE — Progress Notes (Signed)
 Kidney function is okay as is calcium.  Vitamin D  level is okay.  B12 level is okay.  However there are some labs that are a bit abnormal.  The alkaline phosphatase is still elevated which it has been consistently for the last 2 years.  Hemoglobin looks low which could indicate anemia.  I think it is okay to proceed with Prolia but I think we should also check your iron stores.  I am going to order another lab and at your convenience I like you to swing by the lab to get this blood drawn.  I am also going to talk to your primary care provider about these lab results.

## 2023-11-04 ENCOUNTER — Other Ambulatory Visit: Payer: Self-pay | Admitting: Family Medicine

## 2023-11-04 DIAGNOSIS — R748 Abnormal levels of other serum enzymes: Secondary | ICD-10-CM

## 2023-11-05 ENCOUNTER — Encounter: Payer: Self-pay | Admitting: Family Medicine

## 2023-11-05 ENCOUNTER — Ambulatory Visit: Payer: PRIVATE HEALTH INSURANCE | Admitting: Family Medicine

## 2023-11-05 VITALS — BP 118/80 | HR 51 | Temp 98.3°F | Resp 16 | Ht 65.0 in | Wt 129.2 lb

## 2023-11-05 DIAGNOSIS — K130 Diseases of lips: Secondary | ICD-10-CM | POA: Diagnosis not present

## 2023-11-05 DIAGNOSIS — R748 Abnormal levels of other serum enzymes: Secondary | ICD-10-CM

## 2023-11-05 DIAGNOSIS — R739 Hyperglycemia, unspecified: Secondary | ICD-10-CM | POA: Diagnosis not present

## 2023-11-05 DIAGNOSIS — D649 Anemia, unspecified: Secondary | ICD-10-CM | POA: Insufficient documentation

## 2023-11-05 NOTE — Progress Notes (Signed)
 Subjective:    Patient ID: Carrie [name], female    DOB: Jun 21, 1945, 78 y.o.   MRN: [id number]  Chief Complaint  Patient presents with   Lip concern    X2 weeks, bottom lip, white patches, no pain.    HPI Patient is in today for spot on lip.  Discussed the use of AI scribe software for clinical note transcription with the patient, who gave verbal consent to proceed.  History of Present Illness Carrie [name] is a 78 year old female who presents with a white spot and rough area on her lip.  She has a white spot on the inside of her lip and a rough area on her lip for the past two to three weeks. The white spot is small and not painful. The rough area is also not sore, and she suspects it might be due to habitual biting, especially when her lip becomes swollen.  She has a history of breast cancer, which makes her particularly vigilant about new or unusual changes in her body. She has a routine dental check-up every six months, with the last visit being approximately four months ago, during which her dentist performed a screening examination.  No pain or soreness in the affected areas. No recent dental issues noted.    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   NEOPLASM, MALIGNANT, BREAST, HX OF 03/03/2010   OSTEOPENIA 03/03/2010   PALPITATIONS, HX OF 03/03/2010   Pulmonary nodules 01/07/2015   Rib fracture    left, 3 fractures s/p radiation: right side 2 fractured, no surgery    Past Surgical History:  Procedure  Laterality Date   BREAST LUMPECTOMY WITH NEEDLE LOCALIZATION AND AXILLARY SENTINEL LYMPH NODE BX Left 08/2004   clavicle dislocate  78 yrs old   right, reapproximated w/pins, subsequently removed   HERNIA REPAIR  05/2012   left hip traumatic history     hematoma surgically evacuated   SHOULDER ARTHROSCOPY     b/l shoulder with debridement   TONSILLECTOMY      Family History  Problem Relation Age of Onset   Hypertension Mother    COPD Mother    Alcohol abuse Father        history of   Coronary artery disease Father    Osteoarthritis Brother        knees   Basal cell carcinoma Sister    Heart disease Maternal Grandmother    Stroke Maternal Grandfather    Osteoarthritis Sister        knees   Migraines Sister     Social History   Socioeconomic History   Marital status: Married    Spouse name: Lynwood   Number of children: 1   Years of education: Not on file   Highest education level: Associate degree: occupational, Scientist, product/process development, or vocational program  Occupational History   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  Tobacco Use   Smoking status: Former    Current packs/day: 0.00    Average packs/day: (0.3  ttl pk-yrs)    Types: Cigarettes    Start date: 62    Quit date: 10    Years since quitting: 45.7   Smokeless tobacco: Never   Tobacco comments:    Social smoker 1970-1980  Vaping Use   Vaping status: Never Used  Substance and Sexual Activity   Alcohol use: Yes    Alcohol/week: 2.0 standard drinks of alcohol    Types: 2 Glasses of wine per week    Comment: 1 glass twice a week   Drug use: No    Comment: CBD gummies at night for sleep   Sexual activity: Not Currently    Birth control/protection: Post-menopausal  Other Topics Concern   Not on file  Social History Narrative   Married to [place], 1 daughter lives in [place], [place]   Social Drivers of Health   Financial Resource Strain: Low Risk  (10/31/2023)   Overall Financial Resource Strain  (CARDIA)    Difficulty of Paying Living Expenses: Not hard at all  Food Insecurity: Low Risk  (11/01/2023)   Received from [facility]   Hunger Vital Sign    Within the past 12 months, you worried that your food would run out before you got money to buy more: Never true    Within the past 12 months, the food you bought just didn't last and you didn't have money to get more. : Never true  Transportation Needs: No Transportation Needs (11/01/2023)   Received from [facility]   Transportation    In the past 12 months, has lack of reliable transportation kept you from medical appointments, meetings, work or from getting things needed for daily living? : No  Physical Activity: Sufficiently Active (10/31/2023)   Exercise Vital Sign    Days of Exercise per Week: 4 days    Minutes of Exercise per Session: 80 min  Stress: Stress Concern Present (10/31/2023)   [employer] of Occupational Health - Occupational Stress Questionnaire    Feeling of Stress: To some extent  Social Connections: Socially Integrated (10/31/2023)   Social Connection and Isolation Panel    Frequency of Communication with Friends and Family: More than three times a week    Frequency of Social Gatherings with Friends and Family: More than three times a week    Attends Religious Services: More than 4 times per year    Active Member of [employer] or Organizations: Yes    Attends Engineer, structural: More than 4 times per year    Marital Status: Married  Catering manager Violence: Not At Risk (08/31/2023)   Humiliation, Afraid, Rape, and Kick questionnaire    Fear of Current or Ex-Partner: No    Emotionally Abused: No    Physically Abused: No    Sexually Abused: No    Outpatient Medications Prior to Visit  Medication Sig Dispense Refill   Ascorbic Acid (VITAMIN C) 1000 MG tablet Take 1,000 mg by mouth daily.     Calcium-Vitamin D -Vitamin K (CALCIUM SOFT CHEWS PO) Take 1 tablet by mouth 2 (two) times daily.      Cholecalciferol (VITAMIN D3) 250 MCG (10000 UT) capsule Take 10,000 Units by mouth daily.     EPINEPHrine  (EPIPEN  2-PAK) 0.3 mg/0.3 mL IJ SOAJ injection Inject 0.3 mg into the muscle as needed for anaphylaxis. 1 each 2   glucosamine-chondroitin 500-400 MG tablet Take 1 tablet by mouth daily.     Krill Oil 1000 MG CAPS Take 1 capsule by mouth 2 (two) times daily.  Lactobacillus (PROBIOTIC ACIDOPHILUS PO) Take by mouth.     meclizine  (ANTIVERT ) 12.5 MG tablet Take 1 tablet (12.5 mg total) by mouth 3 (three) times daily as needed for dizziness. 15 tablet 0   metoprolol  succinate (TOPROL -XL) 50 MG 24 hr tablet Take 1 tablet (50 mg total) by mouth daily. Take with or immediately following a meal. 90 tablet 1   omeprazole (PRILOSEC) 40 MG capsule Take 40 mg by mouth daily.     Vitamins-Lipotropics (B-50) TABS Take 1 tablet by mouth daily.     zolpidem  (AMBIEN ) 5 MG tablet Take 1 tablet (5 mg total) by mouth at bedtime as needed for sleep. 30 tablet 1   No facility-administered medications prior to visit.    Allergies  Allergen Reactions   Hydrocodone-Acetaminophen Other (See Comments)    Slows Respiration Rate Down   Ciprofloxacin      ? Allergy was on cpro/flagyl .   Codeine     REACTION: upsets stomach   Demerol     hives   Doxycycline  Hives   Hydrocodone Nausea Only   Metronidazole      Was either cipro  or flagyl - had hives with one of them.     Review of Systems  Constitutional:  Negative for fever and malaise/fatigue.  HENT:  Negative for congestion.        White spot on inside bottom lip  Eyes:  Negative for blurred vision.  Respiratory:  Negative for cough and shortness of breath.   Cardiovascular:  Negative for chest pain, palpitations and leg swelling.  Gastrointestinal:  Negative for abdominal pain, blood in stool, nausea and vomiting.  Genitourinary:  Negative for dysuria and frequency.  Musculoskeletal:  Negative for back pain and falls.  Skin:  Negative for rash.   Neurological:  Negative for dizziness, loss of consciousness and headaches.  Endo/Heme/Allergies:  Negative for environmental allergies.  Psychiatric/Behavioral:  Negative for depression. The patient is not nervous/anxious.        Objective:    Physical Exam  BP 118/80 (BP Location: Left Arm, Patient Position: Sitting, Cuff Size: Normal)   Pulse (!) 51   Temp 98.3 F (36.8 C) (Oral)   Resp 16   Ht 5' 5 (1.651 m)   Wt 129 lb 3.2 oz (58.6 kg)   SpO2 96%   BMI 21.50 kg/m  Wt Readings from Last 3 Encounters:  11/05/23 129 lb 3.2 oz (58.6 kg)  10/31/23 130 lb 12.8 oz (59.3 kg)  10/30/23 130 lb (59 kg)    Diabetic Foot Exam - Simple   No data filed    Lab Results  Component Value Date   WBC 5.2 10/30/2023   HGB 11.2 (L) 10/30/2023   HCT 34.3 (L) 10/30/2023   PLT 204.0 10/30/2023   GLUCOSE 101 (H) 10/30/2023   CHOL 219 (H) 02/27/2022   TRIG 116.0 02/27/2022   HDL 88.10 02/27/2022   LDLDIRECT 94.8 07/28/2010   LDLCALC 107 (H) 02/27/2022   ALT 15 10/30/2023   AST 21 10/30/2023   NA 137 10/30/2023   K 4.3 10/30/2023   CL 102 10/30/2023   CREATININE 0.74 10/30/2023   BUN 13 10/30/2023   CO2 28 10/30/2023   TSH 1.37 02/27/2022   HGBA1C 5.5 09/25/2017    Lab Results  Component Value Date   TSH 1.37 02/27/2022   Lab Results  Component Value Date   WBC 5.2 10/30/2023   HGB 11.2 (L) 10/30/2023   HCT 34.3 (L) 10/30/2023   MCV 84.8 10/30/2023  PLT 204.0 10/30/2023   Lab Results  Component Value Date   NA 137 10/30/2023   K 4.3 10/30/2023   CO2 28 10/30/2023   GLUCOSE 101 (H) 10/30/2023   BUN 13 10/30/2023   CREATININE 0.74 10/30/2023   BILITOT 0.4 10/30/2023   ALKPHOS 217 (H) 10/30/2023   AST 21 10/30/2023   ALT 15 10/30/2023   PROT 6.6 10/30/2023   ALBUMIN 4.1 10/30/2023   CALCIUM 9.1 10/30/2023   ANIONGAP 13 03/03/2020   GFR 78.74 10/30/2023   Lab Results  Component Value Date   CHOL 219 (H) 02/27/2022   Lab Results  Component Value Date    HDL 88.10 02/27/2022   Lab Results  Component Value Date   LDLCALC 107 (H) 02/27/2022   Lab Results  Component Value Date   TRIG 116.0 02/27/2022   Lab Results  Component Value Date   CHOLHDL 2 02/27/2022   Lab Results  Component Value Date   HGBA1C 5.5 09/25/2017       Assessment & Plan:  Lesion of lip Assessment & Plan: Pt just noticed a few weeks ago---she has an app with dentist in nov Monitor for stability--- if any changes return to office    Elevated alkaline phosphatase level -     Alkaline phosphatase, isoenzymes -     COMPLETE METABOLIC PANEL WITHOUT GFR -     Alkaline phosphatase, isoenzymes  Anemia, unspecified type -     Iron, TIBC and Ferritin Panel -     CBC with Differential/Platelet  Elevated blood sugar -     Hemoglobin A1c  Assessment and Plan Assessment & Plan Oral mucosal white spot and rough area   A small, clean, round, non-painful white spot and rough area on the inside of her lip has been present for a couple of weeks. The differential diagnosis includes benign lesions versus potential malignancy, though malignancy is less likely given the size and appearance. The rough area is likely due to habitual biting. Monitor for changes in size or appearance. She should contact the dentist if changes or growth occur. Reassure her that the current appearance does not suggest malignancy. No immediate need for dental consultation before the scheduled appointment unless changes occur.  Anemia, under evaluation   Anemia is under evaluation with blood work ordered by Dr. Carleton and Dr. Joane. Complete blood work as ordered.  Elevated liver function tests, under evaluation   Elevated liver function tests are under evaluation with additional blood work ordered. Complete blood work as ordered.    Rayford Williamsen R Lowne Chase, DO

## 2023-11-05 NOTE — Assessment & Plan Note (Signed)
 Pt just noticed a few weeks ago---she has an app with dentist in nov Monitor for stability--- if any changes return to office

## 2023-11-05 NOTE — Telephone Encounter (Signed)
 Dr. Joane, pt scheduled for Prolia inj 11/08/23, let us  know if she needs to hold off d/t lab results.   Thanks!

## 2023-11-06 ENCOUNTER — Other Ambulatory Visit: Payer: PRIVATE HEALTH INSURANCE

## 2023-11-06 LAB — COMPLETE METABOLIC PANEL WITHOUT GFR
AG Ratio: 1.8 (calc) (ref 1.0–2.5)
ALT: 12 U/L (ref 6–29)
AST: 16 U/L (ref 10–35)
Albumin: 4.2 g/dL (ref 3.6–5.1)
Alkaline phosphatase (APISO): 207 U/L — ABNORMAL HIGH (ref 37–153)
BUN: 14 mg/dL (ref 7–25)
CO2: 29 mmol/L (ref 20–32)
Calcium: 9.1 mg/dL (ref 8.6–10.4)
Chloride: 104 mmol/L (ref 98–110)
Creat: 0.6 mg/dL (ref 0.60–1.00)
Globulin: 2.3 g/dL (ref 1.9–3.7)
Glucose, Bld: 105 mg/dL — ABNORMAL HIGH (ref 65–99)
Potassium: 4.1 mmol/L (ref 3.5–5.3)
Sodium: 140 mmol/L (ref 135–146)
Total Bilirubin: 0.5 mg/dL (ref 0.2–1.2)
Total Protein: 6.5 g/dL (ref 6.1–8.1)

## 2023-11-06 LAB — CBC WITH DIFFERENTIAL/PLATELET
Basophils Absolute: 0 K/uL (ref 0.0–0.1)
Basophils Relative: 0.7 % (ref 0.0–3.0)
Eosinophils Absolute: 0.1 K/uL (ref 0.0–0.7)
Eosinophils Relative: 1.8 % (ref 0.0–5.0)
HCT: 34.4 % — ABNORMAL LOW (ref 36.0–46.0)
Hemoglobin: 11.2 g/dL — ABNORMAL LOW (ref 12.0–15.0)
Lymphocytes Relative: 19.5 % (ref 12.0–46.0)
Lymphs Abs: 0.9 K/uL (ref 0.7–4.0)
MCHC: 32.4 g/dL (ref 30.0–36.0)
MCV: 86.2 fl (ref 78.0–100.0)
Monocytes Absolute: 0.3 K/uL (ref 0.1–1.0)
Monocytes Relative: 6.5 % (ref 3.0–12.0)
Neutro Abs: 3.4 K/uL (ref 1.4–7.7)
Neutrophils Relative %: 71.5 % (ref 43.0–77.0)
Platelets: 181 K/uL (ref 150.0–400.0)
RBC: 3.99 Mil/uL (ref 3.87–5.11)
RDW: 19.4 % — ABNORMAL HIGH (ref 11.5–15.5)
WBC: 4.7 K/uL (ref 4.0–10.5)

## 2023-11-06 LAB — IRON,TIBC AND FERRITIN PANEL
%SAT: 11 % — ABNORMAL LOW (ref 16–45)
Ferritin: 9 ng/mL — ABNORMAL LOW (ref 16–288)
Iron: 46 ug/dL (ref 45–160)
TIBC: 424 ug/dL (ref 250–450)

## 2023-11-06 LAB — HEMOGLOBIN A1C: Hgb A1c MFr Bld: 6 % (ref 4.6–6.5)

## 2023-11-06 MED ORDER — IRON (FERROUS SULFATE) 325 (65 FE) MG PO TABS
325.0000 mg | ORAL_TABLET | Freq: Every day | ORAL | 0 refills | Status: DC
Start: 2023-11-06 — End: 2023-12-03

## 2023-11-06 NOTE — Addendum Note (Signed)
 Addended by: DORINA DALLAS HERO on: 11/06/2023 09:55 PM   Modules accepted: Orders

## 2023-11-07 NOTE — Telephone Encounter (Signed)
 Carrie [name], CPhT    10/31/23  9:53 AM Note Auth Submission: NO AUTH NEEDED Site of care: Site of care: [facility] Payer: MEDICARE A/B & ALLSTATE SUPP Medication & CPT/J Code(s) submitted: Prolia (Denosumab) [id number] Diagnosis Code: M81.0 Route of submission (phone, fax, portal):  Phone # Fax # Auth type: Buy/Bill PB Units/visits requested: X1 DOSE Reference number:  Approval from: 10/31/23 to 02/06/24

## 2023-11-08 ENCOUNTER — Ambulatory Visit (INDEPENDENT_AMBULATORY_CARE_PROVIDER_SITE_OTHER): Payer: PRIVATE HEALTH INSURANCE

## 2023-11-08 ENCOUNTER — Telehealth: Payer: Self-pay

## 2023-11-08 VITALS — BP 116/72 | HR 60 | Temp 97.9°F | Resp 12 | Ht 65.0 in | Wt 129.6 lb

## 2023-11-08 DIAGNOSIS — M810 Age-related osteoporosis without current pathological fracture: Secondary | ICD-10-CM | POA: Diagnosis not present

## 2023-11-08 MED ORDER — DENOSUMAB 60 MG/ML ~~LOC~~ SOSY
60.0000 mg | PREFILLED_SYRINGE | SUBCUTANEOUS | Status: DC
  Administered 2023-11-08: 60 mg via SUBCUTANEOUS
  Filled 2023-11-08: qty 1

## 2023-11-08 NOTE — Telephone Encounter (Signed)
 Copied from CRM [id number]. Topic: Clinical - Medication Question >> Nov 08, 2023  2:47 PM Carrie [name] wrote: Reason for CRM: Patient is calling in stating she is suppose to take the iron medication for a month and then do blood work  but the patient will be on vacation at that time. Patient is asking if should delay starting it so when a month is up she will be back home, or if it would okay for the blood work to be a little late. Please advise the patient.

## 2023-11-08 NOTE — Progress Notes (Signed)
 Diagnosis: Osteoporosis  Provider:  Mannam, Praveen MD  Procedure: Injection  Prolia (Denosumab), Dose: 60 mg, Site: subcutaneous, Number of injections: 1  Injection Site(s): Right arm  Post Care: Observation period completed  Discharge: Condition: Stable, Destination: Home . AVS Declined  Performed by:  Rocky FORBES Sar, RN

## 2023-11-08 NOTE — Telephone Encounter (Signed)
**Note De-identified  [place] Obfuscation** Please advise 

## 2023-11-09 LAB — ALKALINE PHOSPHATASE, ISOENZYMES
Alkaline Phosphatase: 235 IU/L — ABNORMAL HIGH (ref 49–135)
BONE FRACTION: 68 % (ref 14–68)
INTESTINAL FRAC.: 10 % (ref 0–18)
LIVER FRACTION: 22 % (ref 18–85)

## 2023-11-09 NOTE — Telephone Encounter (Signed)
 Pt.notified

## 2023-11-09 NOTE — Telephone Encounter (Signed)
 Reclast infusion 11/08/23 Next due 11/07/24

## 2023-11-11 ENCOUNTER — Ambulatory Visit: Payer: Self-pay | Admitting: Family Medicine

## 2023-12-01 ENCOUNTER — Other Ambulatory Visit: Payer: Self-pay | Admitting: Medical

## 2023-12-05 DIAGNOSIS — D485 Neoplasm of uncertain behavior of skin: Secondary | ICD-10-CM | POA: Diagnosis not present

## 2023-12-05 DIAGNOSIS — C44722 Squamous cell carcinoma of skin of right lower limb, including hip: Secondary | ICD-10-CM | POA: Diagnosis not present

## 2023-12-05 DIAGNOSIS — Z85828 Personal history of other malignant neoplasm of skin: Secondary | ICD-10-CM | POA: Diagnosis not present

## 2023-12-14 ENCOUNTER — Encounter: Payer: Self-pay | Admitting: Family Medicine

## 2023-12-17 ENCOUNTER — Other Ambulatory Visit (INDEPENDENT_AMBULATORY_CARE_PROVIDER_SITE_OTHER): Payer: PRIVATE HEALTH INSURANCE

## 2023-12-17 DIAGNOSIS — D649 Anemia, unspecified: Secondary | ICD-10-CM

## 2023-12-18 ENCOUNTER — Other Ambulatory Visit (INDEPENDENT_AMBULATORY_CARE_PROVIDER_SITE_OTHER)

## 2023-12-18 DIAGNOSIS — D649 Anemia, unspecified: Secondary | ICD-10-CM

## 2023-12-18 LAB — CBC WITH DIFFERENTIAL/PLATELET
Basophils Absolute: 0 K/uL (ref 0.0–0.1)
Basophils Relative: 0.8 % (ref 0.0–3.0)
Eosinophils Absolute: 0.1 K/uL (ref 0.0–0.7)
Eosinophils Relative: 1.4 % (ref 0.0–5.0)
HCT: 39.1 % (ref 36.0–46.0)
Hemoglobin: 13 g/dL (ref 12.0–15.0)
Lymphocytes Relative: 26.1 % (ref 12.0–46.0)
Lymphs Abs: 1.3 K/uL (ref 0.7–4.0)
MCHC: 33.3 g/dL (ref 30.0–36.0)
MCV: 91.7 fl (ref 78.0–100.0)
Monocytes Absolute: 0.3 K/uL (ref 0.1–1.0)
Monocytes Relative: 5.5 % (ref 3.0–12.0)
Neutro Abs: 3.3 K/uL (ref 1.4–7.7)
Neutrophils Relative %: 66.2 % (ref 43.0–77.0)
Platelets: 181 K/uL (ref 150.0–400.0)
RBC: 4.26 Mil/uL (ref 3.87–5.11)
RDW: 23.4 % — ABNORMAL HIGH (ref 11.5–15.5)
WBC: 5 K/uL (ref 4.0–10.5)

## 2023-12-18 LAB — IRON,TIBC AND FERRITIN PANEL
%SAT: 76 % — ABNORMAL HIGH (ref 16–45)
Ferritin: 19 ng/mL (ref 16–288)
Iron: 300 ug/dL — ABNORMAL HIGH (ref 45–160)
TIBC: 393 ug/dL (ref 250–450)

## 2023-12-19 ENCOUNTER — Ambulatory Visit: Payer: Self-pay | Admitting: Family Medicine

## 2023-12-19 LAB — FECAL OCCULT BLOOD, IMMUNOCHEMICAL: Fecal Occult Bld: NEGATIVE

## 2023-12-25 DIAGNOSIS — H168 Other keratitis: Secondary | ICD-10-CM | POA: Diagnosis not present

## 2024-01-08 DIAGNOSIS — L570 Actinic keratosis: Secondary | ICD-10-CM | POA: Diagnosis not present

## 2024-01-08 DIAGNOSIS — L821 Other seborrheic keratosis: Secondary | ICD-10-CM | POA: Diagnosis not present

## 2024-01-08 DIAGNOSIS — Z85820 Personal history of malignant melanoma of skin: Secondary | ICD-10-CM | POA: Diagnosis not present

## 2024-01-08 DIAGNOSIS — D485 Neoplasm of uncertain behavior of skin: Secondary | ICD-10-CM | POA: Diagnosis not present

## 2024-01-08 DIAGNOSIS — Z85828 Personal history of other malignant neoplasm of skin: Secondary | ICD-10-CM | POA: Diagnosis not present

## 2024-01-08 DIAGNOSIS — L905 Scar conditions and fibrosis of skin: Secondary | ICD-10-CM | POA: Diagnosis not present

## 2024-01-16 NOTE — Progress Notes (Signed)
 Subjective:    Patient ID: Carrie [name], female    DOB: [birth year], 78 y.o.   MRN: [id number]  No chief complaint on file.   HPI Discussed the use of AI scribe software for clinical note transcription with the patient, who gave verbal consent to proceed.  History of Present Illness Carrie [name] is a 78 year old female who presents for follow-up regarding elevated alkaline phosphatase levels and iron  supplementation.  She has had persistently elevated alkaline phosphatase levels since 2017 or 2018, following her mother's passing. Despite this, she has not experienced any specific symptoms such as abdominal pain or bone pain. Previous workups, including fractionation of alkaline phosphatase and a CT scan of the pancreas, have not revealed any concerning findings.  She is currently taking iron  supplements due to a history of anemia, which has improved her fatigue. However, the iron  supplementation causes diarrhea and dark stools.  She has a history of osteoporosis in her neck and has recently received a Prolia  injection. She takes calcium supplements, specifically chewables, at a dose of 600 mg twice daily, and is mindful of her dietary calcium intake, which includes cream in her coffee, yogurt, and cheese.  She exercises five days a week and is diligent about hydration, drinking water regularly throughout the day. She has a history of donating blood but experienced prolonged recovery after her last donation, which she attributes to her iron  levels.  She experiences sleep disturbances, characterized by difficulty staying asleep, and has been using CBD gummies to aid her sleep. She has tried melatonin in the past without success. The gummies help her return to sleep after waking up during the night.  Her family history includes a sister with similar blood work issues and a mother who was not diligent about hydration. She has three daughters, one of whom lives in [place], and a  granddaughter who recently started kindergarten.    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   NEOPLASM, MALIGNANT, BREAST, HX OF 03/03/2010   OSTEOPENIA 03/03/2010   PALPITATIONS, HX OF 03/03/2010   Pulmonary nodules 01/07/2015   Rib fracture    left, 3 fractures s/p radiation: right side 2 fractured, no surgery    Past Surgical History:  Procedure Laterality Date   BREAST LUMPECTOMY WITH NEEDLE LOCALIZATION AND AXILLARY SENTINEL LYMPH NODE BX Left 08/2004   clavicle dislocate  78 yrs old   right, reapproximated w/pins, subsequently removed   HERNIA REPAIR  05/2012   left hip traumatic history     hematoma surgically evacuated   SHOULDER ARTHROSCOPY     b/l shoulder with debridement   TONSILLECTOMY      Family History  Problem Relation Age of Onset   Hypertension Mother    COPD Mother    Alcohol abuse Father        history of   Coronary artery disease Father    Osteoarthritis Brother        knees   Basal cell carcinoma Sister    Heart disease  Maternal Grandmother    Stroke Maternal Grandfather    Osteoarthritis Sister        knees   Migraines Sister     Social History   Socioeconomic History   Marital status: Married    Spouse name: Lynwood   Number of children: 1   Years of education: Not on file   Highest education level: Associate degree: occupational, scientist, product/process development, or vocational program  Occupational History   Occupation: retired biomedical scientist for [employer]  Tobacco Use   Smoking status: Former     Current packs/day: 0.00    Average packs/day: (0.3 ttl pk-yrs)    Types: Cigarettes    Start date: 1970    Quit date: 1980    Years since quitting: 45.9   Smokeless tobacco: Never   Tobacco comments:    Social smoker 1970-1980  Vaping Use   Vaping status: Never Used  Substance and Sexual Activity   Alcohol use: Yes    Alcohol/week: 2.0 standard drinks of alcohol    Types: 2 Glasses of wine per week    Comment: 1 glass twice a week   Drug use: No    Comment: CBD gummies at night for sleep   Sexual activity: Not Currently    Birth control/protection: Post-menopausal  Other Topics Concern   Not on file  Social History Narrative   Married to [place], 1 daughter lives in [place], [place]   Social Drivers of Health   Financial Resource Strain: Low Risk  (10/31/2023)   Overall Financial Resource Strain (CARDIA)    Difficulty of Paying Living Expenses: Not hard at all  Food Insecurity: Low Risk (11/01/2023)   Received from [facility]   Hunger Vital Sign    Within the past 12 months, you worried that your food would run out before you got money to buy more: Never true    Within the past 12 months, the food you bought just didn't last and you didn't have money to get more. : Never true  Transportation Needs: No Transportation Needs (11/01/2023)   Received from [facility]   Transportation    In the past 12 months, has lack of reliable transportation kept you from medical appointments, meetings, work or from getting things needed for daily living? : No  Physical Activity: Sufficiently Active (10/31/2023)   Exercise Vital Sign    Days of Exercise per Week: 4 days    Minutes of Exercise per Session: 80 min  Stress: Stress Concern Present (10/31/2023)   [employer] of Occupational Health - Occupational Stress Questionnaire    Feeling of Stress: To some extent  Social Connections: Socially Integrated (10/31/2023)   Social Connection and Isolation Panel    Frequency of Communication  with Friends and Family: More than three times a week    Frequency of Social Gatherings with Friends and Family: More than three times a week    Attends Religious Services: More than 4 times per year    Active Member of [employer] or Organizations: Yes    Attends Engineer, Structural: More than 4 times per year    Marital Status: Married  Catering Manager Violence: Not At Risk (08/31/2023)   Humiliation, Afraid, Rape, and Kick questionnaire    Fear of Current or Ex-Partner: No    Emotionally Abused: No    Physically Abused: No    Sexually Abused: No    Outpatient Medications Prior to Visit  Medication Sig Dispense Refill   Ascorbic Acid (VITAMIN C)  1000 MG tablet Take 1,000 mg by mouth daily.     Calcium-Vitamin D -Vitamin K (CALCIUM SOFT CHEWS PO) Take 1 tablet by mouth 2 (two) times daily.     Cholecalciferol (VITAMIN D3) 250 MCG (10000 UT) capsule Take 10,000 Units by mouth daily.     EPINEPHrine  (EPIPEN  2-PAK) 0.3 mg/0.3 mL IJ SOAJ injection Inject 0.3 mg into the muscle as needed for anaphylaxis. 1 each 2   ferrous sulfate  325 (65 FE) MG tablet TAKE 325 MG BY MOUTH DAILY. 90 tablet 1   glucosamine-chondroitin 500-400 MG tablet Take 1 tablet by mouth daily.     Krill Oil 1000 MG CAPS Take 1 capsule by mouth 2 (two) times daily.     Lactobacillus (PROBIOTIC ACIDOPHILUS PO) Take by mouth.     meclizine  (ANTIVERT ) 12.5 MG tablet Take 1 tablet (12.5 mg total) by mouth 3 (three) times daily as needed for dizziness. 15 tablet 0   metoprolol  succinate (TOPROL -XL) 50 MG 24 hr tablet Take 1 tablet (50 mg total) by mouth daily. Take with or immediately following a meal. 90 tablet 1   omeprazole (PRILOSEC) 40 MG capsule Take 40 mg by mouth daily.     Vitamins-Lipotropics (B-50) TABS Take 1 tablet by mouth daily.     zolpidem  (AMBIEN ) 5 MG tablet Take 1 tablet (5 mg total) by mouth at bedtime as needed for sleep. 30 tablet 1   No facility-administered medications prior to visit.     Allergies  Allergen Reactions   Hydrocodone-Acetaminophen Other (See Comments)    Slows Respiration Rate Down   Ciprofloxacin      ? Allergy was on cpro/flagyl .   Codeine     REACTION: upsets stomach   Demerol     hives   Doxycycline  Hives   Hydrocodone Nausea Only   Metronidazole      Was either cipro  or flagyl - had hives with one of them.     Review of Systems  Constitutional:  Negative for fever and malaise/fatigue.  HENT:  Negative for congestion.   Eyes:  Negative for blurred vision.  Respiratory:  Negative for shortness of breath.   Cardiovascular:  Negative for chest pain, palpitations and leg swelling.  Gastrointestinal:  Negative for abdominal pain, blood in stool and nausea.  Genitourinary:  Negative for dysuria and frequency.  Musculoskeletal:  Negative for falls.  Skin:  Negative for rash.  Neurological:  Negative for dizziness, loss of consciousness and headaches.  Endo/Heme/Allergies:  Negative for environmental allergies.  Psychiatric/Behavioral:  Negative for depression. The patient has insomnia. The patient is not nervous/anxious.        Objective:    Physical Exam Constitutional:      General: She is not in acute distress.    Appearance: Normal appearance. She is well-developed. She is not toxic-appearing.  HENT:     Head: Normocephalic and atraumatic.     Right Ear: External ear normal.     Left Ear: External ear normal.     Nose: Nose normal.  Eyes:     General:        Right eye: No discharge.        Left eye: No discharge.     Conjunctiva/sclera: Conjunctivae normal.  Neck:     Thyroid : No thyromegaly.  Cardiovascular:     Rate and Rhythm: Normal rate and regular rhythm.     Heart sounds: Normal heart sounds. No murmur heard. Pulmonary:     Effort: Pulmonary effort is normal. No respiratory distress.  Breath sounds: Normal breath sounds.  Abdominal:     General: Bowel sounds are normal.     Palpations: Abdomen is soft.      Tenderness: There is no abdominal tenderness. There is no guarding.  Musculoskeletal:        General: Normal range of motion.     Cervical back: Neck supple.  Lymphadenopathy:     Cervical: No cervical adenopathy.  Skin:    General: Skin is warm and dry.  Neurological:     Mental Status: She is alert and oriented to person, place, and time.  Psychiatric:        Mood and Affect: Mood normal.        Behavior: Behavior normal.        Thought Content: Thought content normal.        Judgment: Judgment normal.    There were no vitals taken for this visit. Wt Readings from Last 3 Encounters:  11/08/23 129 lb 9.6 oz (58.8 kg)  11/05/23 129 lb 3.2 oz (58.6 kg)  10/31/23 130 lb 12.8 oz (59.3 kg)    Diabetic Foot Exam - Simple   No data filed    Lab Results  Component Value Date   WBC 5.0 12/17/2023   HGB 13.0 12/17/2023   HCT 39.1 12/17/2023   PLT 181.0 12/17/2023   GLUCOSE 105 (H) 11/05/2023   CHOL 219 (H) 02/27/2022   TRIG 116.0 02/27/2022   HDL 88.10 02/27/2022   LDLDIRECT 94.8 07/28/2010   LDLCALC 107 (H) 02/27/2022   ALT 12 11/05/2023   AST 16 11/05/2023   NA 140 11/05/2023   K 4.1 11/05/2023   CL 104 11/05/2023   CREATININE 0.60 11/05/2023   BUN 14 11/05/2023   CO2 29 11/05/2023   TSH 1.37 02/27/2022   HGBA1C 6.0 11/05/2023    Lab Results  Component Value Date   TSH 1.37 02/27/2022   Lab Results  Component Value Date   WBC 5.0 12/17/2023   HGB 13.0 12/17/2023   HCT 39.1 12/17/2023   MCV 91.7 12/17/2023   PLT 181.0 12/17/2023   Lab Results  Component Value Date   NA 140 11/05/2023   K 4.1 11/05/2023   CO2 29 11/05/2023   GLUCOSE 105 (H) 11/05/2023   BUN 14 11/05/2023   CREATININE 0.60 11/05/2023   BILITOT 0.5 11/05/2023   ALKPHOS 235 (H) 11/05/2023   AST 16 11/05/2023   ALT 12 11/05/2023   PROT 6.5 11/05/2023   ALBUMIN 4.1 10/30/2023   CALCIUM 9.1 11/05/2023   ANIONGAP 13 03/03/2020   GFR 78.74 10/30/2023   Lab Results  Component Value  Date   CHOL 219 (H) 02/27/2022   Lab Results  Component Value Date   HDL 88.10 02/27/2022   Lab Results  Component Value Date   LDLCALC 107 (H) 02/27/2022   Lab Results  Component Value Date   TRIG 116.0 02/27/2022   Lab Results  Component Value Date   CHOLHDL 2 02/27/2022   Lab Results  Component Value Date   HGBA1C 6.0 11/05/2023       Assessment & Plan:  Osteoporosis, unspecified osteoporosis type, unspecified pathological fracture presence Assessment & Plan: Encouraged to get adequate exercise, calcium and vitamin d  intake    Hyperlipidemia, unspecified hyperlipidemia type Assessment & Plan: Encourage heart healthy diet such as MIND or DASH diet, increase exercise, avoid trans fats, simple carbohydrates and processed foods, consider a krill or fish or flaxseed oil cap daily.    Disorder  of vitamin B12 Assessment & Plan: monitor   Essential hypertension, benign Assessment & Plan: Well controlled, no changes to meds. Encouraged heart healthy diet such as the DASH diet and exercise as tolerated.    Elevated blood sugar Assessment & Plan: minimize simple carbs. Increase exercise as tolerated.      Assessment and Plan Assessment & Plan Adult Wellness Visit Routine wellness visit with no major concerns. Blood work from three months ago showed elevated alkaline phosphatase and iron  levels. Iron  supplementation may have contributed to elevated iron  levels. No symptoms suggestive of underlying pathology. Discussed the importance of hydration and exercise in relation to alkaline phosphatase levels. - Will recheck alkaline phosphatase and iron  levels in late January after avoiding exercise for 3-5 days prior to testing. - Switch to taking iron  every other day. - Consider switching to a multivitamin with minerals instead of individual vitamins. - Continue calcium supplementation if dietary intake is insufficient. - Discussed the importance of hydration and  exercise in relation to alkaline phosphatase levels.  Osteoporosis Diagnosed with osteoporosis in the neck. Currently on Prolia  injections. Discussed the importance of calcium and vitamin D  supplementation for bone health. - Continue Prolia  injections. - Continue calcium and vitamin D  supplementation.  Anemia Previously anemic, now improved with iron  supplementation. Reports fatigue improved with iron . Elevated iron  levels noted, possibly due to supplementation. Discussed the potential for iron  supplementation to cause gastrointestinal side effects such as diarrhea and dark stools. - Switch to taking iron  every other day. - Will recheck iron  levels in late January.  Elevated alkaline phosphatase level Chronic elevation of alkaline phosphatase for several years. No specific symptoms or concerning findings on imaging. Possible genetic component as sister has similar findings. Discussed the nonspecific nature of alkaline phosphatase and the lack of concerning findings on imaging. - Will recheck alkaline phosphatase levels in late January after avoiding exercise for 3-5 days prior to testing.  Chronic sleep problems Difficulty falling and staying asleep. Currently using CBD gummies with some improvement. Discussed the use of CBT-I Coach app for managing insomnia, which has been shown to be effective in studies. Discussed the lack of long-term data on CBD gummies and the potential benefits of CBT-I Coach. - Use CBT-I Coach app for managing insomnia. - Consider using CBD gummies occasionally, but not daily.  Recording duration: 31 minutes     Harlene Horton, MD

## 2024-01-16 NOTE — Assessment & Plan Note (Signed)
 monitor

## 2024-01-16 NOTE — Assessment & Plan Note (Signed)
 Well controlled, no changes to meds. Encouraged heart healthy diet such as the DASH diet and exercise as tolerated.

## 2024-01-16 NOTE — Assessment & Plan Note (Signed)
 Encouraged to get adequate exercise, calcium and vitamin d intake

## 2024-01-16 NOTE — Assessment & Plan Note (Signed)
 Encourage heart healthy diet such as MIND or DASH diet, increase exercise, avoid trans fats, simple carbohydrates and processed foods, consider a krill or fish or flaxseed oil cap daily.

## 2024-01-16 NOTE — Assessment & Plan Note (Addendum)
minimize simple carbs. Increase exercise as tolerated.  

## 2024-01-17 ENCOUNTER — Encounter: Payer: Self-pay | Admitting: Family Medicine

## 2024-01-17 ENCOUNTER — Ambulatory Visit: Payer: PRIVATE HEALTH INSURANCE | Admitting: Family Medicine

## 2024-01-17 VITALS — BP 120/84 | HR 66 | Temp 97.8°F | Resp 16 | Ht 65.0 in | Wt 130.0 lb

## 2024-01-17 DIAGNOSIS — M810 Age-related osteoporosis without current pathological fracture: Secondary | ICD-10-CM | POA: Diagnosis not present

## 2024-01-17 DIAGNOSIS — R748 Abnormal levels of other serum enzymes: Secondary | ICD-10-CM | POA: Diagnosis not present

## 2024-01-17 DIAGNOSIS — R739 Hyperglycemia, unspecified: Secondary | ICD-10-CM

## 2024-01-17 DIAGNOSIS — D649 Anemia, unspecified: Secondary | ICD-10-CM

## 2024-01-17 DIAGNOSIS — E785 Hyperlipidemia, unspecified: Secondary | ICD-10-CM | POA: Diagnosis not present

## 2024-01-17 DIAGNOSIS — I10 Essential (primary) hypertension: Secondary | ICD-10-CM | POA: Diagnosis not present

## 2024-01-17 DIAGNOSIS — Z85820 Personal history of malignant melanoma of skin: Secondary | ICD-10-CM | POA: Diagnosis not present

## 2024-01-17 DIAGNOSIS — E538 Deficiency of other specified B group vitamins: Secondary | ICD-10-CM | POA: Diagnosis not present

## 2024-01-17 NOTE — Patient Instructions (Addendum)
 Prevnar 20 at pharmacy Covid vaccine annually RSV, Respiratory Syncitial Virus Vaccine, Arexvy   CBTinsomnia, APP at [employer]  All at pharmacy [facility] pharmacies are now walk in vaccine clinics M-F 9-4  Eating Plan for Osteoporosis Osteoporosis causes your bones to become weak and brittle. This puts you at greater risk for bone breaks (fractures) from small bumps or falls. Making change s to your diet and increasing your physical activity can help strengthen your bones and improve your overall health. Calcium and vitamin D  are nutrients that play an important role in bone health. Vitamin D  helps your body use calcium and strengthen bones. It is important to get enough calcium and vitamin D  as part of your eating plan for osteoporosis. What are tips for following this plan? Reading food labels Try to get at least 1,000 milligrams (mg) of calcium each day. Look for foods that have at least 50 mg of calcium per serving. Talk with your health care provider about taking a calcium supplement if you do not get enough calcium from food. Do not have more than 2,500 mg of calcium each day. This is the upper limit for food and nutritional supplements combined. Too much calcium may cause constipation and prevent you from absorbing other important nutrients. Choose foods that contain vitamin D . Take a daily vitamin supplement that contains 800-1,000 international units (IU) of vitamin D . The amount may be different depending on your age, body weight, and where you live. Talk with your dietitian or health care provider about how much vitamin D  is right for you. Avoid foods that have more than 300 mg of sodium per serving. Too much sodium can cause your body to lose calcium. Talk with your dietitian or health care provider about how much sodium you are allowed each day. Shopping Do not buy foods with added salt, including: Salted snacks. Dene. Canned soups. Canned meats. Processed  meats, such as bacon or precooked or cured meat like sausages or meat loaves. Smoked fish. Meal planning Eat balanced meals that contain protein foods, fruits and vegetables, and foods rich in calcium and vitamin D . Eat at least 5 servings of fruits and vegetables each day. Eat 5-6 oz (142-170 g) of lean meat, poultry, fish, eggs, or beans each day. Lifestyle Do not use any products that contain nicotine or tobacco, such as cigarettes, e-cigarettes, and chewing tobacco. If you need help quitting, ask your health care provider. If your health care provider recommends that you lose weight: Work with a dietitian to develop an eating plan that will help you reach your desired weight goal. Exercise for at least 30 minutes a day, 5 or more days a week, or as told by your health care provider. Work with a physical therapist to develop an exercise plan that includes flexibility, balance, and strength exercises. Do not focus only on aerobic exercise. Do not drink alcohol if: Your health care provider tells you not to drink. You are pregnant, may be pregnant, or are planning to become pregnant. If you drink alcohol: Limit how much you use to: 0-1 drink a day for women. 0-2 drinks a day for men. Be aware of how much alcohol is in your drink. In the [place] one drink equals one 12 oz bottle of beer (355 mL), one 5 oz glass of wine (148 mL), or one 1 oz glass of hard liquor (44 mL). What foods should I eat? Foods high in calcium  Yogurt. Yogurt with fruit. Milk. Evaporated skim milk. Dry milk  powder. Calcium-fortified orange juice. Parmesan cheese. Part-skim ricotta cheese. Natural hard cheese. Cream cheese. Cottage cheese. Canned sardines. Canned salmon. Calcium-treated tofu. Calcium-fortified cereal bar. Calcium-fortified cereal. Calcium-fortified graham crackers. Cooked collard greens. Turnip greens. Broccoli. Kale. Almonds. White beans. Corn tortilla. Foods high in vitamin D  Cod liver oil.  Fatty fish, such as tuna, mackerel, and salmon. Milk. Fortified soy milk. Fortified fruit juice. Yogurt. Margarine. Egg yolks. Foods high in protein Beef. Lamb. Pork tenderloin. Chicken breast. Tuna (canned). Fish fillet. Tofu. Cooked soy beans. Soy patty. Beans (canned or cooked). Cottage cheese. Yogurt. Peanut butter. Pumpkin seeds. Nuts. Sunflower seeds. Hard cheese. Milk or other milk products, such as soy milk. The items listed above may not be a complete list of foods and beverages you can eat. Contact a dietitian for more options. Summary Calcium and vitamin D  are nutrients that play an important role in bone health and are an important part of your eating plan for osteoporosis. Eat balanced meals that contain protein foods, fruits and vegetables, and foods rich in calcium and vitamin D . Avoid foods that have more than 300 mg of sodium per serving. Too much sodium can cause your body to lose calcium. Exercise is an important part of prevention and treatment of osteoporosis. Aim for at least 30 minutes a day, 5 days a week. This information is not intended to replace advice given to you by your health care provider. Make sure you discuss any questions you have with your health care provider. Document Revised: 07/10/2019 Document Reviewed: 07/10/2019 Elsevier Patient Education  2024 [employer].

## 2024-01-20 ENCOUNTER — Encounter: Payer: Self-pay | Admitting: Family Medicine

## 2024-01-20 DIAGNOSIS — Z85820 Personal history of malignant melanoma of skin: Secondary | ICD-10-CM | POA: Insufficient documentation

## 2024-01-21 ENCOUNTER — Other Ambulatory Visit (HOSPITAL_COMMUNITY): Payer: Self-pay | Admitting: Family Medicine

## 2024-02-26 ENCOUNTER — Encounter: Payer: Self-pay | Admitting: Family Medicine

## 2024-02-27 ENCOUNTER — Other Ambulatory Visit (INDEPENDENT_AMBULATORY_CARE_PROVIDER_SITE_OTHER)

## 2024-02-27 ENCOUNTER — Ambulatory Visit: Payer: Self-pay | Admitting: Family Medicine

## 2024-02-27 DIAGNOSIS — R748 Abnormal levels of other serum enzymes: Secondary | ICD-10-CM

## 2024-02-27 DIAGNOSIS — D649 Anemia, unspecified: Secondary | ICD-10-CM

## 2024-02-27 DIAGNOSIS — I10 Essential (primary) hypertension: Secondary | ICD-10-CM

## 2024-02-27 LAB — COMPREHENSIVE METABOLIC PANEL WITH GFR
ALT: 13 U/L (ref 3–35)
AST: 17 U/L (ref 5–37)
Albumin: 4.1 g/dL (ref 3.5–5.2)
Alkaline Phosphatase: 90 U/L (ref 39–117)
BUN: 15 mg/dL (ref 6–23)
CO2: 31 meq/L (ref 19–32)
Calcium: 9 mg/dL (ref 8.4–10.5)
Chloride: 102 meq/L (ref 96–112)
Creatinine, Ser: 0.78 mg/dL (ref 0.40–1.20)
GFR: 73.75 mL/min
Glucose, Bld: 99 mg/dL (ref 70–99)
Potassium: 4.3 meq/L (ref 3.5–5.1)
Sodium: 138 meq/L (ref 135–145)
Total Bilirubin: 0.6 mg/dL (ref 0.2–1.2)
Total Protein: 6.4 g/dL (ref 6.0–8.3)

## 2024-02-27 LAB — CBC WITH DIFFERENTIAL/PLATELET
Basophils Absolute: 0.1 K/uL (ref 0.0–0.1)
Basophils Relative: 1.1 % (ref 0.0–3.0)
Eosinophils Absolute: 0.1 K/uL (ref 0.0–0.7)
Eosinophils Relative: 1.3 % (ref 0.0–5.0)
HCT: 42.3 % (ref 36.0–46.0)
Hemoglobin: 14.3 g/dL (ref 12.0–15.0)
Lymphocytes Relative: 27.8 % (ref 12.0–46.0)
Lymphs Abs: 1.5 K/uL (ref 0.7–4.0)
MCHC: 33.7 g/dL (ref 30.0–36.0)
MCV: 99.3 fl (ref 78.0–100.0)
Monocytes Absolute: 0.3 K/uL (ref 0.1–1.0)
Monocytes Relative: 6.3 % (ref 3.0–12.0)
Neutro Abs: 3.3 K/uL (ref 1.4–7.7)
Neutrophils Relative %: 63.5 % (ref 43.0–77.0)
Platelets: 198 K/uL (ref 150.0–400.0)
RBC: 4.26 Mil/uL (ref 3.87–5.11)
RDW: 15.2 % (ref 11.5–15.5)
WBC: 5.3 K/uL (ref 4.0–10.5)

## 2024-02-27 NOTE — Addendum Note (Signed)
 Addended by: TRUDY CURVIN RAMAN on: 02/27/2024 01:13 PM   Modules accepted: Orders

## 2024-02-27 NOTE — Telephone Encounter (Signed)
 PT is scheduled.

## 2024-02-28 NOTE — [facility] Notes (Signed)
"  Pt reviewed via MyChart.   "

## 2024-03-01 LAB — ALKALINE PHOSPHATASE ISOENZYMES
Alkaline phosphatase (APISO): 97 U/L (ref 37–153)
Bone Isoenzymes: 31 % (ref 28–66)
Intestinal Isoenzymes: 27 % — ABNORMAL HIGH (ref 1–24)
Liver Isoenzymes: 42 % (ref 25–69)
Macrohepatic isoenzymes: 0 %
Placental isoenzymes: 0 %

## 2024-03-01 LAB — IRON,TIBC AND FERRITIN PANEL

## 2024-03-04 ENCOUNTER — Other Ambulatory Visit: Payer: Self-pay

## 2024-03-04 DIAGNOSIS — D649 Anemia, unspecified: Secondary | ICD-10-CM

## 2024-03-04 NOTE — [facility] Notes (Signed)
 Pre lab tech Iron  panel was canceled and in order it stated Test not performed.

## 2024-03-04 NOTE — [facility] Notes (Signed)
"  Pt reviewed via MyChart.   "

## 2024-05-09 ENCOUNTER — Ambulatory Visit: Payer: PRIVATE HEALTH INSURANCE

## 2024-07-21 ENCOUNTER — Ambulatory Visit: Admitting: Family Medicine

## 2024-09-03 ENCOUNTER — Ambulatory Visit: Payer: PRIVATE HEALTH INSURANCE

## 2024-11-18 ENCOUNTER — Encounter: Admitting: Family Medicine
